# Patient Record
Sex: Female | Born: 1985
Health system: Southern US, Community
[De-identification: ages and names within clinical notes are randomized; demographics above are authoritative.]

## PROBLEM LIST (undated history)

## (undated) ENCOUNTER — Inpatient Hospital Stay: Admission: EM | Payer: Self-pay | Source: Home / Self Care

## (undated) DIAGNOSIS — F419 Anxiety disorder, unspecified: Secondary | ICD-10-CM

## (undated) DIAGNOSIS — I82409 Acute embolism and thrombosis of unspecified deep veins of unspecified lower extremity: Secondary | ICD-10-CM

## (undated) DIAGNOSIS — Z8759 Personal history of other complications of pregnancy, childbirth and the puerperium: Secondary | ICD-10-CM

## (undated) DIAGNOSIS — E282 Polycystic ovarian syndrome: Secondary | ICD-10-CM

## (undated) DIAGNOSIS — N915 Oligomenorrhea, unspecified: Secondary | ICD-10-CM

## (undated) DIAGNOSIS — E669 Obesity, unspecified: Secondary | ICD-10-CM

## (undated) DIAGNOSIS — I10 Essential (primary) hypertension: Secondary | ICD-10-CM

## (undated) DIAGNOSIS — C55 Malignant neoplasm of uterus, part unspecified: Secondary | ICD-10-CM

## (undated) HISTORY — DX: Malignant neoplasm of uterus, part unspecified: C55

## (undated) HISTORY — DX: Polycystic ovarian syndrome: E28.2

## (undated) HISTORY — DX: Oligomenorrhea, unspecified: N91.5

## (undated) HISTORY — DX: Essential (primary) hypertension: I10

## (undated) HISTORY — PX: OTHER SURGICAL HISTORY: SHX169

## (undated) HISTORY — DX: Obesity, unspecified: E66.9

---
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
## 2003-08-28 ENCOUNTER — Other Ambulatory Visit: Admission: RE | Admit: 2003-08-28 | Discharge: 2003-08-28 | Payer: Self-pay | Admitting: Gynecology

## 2004-11-14 ENCOUNTER — Other Ambulatory Visit: Admission: RE | Admit: 2004-11-14 | Discharge: 2004-11-14 | Payer: Self-pay | Admitting: Gynecology

## 2005-11-16 ENCOUNTER — Other Ambulatory Visit: Admission: RE | Admit: 2005-11-16 | Discharge: 2005-11-16 | Payer: Self-pay | Admitting: Gynecology

## 2006-11-23 ENCOUNTER — Other Ambulatory Visit: Admission: RE | Admit: 2006-11-23 | Discharge: 2006-11-23 | Payer: Self-pay | Admitting: Gynecology

## 2007-12-20 ENCOUNTER — Encounter: Payer: Self-pay | Admitting: Women's Health

## 2007-12-20 ENCOUNTER — Other Ambulatory Visit: Admission: RE | Admit: 2007-12-20 | Discharge: 2007-12-20 | Payer: Self-pay | Admitting: Gynecology

## 2007-12-20 ENCOUNTER — Ambulatory Visit: Payer: Self-pay | Admitting: Women's Health

## 2008-09-05 ENCOUNTER — Emergency Department (HOSPITAL_COMMUNITY): Admission: EM | Admit: 2008-09-05 | Discharge: 2008-09-05 | Payer: Self-pay | Admitting: Emergency Medicine

## 2010-09-05 ENCOUNTER — Encounter (INDEPENDENT_AMBULATORY_CARE_PROVIDER_SITE_OTHER): Payer: BC Managed Care – PPO | Admitting: Women's Health

## 2010-09-05 ENCOUNTER — Other Ambulatory Visit (HOSPITAL_COMMUNITY)
Admission: RE | Admit: 2010-09-05 | Discharge: 2010-09-05 | Disposition: A | Discharge status: Home / Self Care | Payer: BC Managed Care – PPO | Source: Ambulatory Visit | Attending: Obstetrics and Gynecology | Admitting: Obstetrics and Gynecology

## 2010-09-05 ENCOUNTER — Other Ambulatory Visit: Payer: Self-pay | Admitting: Women's Health

## 2010-09-05 DIAGNOSIS — N949 Unspecified condition associated with female genital organs and menstrual cycle: Secondary | ICD-10-CM

## 2010-09-05 DIAGNOSIS — N926 Irregular menstruation, unspecified: Secondary | ICD-10-CM

## 2010-09-05 DIAGNOSIS — Z124 Encounter for screening for malignant neoplasm of cervix: Secondary | ICD-10-CM | POA: Insufficient documentation

## 2010-09-05 DIAGNOSIS — Z833 Family history of diabetes mellitus: Secondary | ICD-10-CM

## 2010-09-05 DIAGNOSIS — R823 Hemoglobinuria: Secondary | ICD-10-CM

## 2010-09-05 DIAGNOSIS — Z01419 Encounter for gynecological examination (general) (routine) without abnormal findings: Secondary | ICD-10-CM

## 2010-09-14 ENCOUNTER — Ambulatory Visit: Payer: BC Managed Care – PPO | Admitting: Women's Health

## 2010-09-14 ENCOUNTER — Other Ambulatory Visit: Payer: BC Managed Care – PPO

## 2010-09-20 ENCOUNTER — Ambulatory Visit (INDEPENDENT_AMBULATORY_CARE_PROVIDER_SITE_OTHER): Payer: BC Managed Care – PPO | Admitting: Gynecology

## 2010-09-20 ENCOUNTER — Other Ambulatory Visit: Payer: BC Managed Care – PPO

## 2010-09-20 ENCOUNTER — Other Ambulatory Visit: Payer: Self-pay | Admitting: Gynecology

## 2010-09-20 DIAGNOSIS — N854 Malposition of uterus: Secondary | ICD-10-CM

## 2010-09-20 DIAGNOSIS — E282 Polycystic ovarian syndrome: Secondary | ICD-10-CM

## 2010-09-20 DIAGNOSIS — N949 Unspecified condition associated with female genital organs and menstrual cycle: Secondary | ICD-10-CM

## 2010-09-20 DIAGNOSIS — N926 Irregular menstruation, unspecified: Secondary | ICD-10-CM

## 2010-09-20 DIAGNOSIS — N938 Other specified abnormal uterine and vaginal bleeding: Secondary | ICD-10-CM

## 2012-11-30 LAB — OB RESULTS CONSOLE ABO/RH: RH Type: POSITIVE

## 2012-11-30 LAB — OB RESULTS CONSOLE HEPATITIS B SURFACE ANTIGEN: Hepatitis B Surface Ag: NEGATIVE

## 2012-11-30 LAB — OB RESULTS CONSOLE HIV ANTIBODY (ROUTINE TESTING): HIV: NONREACTIVE

## 2012-11-30 LAB — OB RESULTS CONSOLE GC/CHLAMYDIA
CHLAMYDIA, DNA PROBE: NEGATIVE
Gonorrhea: NEGATIVE

## 2012-11-30 LAB — OB RESULTS CONSOLE ANTIBODY SCREEN: Antibody Screen: NEGATIVE

## 2012-11-30 LAB — OB RESULTS CONSOLE RUBELLA ANTIBODY, IGM: RUBELLA: IMMUNE

## 2012-11-30 LAB — OB RESULTS CONSOLE RPR: RPR: NONREACTIVE

## 2013-03-22 ENCOUNTER — Inpatient Hospital Stay (HOSPITAL_COMMUNITY)
Admission: AD | Admit: 2013-03-22 | Discharge: 2013-03-22 | Disposition: A | Discharge status: Home / Self Care | Payer: 59 | Source: Ambulatory Visit | Attending: Obstetrics & Gynecology | Admitting: Obstetrics & Gynecology

## 2013-03-22 ENCOUNTER — Encounter (HOSPITAL_COMMUNITY): Payer: Self-pay | Admitting: Advanced Practice Midwife

## 2013-03-22 DIAGNOSIS — R42 Dizziness and giddiness: Secondary | ICD-10-CM | POA: Insufficient documentation

## 2013-03-22 DIAGNOSIS — O99891 Other specified diseases and conditions complicating pregnancy: Secondary | ICD-10-CM | POA: Insufficient documentation

## 2013-03-22 DIAGNOSIS — K5289 Other specified noninfective gastroenteritis and colitis: Secondary | ICD-10-CM | POA: Insufficient documentation

## 2013-03-22 DIAGNOSIS — K529 Noninfective gastroenteritis and colitis, unspecified: Secondary | ICD-10-CM

## 2013-03-22 DIAGNOSIS — R002 Palpitations: Secondary | ICD-10-CM | POA: Insufficient documentation

## 2013-03-22 DIAGNOSIS — D696 Thrombocytopenia, unspecified: Secondary | ICD-10-CM | POA: Insufficient documentation

## 2013-03-22 DIAGNOSIS — D689 Coagulation defect, unspecified: Secondary | ICD-10-CM | POA: Insufficient documentation

## 2013-03-22 DIAGNOSIS — R197 Diarrhea, unspecified: Secondary | ICD-10-CM | POA: Insufficient documentation

## 2013-03-22 HISTORY — DX: Anxiety disorder, unspecified: F41.9

## 2013-03-22 LAB — CBC
HCT: 39.3 % (ref 36.0–46.0)
MCH: 27.4 pg (ref 26.0–34.0)
MCHC: 34.1 g/dL (ref 30.0–36.0)
Platelets: 135 10*3/uL — ABNORMAL LOW (ref 150–400)
RBC: 4.89 MIL/uL (ref 3.87–5.11)
RDW: 14 % (ref 11.5–15.5)

## 2013-03-22 MED ORDER — PROMETHAZINE HCL 25 MG PO TABS
25.0000 mg | ORAL_TABLET | Freq: Once | ORAL | Status: AC
  Administered 2013-03-22: 25 mg via ORAL
  Filled 2013-03-22: qty 1

## 2013-03-22 MED ORDER — ONDANSETRON 8 MG PO TBDP: 8.0000 mg | ORAL_TABLET | Freq: Once | ORAL | Status: DC

## 2013-03-22 MED ORDER — PROMETHAZINE HCL 25 MG PO TABS: 25.0000 mg | ORAL_TABLET | Freq: Four times a day (QID) | ORAL | Status: DC | PRN

## 2013-03-22 NOTE — MAU Note (Addendum)
Nausea, vomiting, diarrhea, dizziness, get hot and cold. Symptoms going on for an hour. Pt states feels like heart is racing sometimes and gets dizziness and has chest pressure.

## 2013-03-22 NOTE — MAU Provider Note (Signed)
Chief Complaint:  Emesis During Pregnancy, Diarrhea and Dizziness   First Provider Initiated Contact with Patient 03/22/13 0515     HPI: Nancy Rivera is a 27 y.o. G1P0 at [redacted]w[redacted]d who presents to maternity admissions reporting an episode of feeling rapid heartbeat, difficulty catching her breath and feeling clammy immediately preceding having simultaneous diarrhea and vomiting 1 hour before coming to MAU. Sx resolved immediately afterward. Mild return of rapid heartbeat and clammy feeling in MAU. Resolved w/ relaxation techniques and reassurance.   Hx anxiety attacks w/ some similar Sx.   Past Medical History: Past Medical History  Diagnosis Date  . Obesity   . Oligomenorrhea   . Anxiety     Past obstetric history: OB History  Gravida Para Term Preterm AB SAB TAB Ectopic Multiple Living  1             # Outcome Date GA Lbr Len/2nd Weight Sex Delivery Anes PTL Lv  1 CUR               Past Surgical History: Past Surgical History  Procedure Laterality Date  . Removal of cyst  1985    FROM UNDER LEFT EYEBROW     Family History: Family History  Problem Relation Age of Onset  . Cancer Maternal Grandfather     LUNG  . Cancer Paternal Grandfather     LYMPHOMA    Social History: History  Substance Use Topics  . Smoking status: Former Smoker -- 0.50 packs/day  . Smokeless tobacco: Not on file  . Alcohol Use: No    Allergies: No Known Allergies  Meds:  Prescriptions prior to admission  Medication Sig Dispense Refill  . calcium carbonate (TUMS EX) 750 MG chewable tablet Chew 1 tablet by mouth daily.      . prenatal vitamin w/FE, FA (NATACHEW) 29-1 MG CHEW chewable tablet Chew 1 tablet by mouth daily at 12 noon.      . medroxyPROGESTERone (PROVERA) 10 MG tablet Take 10 mg by mouth daily. X 10 DAYS OF EVERY MOS.         ROS: Pertinent findings in history of present illness. Denies contractions, leakage of fluid or vaginal bleeding. Good fetal movement. Denies fever, chills,  chest pain, SOB, sick contacts, HA, vision changes, epigastric pain.  Physical Exam  Blood pressure 105/62, pulse 81, temperature 97.9 F (36.6 C), resp. rate 20, SpO2 100.00% on RA. Pulse 80's throughout visit.  GENERAL: Well-developed, well-nourished female in no acute distress except for mild anxiety.  HEENT: normocephalic HEART: RRR, no M/R/G  RESP: normal effort. CTAB. Chest NT.  EXTREMITIES: Nontender, no edema NEURO: alert and oriented SPECULUM EXAM: Deferred  FHT: 150 by doppler   Labs: Results for orders placed during the hospital encounter of 03/22/13 (from the past 24 hour(s))  CBC     Status: Abnormal   Collection Time    03/22/13  4:55 AM      Result Value Range   WBC 13.3 (*) 4.0 - 10.5 K/uL   RBC 4.89  3.87 - 5.11 MIL/uL   Hemoglobin 13.4  12.0 - 15.0 g/dL   HCT 16.1  09.6 - 04.5 %   MCV 80.4  78.0 - 100.0 fL   MCH 27.4  26.0 - 34.0 pg   MCHC 34.1  30.0 - 36.0 g/dL   RDW 40.9  81.1 - 91.4 %   Platelets 135 (*) 150 - 400 K/uL    Imaging:  No results found.  MAU Course: Phenergan given.  No further Sx. Tolerating POs well.   Assessment: 1. Gastroenteritis   2. Thrombocytopenia complicating pregnancy, second trimester   3. Heart palpitations    Plan: Discharge home in stable condition. Push fluids.  Relaxation techniques.     Follow-up Information   Follow up with [facility] (as scheduled or as needed if symptoms worsen)    Contact information:   [address] [place] [address] [phone]      Follow up with [facility]. (As needed if symptoms worsen)    Contact information:   [address] [id number] [place] [address] [phone]       Medication List    STOP taking these medications       medroxyPROGESTERone 10 MG tablet  Commonly known as:  PROVERA      TAKE these medications       calcium carbonate 750 MG chewable  tablet  Commonly known as:  TUMS EX  Chew 1 tablet by mouth daily.     prenatal vitamin w/FE, FA 29-1 MG Chew chewable tablet  Chew 1 tablet by mouth daily at 12 noon.     promethazine 25 MG tablet  Commonly known as:  PHENERGAN  Take 1 tablet (25 mg total) by mouth every 6 (six) hours as needed for nausea or vomiting.        [place], [place] 03/22/2013 6:25 AM

## 2013-03-22 NOTE — Progress Notes (Signed)
Ivonne Andrew CNM in earlier and discussed lab results and d/c plan with pt. Written and verbal d/c instructions given and understanding voiced.

## 2013-03-27 NOTE — L&D Delivery Note (Signed)
Delivery Note I was called for the delivery and arrived in 4 minutes to a precipitously delivery attended by faculty MD.  I proceeded with the cutting of the cord after SVD viable female Apgars 9,9 over intact perineum but small vaginal laceration.  Placenta delivered spontaneously intact with 3VC. Repair with 2-0 Chromic with good support and hemostasis noted and R/V exam confirms.  PH art was sent.  [place] cord blood was not done.  Mother and baby were doing well.  EBL 300cc  Louretta Shorten, MD

## 2013-06-26 LAB — OB RESULTS CONSOLE GBS: GBS: NEGATIVE

## 2013-07-11 ENCOUNTER — Inpatient Hospital Stay (HOSPITAL_COMMUNITY)
Admission: AD | Admit: 2013-07-11 | Discharge: 2013-07-14 | DRG: 774 | Disposition: A | Discharge status: Home / Self Care | Payer: 59 | Source: Ambulatory Visit | Attending: Obstetrics and Gynecology | Admitting: Obstetrics and Gynecology

## 2013-07-11 ENCOUNTER — Encounter (HOSPITAL_COMMUNITY): Payer: Self-pay | Admitting: *Deleted

## 2013-07-11 DIAGNOSIS — O9989 Other specified diseases and conditions complicating pregnancy, childbirth and the puerperium: Secondary | ICD-10-CM

## 2013-07-11 DIAGNOSIS — Z6841 Body Mass Index (BMI) 40.0 and over, adult: Secondary | ICD-10-CM

## 2013-07-11 DIAGNOSIS — O99892 Other specified diseases and conditions complicating childbirth: Secondary | ICD-10-CM | POA: Diagnosis present

## 2013-07-11 DIAGNOSIS — IMO0002 Reserved for concepts with insufficient information to code with codable children: Principal | ICD-10-CM | POA: Diagnosis present

## 2013-07-11 DIAGNOSIS — O99344 Other mental disorders complicating childbirth: Secondary | ICD-10-CM | POA: Diagnosis present

## 2013-07-11 DIAGNOSIS — O1490 Unspecified pre-eclampsia, unspecified trimester: Secondary | ICD-10-CM | POA: Diagnosis present

## 2013-07-11 DIAGNOSIS — F341 Dysthymic disorder: Secondary | ICD-10-CM | POA: Diagnosis present

## 2013-07-11 DIAGNOSIS — O99214 Obesity complicating childbirth: Secondary | ICD-10-CM

## 2013-07-11 DIAGNOSIS — Z87891 Personal history of nicotine dependence: Secondary | ICD-10-CM

## 2013-07-11 DIAGNOSIS — Z22330 Carrier of Group B streptococcus: Secondary | ICD-10-CM

## 2013-07-11 DIAGNOSIS — E669 Obesity, unspecified: Secondary | ICD-10-CM | POA: Diagnosis present

## 2013-07-11 HISTORY — DX: Unspecified pre-eclampsia, unspecified trimester: O14.90

## 2013-07-11 LAB — CBC
HEMATOCRIT: 38.8 % (ref 36.0–46.0)
Hemoglobin: 13 g/dL (ref 12.0–15.0)
MCH: 27.9 pg (ref 26.0–34.0)
MCHC: 33.5 g/dL (ref 30.0–36.0)
MCV: 83.3 fL (ref 78.0–100.0)
Platelets: 158 10*3/uL (ref 150–400)
RBC: 4.66 MIL/uL (ref 3.87–5.11)
RDW: 14.7 % (ref 11.5–15.5)
WBC: 12.5 10*3/uL — ABNORMAL HIGH (ref 4.0–10.5)

## 2013-07-11 LAB — COMPREHENSIVE METABOLIC PANEL
ALT: 10 U/L (ref 0–35)
AST: 13 U/L (ref 0–37)
Albumin: 2.4 g/dL — ABNORMAL LOW (ref 3.5–5.2)
Alkaline Phosphatase: 104 U/L (ref 39–117)
BUN: 11 mg/dL (ref 6–23)
CO2: 23 meq/L (ref 19–32)
Calcium: 8.9 mg/dL (ref 8.4–10.5)
Chloride: 103 mEq/L (ref 96–112)
Creatinine, Ser: 1.04 mg/dL (ref 0.50–1.10)
GFR calc non Af Amer: 71 mL/min — ABNORMAL LOW (ref >90)
GFR, EST AFRICAN AMERICAN: 83 mL/min — AB (ref >90)
GLUCOSE: 76 mg/dL (ref 70–99)
Potassium: 4.1 mEq/L (ref 3.7–5.3)
Sodium: 138 mEq/L (ref 137–147)
Total Bilirubin: <0.2 mg/dL — ABNORMAL LOW (ref 0.3–1.2)
Total Protein: 6 g/dL (ref 6.0–8.3)

## 2013-07-11 LAB — URINALYSIS, ROUTINE W REFLEX MICROSCOPIC
Bilirubin Urine: NEGATIVE
GLUCOSE, UA: NEGATIVE mg/dL
Hgb urine dipstick: NEGATIVE
Ketones, ur: NEGATIVE mg/dL
LEUKOCYTES UA: NEGATIVE
Nitrite: NEGATIVE
Specific Gravity, Urine: >1.03 — ABNORMAL HIGH (ref 1.005–1.030)
UROBILINOGEN UA: 0.2 mg/dL (ref 0.0–1.0)
pH: 5.5 (ref 5.0–8.0)

## 2013-07-11 LAB — URIC ACID: URIC ACID, SERUM: 8.3 mg/dL — AB (ref 2.4–7.0)

## 2013-07-11 LAB — SYPHILIS: RPR W/REFLEX TO RPR TITER AND TREPONEMAL ANTIBODIES, TRADITIONAL SCREENING AND DIAGNOSIS ALGORITHM

## 2013-07-11 LAB — TYPE AND SCREEN
ABO/RH(D): A POS
Antibody Screen: NEGATIVE

## 2013-07-11 LAB — URINE MICROSCOPIC-ADD ON

## 2013-07-11 LAB — LACTATE DEHYDROGENASE: LDH: 132 U/L (ref 94–250)

## 2013-07-11 MED ORDER — ACETAMINOPHEN 325 MG PO TABS
650.0000 mg | ORAL_TABLET | ORAL | Status: DC | PRN
  Administered 2013-07-11: 650 mg via ORAL
  Filled 2013-07-11: qty 2

## 2013-07-11 MED ORDER — ZOLPIDEM TARTRATE 5 MG PO TABS
5.0000 mg | ORAL_TABLET | Freq: Every evening | ORAL | Status: DC | PRN
  Administered 2013-07-11: 5 mg via ORAL
  Filled 2013-07-11: qty 1

## 2013-07-11 MED ORDER — OXYTOCIN 40 UNITS IN LACTATED RINGERS INFUSION - SIMPLE MED
62.5000 mL/h | INTRAVENOUS | Status: DC
  Administered 2013-07-12: 62.5 mL/h via INTRAVENOUS

## 2013-07-11 MED ORDER — ONDANSETRON HCL 4 MG/2ML IJ SOLN: 4.0000 mg | Freq: Four times a day (QID) | INTRAMUSCULAR | Status: DC | PRN

## 2013-07-11 MED ORDER — IBUPROFEN 600 MG PO TABS
600.0000 mg | ORAL_TABLET | Freq: Four times a day (QID) | ORAL | Status: DC | PRN
Start: 2013-07-11 — End: 2013-07-12

## 2013-07-11 MED ORDER — OXYCODONE-ACETAMINOPHEN 5-325 MG PO TABS: 1.0000 | ORAL_TABLET | ORAL | Status: DC | PRN

## 2013-07-11 MED ORDER — LACTATED RINGERS IV SOLN
INTRAVENOUS | Status: DC
  Administered 2013-07-11: 17:00:00 via INTRAVENOUS

## 2013-07-11 MED ORDER — LIDOCAINE HCL (PF) 1 % IJ SOLN
30.0000 mL | INTRAMUSCULAR | Status: DC | PRN
  Administered 2013-07-12: 30 mL via SUBCUTANEOUS
  Filled 2013-07-11: qty 30

## 2013-07-11 MED ORDER — OXYTOCIN BOLUS FROM INFUSION: 500.0000 mL | INTRAVENOUS | Status: DC

## 2013-07-11 MED ORDER — MISOPROSTOL 25 MCG QUARTER TABLET
25.0000 ug | ORAL_TABLET | ORAL | Status: DC
  Administered 2013-07-11 – 2013-07-12 (×3): 25 ug via VAGINAL
  Filled 2013-07-11: qty 1
  Filled 2013-07-11: qty 0.25
  Filled 2013-07-11 (×3): qty 1
  Filled 2013-07-11 (×2): qty 0.25
  Filled 2013-07-11: qty 1

## 2013-07-11 MED ORDER — SODIUM CHLORIDE 0.9 % IV SOLN
2.0000 g | Freq: Once | INTRAVENOUS | Status: AC
  Administered 2013-07-11: 2 g via INTRAVENOUS
  Filled 2013-07-11: qty 2000

## 2013-07-11 MED ORDER — CITRIC ACID-SODIUM CITRATE 334-500 MG/5ML PO SOLN: 30.0000 mL | ORAL | Status: DC | PRN

## 2013-07-11 MED ORDER — LACTATED RINGERS IV SOLN: 500.0000 mL | INTRAVENOUS | Status: DC | PRN

## 2013-07-11 NOTE — H&P (Signed)
Nancy Rivera is a 28 y.o. female presenting for IOL due to preeclampsia. Seen in office today and sent for elevated BPs and 4+ proteinuria.  presents to maternity admissions sent from the office with elevated BP and proteinuria. She reports a mild h/a this morning which resolved with Tylenol. She denies epigastric pain or visual disturbances. She reports elevated BP 2 weeks ago with proteinuria in the office that resolved until today. She reports good fetal movement, denies LOF, vaginal bleeding, vaginal itching/burning, urinary symptoms, h/a, dizziness, n/v, or fever/chills.  . History OB History   Grav Para Term Preterm Abortions TAB SAB Ect Mult Living   1              Past Medical History  Diagnosis Date  . Obesity   . Oligomenorrhea   . Anxiety    Past Surgical History  Procedure Laterality Date  . Removal of cyst  1985    FROM UNDER LEFT EYEBROW   Family History: family history includes Cancer in her maternal grandfather and paternal grandfather. Social History:  reports that she has quit smoking. She does not have any smokeless tobacco history on file. She reports that she does not drink alcohol or use illicit drugs.   Prenatal Transfer Tool  Maternal Diabetes: No Genetic Screening: Normal Maternal Ultrasounds/Referrals: Normal Fetal Ultrasounds or other Referrals:  None Maternal Substance Abuse:  No Significant Maternal Medications:  None Significant Maternal Lab Results:  None Other Comments:  None  ROS    Blood pressure 145/90, pulse 69, temperature 98.1 F (36.7 C), temperature source Oral, resp. rate 20, height 5\' 9"  (1.753 m), weight 136.986 kg (302 lb), SpO2 99.00%. Exam Physical Exam   1/50/high DTRs 1/4 no clonus Prenatal labs: ABO, Rh:   Antibody:   Rubella:   RPR:    HBsAg:    HIV:    GBS: Negative (04/02 0000)   Assessment/Plan: IUP at term with Mild preeclampsia.  Plan IOL No CNS sxs and normal labs.  Plan cytotec tonight and pitocin  tomorrow GBS +   Luz Lex 07/11/2013, 5:23 PM

## 2013-07-11 NOTE — MAU Provider Note (Signed)
Chief Complaint:  Hypertension and Proteinuria   First Provider Initiated Contact with Patient 07/11/13 1253      HPI: Nancy Rivera is a 28 y.o. G1P0 at [redacted]w[redacted]d who presents to maternity admissions sent from the office with elevated BP and proteinuria.  She reports a mild h/a this morning which resolved with Tylenol.  She denies epigastric pain or visual disturbances.  She reports elevated BP 2 weeks ago with proteinuria in the office that resolved until today.  She reports good fetal movement, denies LOF, vaginal bleeding, vaginal itching/burning, urinary symptoms, h/a, dizziness, n/v, or fever/chills.     Past Medical History: Past Medical History  Diagnosis Date  . Obesity   . Oligomenorrhea   . Anxiety     Past obstetric history: OB History  Gravida Para Term Preterm AB SAB TAB Ectopic Multiple Living  1             # Outcome Date GA Lbr Len/2nd Weight Sex Delivery Anes PTL Lv  1 CUR               Past Surgical History: Past Surgical History  Procedure Laterality Date  . Removal of cyst  1985    FROM UNDER LEFT EYEBROW    Family History: Family History  Problem Relation Age of Onset  . Cancer Maternal Grandfather     LUNG  . Cancer Paternal Grandfather     LYMPHOMA    Social History: History  Substance Use Topics  . Smoking status: Former Smoker -- 0.50 packs/day  . Smokeless tobacco: Not on file  . Alcohol Use: No    Allergies: No Known Allergies  Meds:  Prescriptions prior to admission  Medication Sig Dispense Refill  . acetaminophen (TYLENOL) 500 MG tablet Take 1,000 mg by mouth every 6 (six) hours as needed for mild pain or headache.      . calcium carbonate (TUMS EX) 750 MG chewable tablet Chew 2 tablets by mouth daily as needed for heartburn.       . Prenatal Vit-Fe Fumarate-FA (PRENATAL MULTIVITAMIN) TABS tablet Take 1 tablet by mouth daily at 12 noon.        ROS: Pertinent findings in history of present illness.  Physical Exam  Blood pressure  140/92, pulse 82, temperature 99.5 F (37.5 C), temperature source Oral, resp. rate 20, height 5\' 9"  (1.753 m), weight 302 lb 9.6 oz (137.258 kg), SpO2 99.00%. GENERAL: Well-developed, well-nourished female in no acute distress.  HEENT: normocephalic HEART: normal rate RESP: normal effort ABDOMEN: Soft, non-tender, gravid appropriate for gestational age EXTREMITIES: Nontender, no edema NEURO: alert and oriented    FHT:  Baseline 135, moderate variability, accelerations present, no decelerations Contractions: occasional   Labs: Results for orders placed during the hospital encounter of 07/11/13 (from the past 24 hour(s))  URINALYSIS, ROUTINE W REFLEX MICROSCOPIC     Status: Abnormal   Collection Time    07/11/13 11:50 AM      Result Value Ref Range   Color, Urine AMBER (*) YELLOW   APPearance CLEAR  CLEAR   Specific Gravity, Urine >1.030 (*) 1.005 - 1.030   pH 5.5  5.0 - 8.0   Glucose, UA NEGATIVE  NEGATIVE mg/dL   Hgb urine dipstick NEGATIVE  NEGATIVE   Bilirubin Urine NEGATIVE  NEGATIVE   Ketones, ur NEGATIVE  NEGATIVE mg/dL   Protein, ur >300 (*) NEGATIVE mg/dL   Urobilinogen, UA 0.2  0.0 - 1.0 mg/dL   Nitrite NEGATIVE  NEGATIVE  Leukocytes, UA NEGATIVE  NEGATIVE  URINE MICROSCOPIC-ADD ON     Status: None   Collection Time    07/11/13 11:50 AM      Result Value Ref Range   Squamous Epithelial / LPF RARE  RARE   WBC, UA 0-2  <3 WBC/hpf   Bacteria, UA RARE  RARE   Urine-Other MUCOUS PRESENT    CBC     Status: Abnormal   Collection Time    07/11/13 12:55 PM      Result Value Ref Range   WBC 12.5 (*) 4.0 - 10.5 K/uL   RBC 4.66  3.87 - 5.11 MIL/uL   Hemoglobin 13.0  12.0 - 15.0 g/dL   HCT 38.8  36.0 - 46.0 %   MCV 83.3  78.0 - 100.0 fL   MCH 27.9  26.0 - 34.0 pg   MCHC 33.5  30.0 - 36.0 g/dL   RDW 14.7  11.5 - 15.5 %   Platelets 158  150 - 400 K/uL  COMPREHENSIVE METABOLIC PANEL     Status: Abnormal   Collection Time    07/11/13 12:55 PM      Result Value Ref  Range   Sodium 138  137 - 147 mEq/L   Potassium 4.1  3.7 - 5.3 mEq/L   Chloride 103  96 - 112 mEq/L   CO2 23  19 - 32 mEq/L   Glucose, Bld 76  70 - 99 mg/dL   BUN 11  6 - 23 mg/dL   Creatinine, Ser 1.04  0.50 - 1.10 mg/dL   Calcium 8.9  8.4 - 10.5 mg/dL   Total Protein 6.0  6.0 - 8.3 g/dL   Albumin 2.4 (*) 3.5 - 5.2 g/dL   AST 13  0 - 37 U/L   ALT 10  0 - 35 U/L   Alkaline Phosphatase 104  39 - 117 U/L   Total Bilirubin <0.2 (*) 0.3 - 1.2 mg/dL   GFR calc non Af Amer 71 (*) >90 mL/min   GFR calc Af Amer 83 (*) >90 mL/min  URIC ACID     Status: Abnormal   Collection Time    07/11/13 12:55 PM      Result Value Ref Range   Uric Acid, Serum 8.3 (*) 2.4 - 7.0 mg/dL  LACTATE DEHYDROGENASE     Status: None   Collection Time    07/11/13 12:55 PM      Result Value Ref Range   LDH 132  94 - 250 U/L     Assessment: 1. Preeclampsia     Plan: Consult Dr Corinna Capra Admit for IOL  Discussed induction with pt, options, questions answered    Medication List    ASK your doctor about these medications       acetaminophen 500 MG tablet  Commonly known as:  TYLENOL  Take 1,000 mg by mouth every 6 (six) hours as needed for mild pain or headache.     calcium carbonate 750 MG chewable tablet  Commonly known as:  TUMS EX  Chew 2 tablets by mouth daily as needed for heartburn.     prenatal multivitamin Tabs tablet  Take 1 tablet by mouth daily at 12 noon.        Fatima Blank Certified Nurse-Midwife 07/11/2013 3:43 PM

## 2013-07-11 NOTE — MAU Note (Signed)
Patient was seen in the office today and had elevated blood pressure and protein in the urine. Has heartburn pain and headaches off and on. Nausea, no vomiting. Reports fetal movement. Denies contractions, bleeding or leaking.

## 2013-07-12 ENCOUNTER — Encounter (HOSPITAL_COMMUNITY): Payer: Self-pay | Admitting: *Deleted

## 2013-07-12 ENCOUNTER — Encounter (HOSPITAL_COMMUNITY): Payer: 59 | Admitting: Anesthesiology

## 2013-07-12 ENCOUNTER — Inpatient Hospital Stay (HOSPITAL_COMMUNITY): Payer: 59 | Admitting: Anesthesiology

## 2013-07-12 LAB — CBC
HCT: 38.4 % (ref 36.0–46.0)
HEMATOCRIT: 40 % (ref 36.0–46.0)
Hemoglobin: 13.9 g/dL (ref 12.0–15.0)
Hemoglobin: 13.3 g/dL (ref 12.0–15.0)
MCH: 28.7 pg (ref 26.0–34.0)
MCH: 28.7 pg (ref 26.0–34.0)
MCHC: 34.8 g/dL (ref 30.0–36.0)
MCHC: 34.6 g/dL (ref 30.0–36.0)
MCV: 82.6 fL (ref 78.0–100.0)
MCV: 82.9 fL (ref 78.0–100.0)
PLATELETS: 175 10*3/uL (ref 150–400)
Platelets: 173 10*3/uL (ref 150–400)
RBC: 4.84 MIL/uL (ref 3.87–5.11)
RBC: 4.63 MIL/uL (ref 3.87–5.11)
RDW: 14.5 % (ref 11.5–15.5)
RDW: 14.5 % (ref 11.5–15.5)
WBC: 14.2 10*3/uL — ABNORMAL HIGH (ref 4.0–10.5)
WBC: 16.9 10*3/uL — ABNORMAL HIGH (ref 4.0–10.5)

## 2013-07-12 LAB — COMPREHENSIVE METABOLIC PANEL
ALT: 12 U/L (ref 0–35)
AST: 14 U/L (ref 0–37)
Albumin: 2.4 g/dL — ABNORMAL LOW (ref 3.5–5.2)
Alkaline Phosphatase: 125 U/L — ABNORMAL HIGH (ref 39–117)
BUN: 10 mg/dL (ref 6–23)
CO2: 19 mEq/L (ref 19–32)
Calcium: 9 mg/dL (ref 8.4–10.5)
Chloride: 101 mEq/L (ref 96–112)
Creatinine, Ser: 0.95 mg/dL (ref 0.50–1.10)
GFR calc Af Amer: >90 mL/min (ref >90)
GFR calc non Af Amer: 80 mL/min — ABNORMAL LOW (ref >90)
GLUCOSE: 87 mg/dL (ref 70–99)
POTASSIUM: 4.3 meq/L (ref 3.7–5.3)
SODIUM: 134 meq/L — AB (ref 137–147)
Total Bilirubin: 0.4 mg/dL (ref 0.3–1.2)
Total Protein: 5.6 g/dL — ABNORMAL LOW (ref 6.0–8.3)

## 2013-07-12 LAB — URIC ACID: Uric Acid, Serum: 8.7 mg/dL — ABNORMAL HIGH (ref 2.4–7.0)

## 2013-07-12 LAB — ABO/RH: ABO/RH(D): A POS

## 2013-07-12 LAB — LACTATE DEHYDROGENASE: LDH: 162 U/L (ref 94–250)

## 2013-07-12 MED ORDER — FENTANYL 2.5 MCG/ML BUPIVACAINE 1/10 % EPIDURAL INFUSION (WH - ANES)
14.0000 mL/h | INTRAMUSCULAR | Status: DC | PRN
  Filled 2013-07-12: qty 125

## 2013-07-12 MED ORDER — OXYCODONE-ACETAMINOPHEN 5-325 MG PO TABS
1.0000 | ORAL_TABLET | ORAL | Status: DC | PRN
  Administered 2013-07-12 – 2013-07-14 (×5): 1 via ORAL
  Filled 2013-07-12 (×4): qty 1
  Filled 2013-07-12: qty 2

## 2013-07-12 MED ORDER — PROMETHAZINE HCL 25 MG/ML IJ SOLN
12.5000 mg | Freq: Four times a day (QID) | INTRAMUSCULAR | Status: DC | PRN
  Administered 2013-07-12: 12.5 mg via INTRAVENOUS
  Filled 2013-07-12: qty 1

## 2013-07-12 MED ORDER — OXYTOCIN 40 UNITS IN LACTATED RINGERS INFUSION - SIMPLE MED
1.0000 m[IU]/min | INTRAVENOUS | Status: DC
  Administered 2013-07-12: 2 m[IU]/min via INTRAVENOUS
  Filled 2013-07-12: qty 1000

## 2013-07-12 MED ORDER — ZOLPIDEM TARTRATE 5 MG PO TABS: 5.0000 mg | ORAL_TABLET | Freq: Every evening | ORAL | Status: DC | PRN

## 2013-07-12 MED ORDER — TETANUS-DIPHTH-ACELL PERTUSSIS 5-2.5-18.5 LF-MCG/0.5 IM SUSP: 0.5000 mL | Freq: Once | INTRAMUSCULAR | Status: DC

## 2013-07-12 MED ORDER — TERBUTALINE SULFATE 1 MG/ML IJ SOLN: 0.2500 mg | Freq: Once | INTRAMUSCULAR | Status: DC | PRN

## 2013-07-12 MED ORDER — BENZOCAINE-MENTHOL 20-0.5 % EX AERO
1.0000 "application " | INHALATION_SPRAY | CUTANEOUS | Status: DC | PRN
  Administered 2013-07-12: 1 via TOPICAL
  Filled 2013-07-12: qty 56

## 2013-07-12 MED ORDER — IBUPROFEN 600 MG PO TABS
600.0000 mg | ORAL_TABLET | Freq: Four times a day (QID) | ORAL | Status: DC
  Administered 2013-07-12 – 2013-07-14 (×6): 600 mg via ORAL
  Filled 2013-07-12 (×7): qty 1

## 2013-07-12 MED ORDER — ONDANSETRON HCL 4 MG PO TABS: 4.0000 mg | ORAL_TABLET | ORAL | Status: DC | PRN

## 2013-07-12 MED ORDER — PHENYLEPHRINE 40 MCG/ML (10ML) SYRINGE FOR IV PUSH (FOR BLOOD PRESSURE SUPPORT)
80.0000 ug | PREFILLED_SYRINGE | INTRAVENOUS | Status: DC | PRN
  Filled 2013-07-12: qty 2

## 2013-07-12 MED ORDER — LIDOCAINE HCL (PF) 1 % IJ SOLN
INTRAMUSCULAR | Status: DC | PRN
  Administered 2013-07-12 (×2): 4 mL

## 2013-07-12 MED ORDER — PRENATAL MULTIVITAMIN CH
1.0000 | ORAL_TABLET | Freq: Every day | ORAL | Status: DC
  Administered 2013-07-13: 1 via ORAL
  Filled 2013-07-12: qty 1

## 2013-07-12 MED ORDER — SENNOSIDES-DOCUSATE SODIUM 8.6-50 MG PO TABS
2.0000 | ORAL_TABLET | ORAL | Status: DC
  Administered 2013-07-13 – 2013-07-14 (×2): 2 via ORAL
  Filled 2013-07-12 (×3): qty 2

## 2013-07-12 MED ORDER — DIPHENHYDRAMINE HCL 50 MG/ML IJ SOLN: 12.5000 mg | INTRAMUSCULAR | Status: DC | PRN

## 2013-07-12 MED ORDER — EPHEDRINE 5 MG/ML INJ
10.0000 mg | INTRAVENOUS | Status: DC | PRN
  Filled 2013-07-12: qty 2
  Filled 2013-07-12: qty 4

## 2013-07-12 MED ORDER — LACTATED RINGERS IV SOLN
500.0000 mL | Freq: Once | INTRAVENOUS | Status: AC
  Administered 2013-07-12: 500 mL via INTRAVENOUS

## 2013-07-12 MED ORDER — LANOLIN HYDROUS EX OINT: TOPICAL_OINTMENT | CUTANEOUS | Status: DC | PRN

## 2013-07-12 MED ORDER — PHENYLEPHRINE 40 MCG/ML (10ML) SYRINGE FOR IV PUSH (FOR BLOOD PRESSURE SUPPORT)
80.0000 ug | PREFILLED_SYRINGE | INTRAVENOUS | Status: DC | PRN
  Filled 2013-07-12: qty 2
  Filled 2013-07-12: qty 10

## 2013-07-12 MED ORDER — ONDANSETRON HCL 4 MG/2ML IJ SOLN: 4.0000 mg | INTRAMUSCULAR | Status: DC | PRN

## 2013-07-12 MED ORDER — MEASLES, MUMPS & RUBELLA VAC ~~LOC~~ INJ: 0.5000 mL | INJECTION | Freq: Once | SUBCUTANEOUS | Status: DC

## 2013-07-12 MED ORDER — DIPHENHYDRAMINE HCL 25 MG PO CAPS: 25.0000 mg | ORAL_CAPSULE | Freq: Four times a day (QID) | ORAL | Status: DC | PRN

## 2013-07-12 MED ORDER — BUTORPHANOL TARTRATE 1 MG/ML IJ SOLN
1.0000 mg | Freq: Once | INTRAMUSCULAR | Status: AC
  Administered 2013-07-12: 1 mg via INTRAVENOUS
  Filled 2013-07-12: qty 1

## 2013-07-12 MED ORDER — WITCH HAZEL-GLYCERIN EX PADS: 1.0000 "application " | MEDICATED_PAD | CUTANEOUS | Status: DC | PRN

## 2013-07-12 MED ORDER — MEDROXYPROGESTERONE ACETATE 150 MG/ML IM SUSP: 150.0000 mg | INTRAMUSCULAR | Status: DC | PRN

## 2013-07-12 MED ORDER — SODIUM CHLORIDE 0.9 % IV SOLN
2.0000 g | Freq: Four times a day (QID) | INTRAVENOUS | Status: DC
  Administered 2013-07-12 (×2): 2 g via INTRAVENOUS
  Filled 2013-07-12 (×4): qty 2000

## 2013-07-12 MED ORDER — EPHEDRINE 5 MG/ML INJ
10.0000 mg | INTRAVENOUS | Status: DC | PRN
  Filled 2013-07-12: qty 2

## 2013-07-12 MED ORDER — FENTANYL 2.5 MCG/ML BUPIVACAINE 1/10 % EPIDURAL INFUSION (WH - ANES)
INTRAMUSCULAR | Status: DC | PRN
  Administered 2013-07-12: 14 mL/h via EPIDURAL

## 2013-07-12 MED ORDER — FENTANYL 2.5 MCG/ML BUPIVACAINE 1/10 % EPIDURAL INFUSION (WH - ANES): 14.0000 mL/h | INTRAMUSCULAR | Status: DC | PRN

## 2013-07-12 MED ORDER — SIMETHICONE 80 MG PO CHEW: 80.0000 mg | CHEWABLE_TABLET | ORAL | Status: DC | PRN

## 2013-07-12 MED ORDER — DIBUCAINE 1 % RE OINT: 1.0000 "application " | TOPICAL_OINTMENT | RECTAL | Status: DC | PRN

## 2013-07-12 NOTE — Anesthesia Preprocedure Evaluation (Signed)
Anesthesia Evaluation  Patient identified by MRN, date of birth, ID band Patient awake    Reviewed: Allergy & Precautions, H&P , NPO status , Patient's Chart, lab work & pertinent test results  Airway Mallampati: III TM Distance: >3 FB Neck ROM: Full    Dental  (+) Dental Advisory Given   Pulmonary former smoker,          Cardiovascular hypertension, Rhythm:Regular     Neuro/Psych Anxiety negative neurological ROS  negative psych ROS   GI/Hepatic negative GI ROS, Neg liver ROS,   Endo/Other  Morbid obesity  Renal/GU negative Renal ROS     Musculoskeletal negative musculoskeletal ROS (+)   Abdominal   Peds  Hematology negative hematology ROS (+)   Anesthesia Other Findings   Reproductive/Obstetrics (+) Pregnancy                           Anesthesia Physical Anesthesia Plan  ASA: III  Anesthesia Plan: Epidural   Post-op Pain Management:    Induction:   Airway Management Planned:   Additional Equipment:   Intra-op Plan:   Post-operative Plan:   Informed Consent: I have reviewed the patients History and Physical, chart, labs and discussed the procedure including the risks, benefits and alternatives for the proposed anesthesia with the patient or authorized representative who has indicated his/her understanding and acceptance.     Plan Discussed with:   Anesthesia Plan Comments:         Anesthesia Quick Evaluation

## 2013-07-12 NOTE — Anesthesia Procedure Notes (Signed)
Epidural Patient location during procedure: OB Start time: 07/12/2013 12:36 PM End time: 07/12/2013 12:51 PM  Staffing Anesthesiologist: Nolon Nations R Performed by: anesthesiologist   Preanesthetic Checklist Completed: patient identified, pre-op evaluation, timeout performed, IV checked, risks and benefits discussed and monitors and equipment checked  Epidural Patient position: sitting Prep: site prepped and draped and DuraPrep Patient monitoring: heart rate Approach: midline Location: L2-L3 Injection technique: LOR air and LOR saline  Needle:  Needle type: Tuohy  Needle gauge: 17 G Needle length: 9 cm Needle insertion depth: 7 cm Catheter type: closed end flexible Catheter size: 19 Gauge Catheter at skin depth: 13 cm Test dose: negative  Assessment Sensory level: T8 Events: blood not aspirated, injection not painful, no injection resistance, negative IV test and no paresthesia  Additional Notes Reason for block:procedure for pain

## 2013-07-12 NOTE — Progress Notes (Signed)
Patient ID: Nancy Rivera, female   DOB: 1985-11-17, 28 y.o.   MRN: [id number] Pt without PIH sxs VSSAF  BPs 140s /90s FHR 140s Cat 1 Ctxs irregular mild  Cx 2/50/-2  AROM clear  DTRS 2/4 no clonuc  PIH - Stable Continue IOL.  Recheck labs Anticpate SVD

## 2013-07-12 NOTE — Lactation Note (Signed)
This note was copied from the chart of Girl Corey Caulfield. Lactation Consultation Note Initial visit at 4 hours of age.  Baby is in moms arms attempting latch.  Place baby STS in cross cradle, baby very fussy.  Wet diaper changed and moved to football hold.  Baby latches after a few attempts and has several strong suckling bursts and then needs stimulation to keep suckling.  Baby fussy and comes off and lays on moms breast asleep.  Baby awakens after a few minutes and re-latches without assistance.  Encouraged mom to hold breast compressed for feeding.  Baby continues to suck for a few minutes.  [place] LC resources given and discussed.  Mom is receptive to education and will call for assist as needed.      Patient Name: Girl Boyd Litaker QQPYP'P Date: 07/12/2013 Reason for consult: Initial assessment   Maternal Data Has patient been taught Hand Expression?: Yes Does the patient have breastfeeding experience prior to this delivery?: No  Feeding Feeding Type: Breast Fed Length of feed: 10 min  LATCH Score/Interventions Latch: Repeated attempts needed to sustain latch, nipple held in mouth throughout feeding, stimulation needed to elicit sucking reflex. Intervention(s): Breast compression;Assist with latch;Adjust position;Breast massage  Audible Swallowing: A few with stimulation Intervention(s): Hand expression;Skin to skin;Alternate breast massage  Type of Nipple: Everted at rest and after stimulation  Comfort (Breast/Nipple): Soft / non-tender     Hold (Positioning): Assistance needed to correctly position infant at breast and maintain latch. Intervention(s): Position options;Skin to skin;Support Pillows;Breastfeeding basics reviewed  LATCH Score: 7  Lactation Tools Discussed/Used     Consult Status Consult Status: Follow-up Date: 07/13/13 Follow-up type: In-patient    Justine Null Hagop Mccollam 07/12/2013, 10:26 PM

## 2013-07-13 LAB — CBC
HCT: 36.7 % (ref 36.0–46.0)
HEMOGLOBIN: 12.4 g/dL (ref 12.0–15.0)
MCH: 28.1 pg (ref 26.0–34.0)
MCHC: 33.8 g/dL (ref 30.0–36.0)
MCV: 83 fL (ref 78.0–100.0)
PLATELETS: 168 10*3/uL (ref 150–400)
RBC: 4.42 MIL/uL (ref 3.87–5.11)
RDW: 14.7 % (ref 11.5–15.5)
WBC: 17.1 10*3/uL — ABNORMAL HIGH (ref 4.0–10.5)

## 2013-07-13 NOTE — Anesthesia Postprocedure Evaluation (Signed)
Anesthesia Post Note  Patient: Nancy Rivera  Procedure(s) Performed: * No procedures listed *  Anesthesia type: Epidural  Patient location: Mother/Baby  Post pain: Pain level controlled  Post assessment: Post-op Vital signs reviewed  Last Vitals:  Filed Vitals:   07/13/13 0556  BP: 121/82  Pulse: 76  Temp: 36.6 C  Resp: 18    Post vital signs: Reviewed  Level of consciousness:alert  Complications: No apparent anesthesia complications

## 2013-07-13 NOTE — Progress Notes (Signed)
Clinical Social Work Department PSYCHOSOCIAL ASSESSMENT - MATERNAL/CHILD 07/13/2013  Patient:  Nancy Rivera, Nancy Rivera  Account Number:  [id number]  Admit Date:  07/11/2013  Ardine Eng Name:   Soyla Murphy    Clinical Social Worker:  Chantille Navarrete, LCSW   Date/Time:  07/13/2013 02:00 PM  Date Referred:  07/13/2013      Referred reason  Depression/Anxiety   Other referral source:    I:  FAMILY / [place] legal guardian:  PARENT  Guardian - Name Guardian - Age Guardian - Address  [place],Angelamarie [address], [place] [address]  [name]  same as above   Other household support members/support persons Other support:    II  PSYCHOSOCIAL DATA Information Source:    Occupational hygienist Employment:   Museum/gallery curator resources:  Multimedia programmer If [place]:    School / Grade:   Maternity Care Coordinator / Child Services Coordination / Early Interventions:  Cultural issues impacting care:    III  STRENGTHS Strengths  Supportive family/friends  Home prepared for Child (including basic supplies)  Adequate Resources   Strength comment:    IV  RISK FACTORS AND CURRENT PROBLEMS Current Problem:       V  SOCIAL WORK ASSESSMENT Acknowledged order for Social Work consult to assess mother's history of anxiety.  Parents are married and have no other dependents.  Parents are employed.   Mother reports extensive family support.  She reports hx of anxiety and states that she is able to manage it without medication.   Discussed  PP Depression and provided her with information on where to get help if needed.  Mother reports no current symptoms of depression or anxiety.  She also denies any hx of substance abuse.  No acute social concerns noted at this time.   Informed parents of CSW availability.      VI SOCIAL WORK PLAN Social Work Plan  No Further Intervention Required / No Barriers to Discharge

## 2013-07-13 NOTE — Progress Notes (Signed)
Post Partum Day 1 Subjective: no complaints, up ad lib, voiding and tolerating PO  Objective: Blood pressure 135/87, pulse 73, temperature 98.4 F (36.9 C), temperature source Axillary, resp. rate 18, height 5\' 9"  (1.753 m), weight 136.986 kg (302 lb), SpO2 97.00%, unknown if currently breastfeeding.  Physical Exam:  General: alert, cooperative, appears stated age and no distress Lochia: appropriate Uterine Fundus: firm Incision: healing well DVT Evaluation: No evidence of DVT seen on physical exam.   Recent Labs  07/12/13 1830 07/13/13 0540  HGB 13.3 12.4  HCT 38.4 36.7    Assessment/Plan: Plan for discharge tomorrow and Breastfeeding BPs stable.  Labs wnl   LOS: 2 days   Luz Lex 07/13/2013, 10:11 AM

## 2013-07-13 NOTE — Lactation Note (Signed)
This note was copied from the chart of Nancy Kimbra Marcelino. Lactation Consultation Note Follow up visit at 24 hours of age.  Mom reports good feedings and denies problems or concerns.  9 feedings, 4 voids, 5 stools in the past 24 hours, with latch scores of 8-9.  Encouraged mom to call for assist as needed.    Patient Name: Nancy Rivera EHUDJ'S Date: 07/13/2013 Reason for consult: Follow-up assessment   Maternal Data    Feeding Feeding Type: Breast Fed Length of feed: 60 min  LATCH Score/Interventions Latch: Grasps breast easily, tongue down, lips flanged, rhythmical sucking.  Audible Swallowing: A few with stimulation  Type of Nipple: Everted at rest and after stimulation  Comfort (Breast/Nipple): Soft / non-tender     Hold (Positioning): No assistance needed to correctly position infant at breast. (Mom says this feeding is the best latch so far.) Intervention(s): Breastfeeding basics reviewed  LATCH Score: 9  Lactation Tools Discussed/Used     Consult Status Consult Status: Follow-up Date: 07/14/13 Follow-up type: In-patient    Nancy Rivera 07/13/2013, 5:57 PM

## 2013-07-14 MED ORDER — OXYCODONE-ACETAMINOPHEN 5-325 MG PO TABS: 1.0000 | ORAL_TABLET | ORAL | Status: DC | PRN

## 2013-07-14 MED ORDER — IBUPROFEN 600 MG PO TABS: 600.0000 mg | ORAL_TABLET | Freq: Four times a day (QID) | ORAL | Status: DC

## 2013-07-14 NOTE — Lactation Note (Signed)
This note was copied from the chart of Nancy Rivera. Lactation Consultation Note  Patient Name: Nancy Rivera CWCBJ'S Date: 07/14/2013 Reason for consult: Follow-up assessment Mom reports baby is nursing well, denies concerns. Engorgement care reviewed if needed. Advised of OP services and support group.   Maternal Data    Feeding Feeding Type: Breast Fed Length of feed: 20 min  LATCH Score/Interventions                      Lactation Tools Discussed/Used     Consult Status Consult Status: Complete Date: 07/14/13 Follow-up type: In-patient    Georga Kaufmann Kerline Trahan 07/14/2013, 10:22 AM

## 2013-07-14 NOTE — Discharge Summary (Signed)
Obstetric Discharge Summary Reason for Admission: induction of labor Prenatal Procedures: ultrasound Intrapartum Procedures: spontaneous vaginal delivery Postpartum Procedures: none Complications-Operative and Postpartum: vaginal laceration Hemoglobin  Date Value Ref Range Status  07/13/2013 12.4  12.0 - 15.0 g/dL Final     HCT  Date Value Ref Range Status  07/13/2013 36.7  36.0 - 46.0 % Final    Physical Exam:  General: alert and cooperative Lochia: appropriate Uterine Fundus: firm Incision: healing well DVT Evaluation: No evidence of DVT seen on physical exam. Negative Homan's sign. No cords or calf tenderness. Calf/Ankle edema is present.  Discharge Diagnoses: Term Pregnancy-delivered  Discharge Information: Date: 07/14/2013 Activity: pelvic rest Diet: routine Medications: PNV, Ibuprofen and Percocet Condition: stable Instructions: refer to practice specific booklet Discharge to: home   Newborn Data: Live born female  Birth Weight: 6 lb 0.3 oz (2730 g) APGAR: 9, 9  Home with mother.  Nancy Rivera 07/14/2013, 8:08 AM

## 2013-07-14 NOTE — Progress Notes (Signed)
Ur chart review completed post discharge.  

## 2013-07-24 ENCOUNTER — Encounter (HOSPITAL_COMMUNITY): Payer: Self-pay | Admitting: *Deleted

## 2013-07-24 ENCOUNTER — Inpatient Hospital Stay (HOSPITAL_COMMUNITY)
Admission: AD | Admit: 2013-07-24 | Discharge: 2013-07-24 | Disposition: A | Discharge status: Home / Self Care | Payer: 59 | Source: Ambulatory Visit | Attending: Obstetrics and Gynecology | Admitting: Obstetrics and Gynecology

## 2013-07-24 DIAGNOSIS — O9089 Other complications of the puerperium, not elsewhere classified: Secondary | ICD-10-CM

## 2013-07-24 DIAGNOSIS — O99893 Other specified diseases and conditions complicating puerperium: Secondary | ICD-10-CM | POA: Insufficient documentation

## 2013-07-24 DIAGNOSIS — R109 Unspecified abdominal pain: Secondary | ICD-10-CM | POA: Insufficient documentation

## 2013-07-24 DIAGNOSIS — R519 Headache, unspecified: Secondary | ICD-10-CM

## 2013-07-24 DIAGNOSIS — O9989 Other specified diseases and conditions complicating pregnancy, childbirth and the puerperium: Principal | ICD-10-CM

## 2013-07-24 DIAGNOSIS — Z87891 Personal history of nicotine dependence: Secondary | ICD-10-CM | POA: Insufficient documentation

## 2013-07-24 DIAGNOSIS — O26899 Other specified pregnancy related conditions, unspecified trimester: Secondary | ICD-10-CM

## 2013-07-24 DIAGNOSIS — O909 Complication of the puerperium, unspecified: Secondary | ICD-10-CM

## 2013-07-24 DIAGNOSIS — R51 Headache: Secondary | ICD-10-CM | POA: Insufficient documentation

## 2013-07-24 DIAGNOSIS — R030 Elevated blood-pressure reading, without diagnosis of hypertension: Secondary | ICD-10-CM | POA: Insufficient documentation

## 2013-07-24 LAB — URINE MICROSCOPIC-ADD ON

## 2013-07-24 LAB — URINALYSIS, ROUTINE W REFLEX MICROSCOPIC
BILIRUBIN URINE: NEGATIVE
Glucose, UA: NEGATIVE mg/dL
Ketones, ur: NEGATIVE mg/dL
Nitrite: NEGATIVE
PH: 5.5 (ref 5.0–8.0)
Protein, ur: NEGATIVE mg/dL
Specific Gravity, Urine: 1.015 (ref 1.005–1.030)
Urobilinogen, UA: 0.2 mg/dL (ref 0.0–1.0)

## 2013-07-24 MED ORDER — ACETAMINOPHEN 500 MG PO TABS: 1000.0000 mg | ORAL_TABLET | Freq: Once | ORAL | Status: DC

## 2013-07-24 NOTE — Discharge Instructions (Signed)

## 2013-07-24 NOTE — MAU Provider Note (Signed)
History     CSN: [id number]  Arrival date and time: 07/24/13 1911   First Provider Initiated Contact with Patient 07/24/13 2012      No chief complaint on file.  HPI  Ms. Nancy Rivera is a 28 y.o. female [id number] who presents with concerns of high blood pressure and abdominal cramping April 18th; vaginal delivery; induced at 38 weeks for preeclampsia. Dr. Corinna Capra delivered her baby without any complications.  She was in the office on Tuesday and her pressure was elevated; since she has been home this week her BP's have been low. She complains of HA everyday since Tuesday. She has not taken anything for HA today; prior to today she had been taking ibuprofen without relief of her HA; ibuprofen has helped her abdominal cramping.  She currently rates her HA pain 5/10 Her bleeding is minimal; the color alternates from brown to red.   OB History   Grav Para Term Preterm Abortions TAB SAB Ect Mult Living   1 1 1       1       Past Medical History  Diagnosis Date  . Obesity   . Oligomenorrhea   . Anxiety     Past Surgical History  Procedure Laterality Date  . Removal of cyst  1985    FROM UNDER LEFT EYEBROW    Family History  Problem Relation Age of Onset  . Cancer Maternal Grandfather     LUNG  . Cancer Paternal Grandfather     LYMPHOMA    History  Substance Use Topics  . Smoking status: Former Smoker -- 0.50 packs/day  . Smokeless tobacco: Not on file  . Alcohol Use: No    Allergies: No Known Allergies  Prescriptions prior to admission  Medication Sig Dispense Refill  . acetaminophen (TYLENOL) 500 MG tablet Take 1,000 mg by mouth every 6 (six) hours as needed for mild pain or headache.      . docusate sodium (COLACE) 100 MG capsule Take 100 mg by mouth at bedtime.       Marland Kitchen FENUGREEK PO Take 1 capsule by mouth 3 (three) times daily.      Marland Kitchen ibuprofen (ADVIL,MOTRIN) 600 MG tablet Take 1 tablet (600 mg total) by mouth every 6 (six) hours.  30 tablet  1  .  oxyCODONE-acetaminophen (PERCOCET/ROXICET) 5-325 MG per tablet Take 1-2 tablets by mouth every 4 (four) hours as needed for severe pain (moderate - severe pain).  30 tablet  0  . Prenatal Vit-Fe Fumarate-FA (PRENATAL MULTIVITAMIN) TABS tablet Take 1 tablet by mouth at bedtime.       . ranitidine (ZANTAC) 150 MG tablet Take 150 mg by mouth daily as needed for heartburn.       Results for orders placed during the hospital encounter of 07/24/13 (from the past 48 hour(s))  URINALYSIS, ROUTINE W REFLEX MICROSCOPIC     Status: Abnormal   Collection Time    07/24/13  7:40 PM      Result Value Ref Range   Color, Urine YELLOW  YELLOW   APPearance CLEAR  CLEAR   Specific Gravity, Urine 1.015  1.005 - 1.030   pH 5.5  5.0 - 8.0   Glucose, UA NEGATIVE  NEGATIVE mg/dL   Hgb urine dipstick LARGE (*) NEGATIVE   Bilirubin Urine NEGATIVE  NEGATIVE   Ketones, ur NEGATIVE  NEGATIVE mg/dL   Protein, ur NEGATIVE  NEGATIVE mg/dL   Urobilinogen, UA 0.2  0.0 - 1.0 mg/dL   Nitrite NEGATIVE  NEGATIVE   Leukocytes, UA SMALL (*) NEGATIVE  URINE MICROSCOPIC-ADD ON     Status: Abnormal   Collection Time    07/24/13  7:40 PM      Result Value Ref Range   Squamous Epithelial / LPF RARE  RARE   WBC, UA 3-6  <3 WBC/hpf   RBC / HPF 21-50  <3 RBC/hpf   Bacteria, UA FEW (*) RARE     Review of Systems  Constitutional: Positive for chills. Negative for fever.  Eyes: Negative for blurred vision.  Respiratory: Negative for shortness of breath.   Cardiovascular: Negative for chest pain.  Gastrointestinal: Positive for abdominal pain (Bilateral lower abdominal cramping ).  Neurological: Positive for headaches. Negative for dizziness.   Physical Exam   Blood pressure 110/79, pulse 94, temperature 98.5 F (36.9 C), temperature source Oral, resp. rate 20, height 6\' 8"  (2.032 m), weight 124.002 kg (273 lb 6 oz), unknown if currently breastfeeding.  Physical Exam  Constitutional: She is oriented to person, place, and  time. She appears well-developed and well-nourished. No distress.  HENT:  Head: Normocephalic.  Eyes: Pupils are equal, round, and reactive to light.  Neck: Neck supple.  Respiratory: Effort normal.  Musculoskeletal: Normal range of motion.       Right ankle: She exhibits swelling.       Left ankle: She exhibits swelling.  Swelling in bilateral lower extremities; non pitting.   Neurological: She is alert and oriented to person, place, and time. She has normal reflexes. GCS eye subscore is 4. GCS verbal subscore is 5. GCS motor subscore is 6.  Skin: Skin is warm. She is not diaphoretic.  Psychiatric: Her behavior is normal.    MAU Course  Procedures None  MDM Negative fever  UA Spoke with Dr. Radene Knee at 2040; BP readings discussed.   Assessment and Plan   A:  HA; postpartum   P:  Discharge home in stable condition Return to MAU if symptoms worsen Continue ibuprofen and alternate with percocet Call the office tomorrow if symptoms have not improved  Darrelyn Hillock Rasch, NP  07/24/2013, 8:12 PM

## 2013-07-24 NOTE — MAU Note (Addendum)
PT SAYS SHE DEL VAG ON 4-18-  VAG-  DR LOWE-   BREASTFEEDING.     SAYS SHE HAS BAD CRAMPS IN AFTERNOON- UNRELATED TO BREASTFEEDING.  YESTERDAY SHE SAT IN SITZ BATH - HELPED - BUT TODAY IT DID NOT.    NO MED FOR PAIN.    STARTED HAVING H/A ON Tuesday-    DENIES BLURRED VISION / CHEST PAIN.   HX OF MIGRAINES- LAST TIME WAS 3-4 MTHS AGO .   WAS INDUCED FOR HYPERTENSION-   148/98.-  NO H/A WHILE IN LABOR.     CALLED OFFICE TOLD TO COME HERE.  BP AT HOME YESTERDAY- 132/82.  ON Tuesday BP AT OFFICE -  132/90.     WHILE IN LABOR-  SAID LABS WERE SOME HIGH-    SENT HOME-  NO MEDS.

## 2014-01-09 ENCOUNTER — Other Ambulatory Visit: Payer: Self-pay

## 2014-01-26 ENCOUNTER — Encounter (HOSPITAL_COMMUNITY): Payer: Self-pay | Admitting: *Deleted

## 2014-06-24 ENCOUNTER — Ambulatory Visit
Admission: RE | Admit: 2014-06-24 | Discharge: 2014-06-24 | Disposition: A | Discharge status: Home / Self Care | Payer: 59 | Source: Ambulatory Visit | Attending: Family Medicine | Admitting: Family Medicine

## 2014-06-24 ENCOUNTER — Other Ambulatory Visit: Payer: Self-pay | Admitting: Family Medicine

## 2014-06-24 DIAGNOSIS — M549 Dorsalgia, unspecified: Secondary | ICD-10-CM

## 2014-07-02 ENCOUNTER — Ambulatory Visit: Payer: 59 | Attending: Family Medicine

## 2014-07-02 DIAGNOSIS — M545 Low back pain, unspecified: Secondary | ICD-10-CM

## 2014-07-02 DIAGNOSIS — R6889 Other general symptoms and signs: Secondary | ICD-10-CM | POA: Diagnosis not present
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
## 2014-07-02 NOTE — Patient Instructions (Signed)
Perform all exercises below:  Hold _20___ seconds. Repeat _3___ times.  Do __3__ sessions per day. CAUTION: Movement should be gentle, steady and slow.  Knee to Chest  Lying supine, bend involved knee to chest. Perform with each leg.  Lumbar Rotation: Caudal - Bilateral (Supine)  Feet and knees together, arms outstretched, rotate knees left, turning head in opposite direction, until stretch is felt.    Marland KitchenHIP: Hamstrings - Short Sitting   Rest leg on raised surface. Keep knee straight. Lift chest. Hold _20__ seconds. _3__ reps per set, __2-3_ sets per day, ___ days per week  Copyright  VHI. All rights reserved.  Cervico-Thoracic: Extension / Rotation (Sitting)   Reach across body with left arm and grasp back of chair. Gently look over right side shoulder. Hold 20____ seconds. Relax. Repeat _3___ times per set. Do __1__ sets per session. Do _2-3___ sessions per day.  http://orth.exer.us/981   Copyright  VHI. All rights reserved.

## 2014-07-09 ENCOUNTER — Encounter: Payer: Self-pay | Admitting: Physical Therapy

## 2014-07-09 ENCOUNTER — Ambulatory Visit: Payer: 59 | Admitting: Physical Therapy

## 2014-07-09 ENCOUNTER — Ambulatory Visit: Payer: 59

## 2014-07-09 DIAGNOSIS — M545 Low back pain, unspecified: Secondary | ICD-10-CM

## 2014-07-09 DIAGNOSIS — R6889 Other general symptoms and signs: Secondary | ICD-10-CM

## 2014-07-09 NOTE — Therapy (Signed)
[facility] [address] [place], [place], [address] Phone: [phone]   Fax:  [phone]  Physical Therapy Treatment  Patient Details  Name: Nancy Rivera MRN: [id number] Date of Birth: [birth year] Referring Provider:  London Pepper, MD  Encounter Date: 07/09/2014      PT End of Session - 07/09/14 1246    Visit Number 2   Date for PT Re-Evaluation 08/27/14   PT Start Time 1229   PT Stop Time 1315   PT Time Calculation (min) 46 min   Activity Tolerance Patient tolerated treatment well   Behavior During Therapy [facility] for tasks assessed/performed      Past Medical History  Diagnosis Date  . Obesity   . Oligomenorrhea   . Anxiety     Past Surgical History  Procedure Laterality Date  . Removal of cyst  1985    FROM UNDER LEFT EYEBROW    There were no vitals filed for this visit.  Visit Diagnosis:  Bilateral low back pain without sciatica  Activity intolerance      Subjective Assessment - 07/09/14 1240    How long can you stand comfortably? 30-40 minutes- need to sit                       [facility] Adult PT Treatment/Exercise - 07/09/14 0001    Lumbar Exercises: Stretches   Active Hamstring Stretch 3 reps;20 seconds   Single Knee to Chest Stretch 3 reps;20 seconds   Lower Trunk Rotation 3 reps;20 seconds   Lumbar Exercises: Aerobic   Stationary Bike L1 x 6 min   Lumbar Exercises: Seated   Other Seated Lumbar Exercises thoracolumbar rotation 3x20 seconds   Lumbar Exercises: Supine   Other Supine Lumbar Exercises TA activation finding pelvic floor x 10 with 5 sec hold, ER unilateral, marching, single leg slide each x 5                PT Education - 07/09/14 1245    Education provided Yes   Education Details lifting principles and TA activation   Methods Explanation;Handout;Demonstration   Comprehension Verbalized understanding;Returned demonstration          PT Short  Term Goals - 07/02/14 1248    PT SHORT TERM GOAL #1   Title be independent with initial HEP   Time 4   Period Weeks   Status New   PT SHORT TERM GOAL #2   Title report < or = to 4/10 LBP after walking for exercise   Time 4   Period Weeks   Status New   PT SHORT TERM GOAL #3   Title report a 30% reduction in LBP with standing to cook a meal   Time 4   Period Weeks   Status New           PT Long Term Goals - 07/02/14 1212    PT LONG TERM GOAL #1   Title be independent in advanced HEP   Time 8   Period Weeks   Status New   PT LONG TERM GOAL #2   Title reduce FOTO to < or = to 18% limitation   Time 8   Period Weeks   Status New   PT LONG TERM GOAL #3   Title report < or = to 2/10 LBP after walking for exercise   Time 8   Period Weeks   Status New   PT LONG TERM GOAL #4  Title report a 75% reduction in LBP with standing to cook a meal   Time 8   Period Weeks   Status New   PT LONG TERM GOAL #5   Title verbalize and demonstrate correct body mechanics for care of her child and household tasks to reduce lumbar strain   Time 8   Period Weeks   Status New               Plan - 07/09/14 1246    PT Next Visit Plan Deep abdominal strength   PT Home Exercise Plan review bodymechanics   Consulted and Agree with Plan of Care Patient        Problem List Patient Active Problem List   Diagnosis Date Noted  . Preeclampsia 07/11/2013    NAUMANN-HOUEGNIFIO,Danyel Tobey PTA 07/09/2014, 1:08 PM  [facility] [address], [place], [place], [address] Phone: [phone]   Fax:  [phone]

## 2014-07-09 NOTE — Patient Instructions (Addendum)
 Lifting Principles  .Maintain proper posture and head alignment. .Slide object as close as possible before lifting. .Move obstacles out of the way. .Test before lifting; ask for help if too heavy. .Tighten stomach muscles without holding breath. .Use smooth movements; do not jerk. .Use legs to do the work, and pivot with feet. .Distribute the work load symmetrically and close to the center of trunk. .Push instead of pull whenever possible.   Squat down and hold basket close to stand. Use leg muscles to do the work.    Avoid twisting or bending back. Pivot around using foot movements, and bend at knees if needed when reaching for articles.        Getting Into / Out of Bed   Lower self to lie down on one side by raising legs and lowering head at the same time. Use arms to assist moving without twisting. Bend both knees to roll onto back if desired. To sit up, start from lying on side, and use same move-ments in reverse. Keep trunk aligned with legs.    Shift weight from front foot to back foot as item is lifted off shelf.    When leaning forward to pick object up from floor, extend one leg out behind. Keep back straight. Hold onto a sturdy support with other hand.      Sit upright, head facing forward. Try using a roll to support lower back. Keep shoulders relaxed, and avoid rounded back. Keep hips level with knees. Avoid crossing legs for long periods.    Lower abdominal/core stability exercises  1. Practice your breathing technique: Inhale through your nose expanding your belly and rib cage. Try not to breathe into your chest. Exhale slowly and gradually out your mouth feeling a sense of softness to your body. Practice multiple times. This can be performed unlimited.  2. Finding the lower abdominals. Laying on your back with the knees bent, place your fingers just below your belly button. Using your breathing technique from above, on your exhale gently pull the  belly button away from your fingertips without tensing any other muscles. Practice this 5x. Next, as you exhale, draw belly button inwards and hold onto it...then feel as if you are pulling that muscle across your pelvis like you are tightening a belt. This can be hard to do at first so be patient and practice. Do 5-10 reps 1-3 x day. Always recognize quality over quantity; if your abdominal muscles become tired you will notice you may tighten/contract other muscles. This is the time to take a break.   Practice this first laying on your back, then in sitting, progressing to standing and finally adding it to all your daily movements.   3. Finding your pelvic floor. Using the breathing technique above, when your exhale, this time draw your pelvic floor muscles up as if you were attempting to stop the flow of urination. Be careful NOT to tense any other muscles. This can be hard, BE PATIENT. Try to hold up to 10 seconds repeating 10x. Try 2x a day. Once you feel you are doing this well, add this contraction to exercise #2. First contracting your pelvic floor followed by lower abdominals.  4. Adding leg movements. Add the following leg movements to challenge your ability to keep your core stable:  1. Single leg drop outs: Laying on your back with knees bent feet flat. Inhale,  dropping one knee outward KEEPING YOUR PELVIS STILL. Exhale as you bring the leg back, simultaneously performing   your lower abdominal contraction. Do 5-10 on each leg.  2. Marching: While keeping your pelvis still, lift the right foot a few inches, put it down then lift left foot. This will mimic a march. Start slow to establish control. Once you have control you may speed it up. Do 10-20x. You MUST keep your lower abdominlas contracted while you march. Breathe naturally   3. Single leg slides: Inhale while you slowly slide one leg out keeping your pelvis still. Only slide your leg as far as you can keep your pelvis still. Exhale as you  bring the leg back to the start, contracting the lower abdominals as you do that. Keep your upper body relaxed. Do 5-10 on each side. 

## 2014-07-13 ENCOUNTER — Encounter: Payer: Self-pay | Admitting: Physical Therapy

## 2014-07-13 ENCOUNTER — Ambulatory Visit: Payer: 59 | Admitting: Physical Therapy

## 2014-07-13 DIAGNOSIS — M545 Low back pain, unspecified: Secondary | ICD-10-CM

## 2014-07-13 DIAGNOSIS — R6889 Other general symptoms and signs: Secondary | ICD-10-CM

## 2014-07-13 NOTE — Therapy (Signed)
[facility] [address], [place], [place], [address] Phone: [phone]   Fax:  [phone]  Physical Therapy Treatment  Patient Details  Name: Nancy Rivera MRN: [id number] Date of Birth: 01/25/1986 Referring Provider:  London Pepper, MD  Encounter Date: 07/13/2014      PT End of Session - 07/13/14 1607    Visit Number 3   Date for PT Re-Evaluation 08/27/14   PT Start Time 3329   PT Stop Time 5188   PT Time Calculation (min) 45 min   Activity Tolerance Patient tolerated treatment well   Behavior During Therapy [facility] for tasks assessed/performed      Past Medical History  Diagnosis Date  . Obesity   . Oligomenorrhea   . Anxiety     Past Surgical History  Procedure Laterality Date  . Removal of cyst  1985    FROM UNDER LEFT EYEBROW    There were no vitals filed for this visit.  Visit Diagnosis:  Bilateral low back pain without sciatica  Activity intolerance      Subjective Assessment - 07/13/14 1535    Limitations Walking;Standing  pt was able to walk for an hour pushing the stroller, but  needed advil after to help with the pain   How long can you stand comfortably? standing tolerance improved to 45 min    How long can you walk comfortably? 40-45 minutes for exercise.  Pain after this activity   Currently in Pain? No/denies   Multiple Pain Sites No                         [facility] Adult PT Treatment/Exercise - 07/13/14 0001    Lumbar Exercises: Stretches   Active Hamstring Stretch 3 reps;20 seconds  in supine with 20 sec hold   Single Knee to Chest Stretch 3 reps;20 seconds   Double Knee to Chest Stretch 3 reps;20 seconds   Lower Trunk Rotation 3 reps;20 seconds   Lumbar Exercises: Aerobic   Stationary Bike L1 x 6 min   UBE (Upper Arm Bike) L3 x 34min   Lumbar Exercises: Seated   Other Seated Lumbar Exercises thoracolumbar rotation 3x20 seconds   Lumbar Exercises: Supine   Bridge 20 reps;5 seconds   Other Supine Lumbar Exercises TA activation finding pelvic floor x 10 with 5 sec hold, ER unilateral, marching, single leg slide each x 5                  PT Short Term Goals - 07/13/14 1553    PT SHORT TERM GOAL #1   Title be independent with initial HEP   Time 4   Period Weeks   Status On-going   PT SHORT TERM GOAL #2   Title report < or = to 4/10 LBP after walking for exercise   Time 4   Period Weeks   Status On-going   PT SHORT TERM GOAL #3   Title report a 30% reduction in LBP with standing to cook a meal   Time 4   Period Weeks   Status Achieved           PT Long Term Goals - 07/13/14 1554    PT LONG TERM GOAL #1   Title be independent in advanced HEP   Time 8   Period Weeks   Status On-going   PT LONG TERM GOAL #2   Title reduce FOTO to < or = to 18% limitation  Time 8   Period Weeks   Status On-going   PT LONG TERM GOAL #3   Title report < or = to 2/10 LBP after walking for exercise   Time 8   Period Weeks   Status On-going   PT LONG TERM GOAL #4   Title report a 75% reduction in LBP with standing to cook a meal   Time 8   Period Weeks   Status Achieved   PT LONG TERM GOAL #5   Title verbalize and demonstrate correct body mechanics for care of her child and household tasks to reduce lumbar strain   Time 8   Period Weeks   Status Achieved               Plan - 07/13/14 1607    Clinical Impression Statement Pt reports she was able to walk with the stroller for an hour, but needed Advil after to help with the discomfort. Now able to cook a meal without pain   Pt will benefit from skilled therapeutic intervention in order to improve on the following deficits Decreased activity tolerance   Rehab Potential Good   PT Frequency 2x / week   PT Duration 8 weeks   PT Treatment/Interventions Moist Heat;ADLs/Self Care Home Management;Therapeutic activities;Patient/family education;Therapeutic  exercise;Ultrasound;Manual techniques;Cryotherapy;Electrical Stimulation   PT Next Visit Plan may add prayer stretch, review abdominal strength   Consulted and Agree with Plan of Care Patient        Problem List Patient Active Problem List   Diagnosis Date Noted  . Preeclampsia 07/11/2013    NAUMANN-HOUEGNIFIO,Keigan Girten PTA 07/13/2014, 4:18 PM  [facility] [address] [place], [place], [address] Phone: [phone]   Fax:  [phone]

## 2014-07-16 ENCOUNTER — Encounter: Payer: Self-pay | Admitting: Physical Therapy

## 2014-07-16 ENCOUNTER — Ambulatory Visit: Payer: 59 | Admitting: Physical Therapy

## 2014-07-16 DIAGNOSIS — R6889 Other general symptoms and signs: Secondary | ICD-10-CM

## 2014-07-16 DIAGNOSIS — M545 Low back pain, unspecified: Secondary | ICD-10-CM
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
## 2014-07-16 NOTE — Patient Instructions (Signed)
Baby Bridges   Exhaling, lift hips. Hold for _1__ breaths. Exhaling, relax hips. Exhaling, lift hips and waist. Hold for _1__ breaths. Exhaling, relax waist and hips. Exhaling, lift hips, waist, and rib cage. Hold for ___ breaths. Exhaling, relax rib cage, waist, and hips. Repeat sequence _20__ times. Do _1__ times per day. Child can roll ball under hips Copyright  VHI. All rights reserved.  Upper Body Extension (All-Fours)   Raise right arm in front. Do not arch neck. Be sure to keep back flat. Repeat _10___ times per set. Do 1____ sets per session. Do __1__ sessions per day.  http://orth.exer.us/108   Copyright  VHI. All rights reserved.  Hip Extension (All-Fours)  Slide heel back.  Do not lift foot Lift right leg back with knee slightly flexed. Do not arch neck or back. Repeat _10___ times per set. Do __1__ sets per session. Do __1__ sessions per day.  http://orth.exer.us/106   Copyright  VHI. All rights reserved.  Patient able to return demonstration correctly with above exercises.

## 2014-07-21 ENCOUNTER — Ambulatory Visit: Payer: 59 | Admitting: Physical Therapy

## 2014-07-21 ENCOUNTER — Encounter: Payer: Self-pay | Admitting: Physical Therapy

## 2014-07-21 DIAGNOSIS — M545 Low back pain, unspecified: Secondary | ICD-10-CM

## 2014-07-21 DIAGNOSIS — R6889 Other general symptoms and signs: Secondary | ICD-10-CM

## 2014-07-21 NOTE — Therapy (Signed)
[facility] [address], [place], [place], [address] Phone: [phone]   Fax:  [phone]  Physical Therapy Treatment  Patient Details  Name: Nancy Rivera MRN: [id number] Date of Birth: 1985-11-17 Referring Provider:  London Pepper, MD  Encounter Date: 07/21/2014      PT End of Session - 07/21/14 1447    Visit Number 5   Date for PT Re-Evaluation 08/27/14   PT Start Time 1400   PT Stop Time 1440   PT Time Calculation (min) 40 min   Activity Tolerance Patient tolerated treatment well   Behavior During Therapy [facility] for tasks assessed/performed      Past Medical History  Diagnosis Date  . Obesity   . Oligomenorrhea   . Anxiety     Past Surgical History  Procedure Laterality Date  . Removal of cyst  1985    FROM UNDER LEFT EYEBROW    There were no vitals filed for this visit.  Visit Diagnosis:  Bilateral low back pain without sciatica  Activity intolerance      Subjective Assessment - 07/21/14 1408    Subjective Felt good after last visit. Still hard to lift extremity in quadruped.    How long can you stand comfortably? standing tolerance improved to 60 min    How long can you walk comfortably? 1 hour   Diagnostic tests x-ray: normal   Patient Stated Goals reduce back pain with standing and walking long periods   Currently in Pain? No/denies                         [facility] Adult PT Treatment/Exercise - 07/21/14 0001    Posture/Postural Control   Posture Comments Instruction on correct posture on the plane, sit with lumbar roll, sit on aisle seat to get up during the flight   Lumbar Exercises: Aerobic   UBE (Upper Arm Bike) L4x4 min  sit on physioball   Lumbar Exercises: Standing   Functional Squats Limitations 10x with core contraction   Shoulder Extension Strengthening;Both;20 reps;Theraband   Theraband Level (Shoulder Extension) Level 3 (Green)   Other Standing Lumbar  Exercises go up and down steps forward and laterally 5 times with verbal cues on keeping pelvic leveled   Other Standing Lumbar Exercises pushup on counter 10x with abdominal bracing   Lumbar Exercises: Seated   Other Seated Lumbar Exercises horizontal abd with red band 20 x , diagonals green band 20x    Lumbar Exercises: Prone   Other Prone Lumbar Exercises modified PLANK hold 10 sed 1x                PT Education - 07/21/14 1430    Education provided Yes   Education Details back strengthening with green theraband and squats, modified PLANK, Stairs with keeping pelvic leveld, countertop push-ups, correct sitting posture on plane   Person(s) Educated Patient   Methods Explanation;Demonstration;Tactile cues;Verbal cues;Handout   Comprehension Returned demonstration;Verbalized understanding          PT Short Term Goals - 07/21/14 1409    PT SHORT TERM GOAL #1   Title be independent with initial HEP   Time 4   Period Weeks   Status Achieved   PT SHORT TERM GOAL #2   Title report < or = to 4/10 LBP after walking for exercise   Time 4   Period Weeks   Status Achieved  1/10 pain level   PT SHORT TERM GOAL #  3   Title report a 30% reduction in LBP with standing to cook a meal   Period Weeks   Status Achieved  80%           PT Long Term Goals - 07/21/14 1411    PT LONG TERM GOAL #1   Title be independent in advanced HEP   Time 8   Period Weeks   Status On-going   PT LONG TERM GOAL #3   Title report < or = to 2/10 LBP after walking for exercise   Time 8   Period Weeks   Status Achieved  1/10   PT LONG TERM GOAL #4   Title report a 75% reduction in LBP with standing to cook a meal   Time 8   Period Weeks   Status Achieved  80% better   PT LONG TERM GOAL #5   Title verbalize and demonstrate correct body mechanics for care of her child and household tasks to reduce lumbar strain   Time 8   Period Weeks   Status Achieved               Plan -  07/21/14 1447    Pt will benefit from skilled therapeutic intervention in order to improve on the following deficits Decreased activity tolerance   Rehab Potential Good   PT Frequency 2x / week   PT Duration 8 weeks   PT Treatment/Interventions Moist Heat;ADLs/Self Care Home Management;Therapeutic activities;Patient/family education;Therapeutic exercise;Ultrasound;Manual techniques;Cryotherapy;Electrical Stimulation   PT Home Exercise Plan current HEP   Recommended Other Services None   Consulted and Agree with Plan of Care Patient        Problem List Patient Active Problem List   Diagnosis Date Noted  . Preeclampsia 07/11/2013    Christen Bedoya,PT 07/21/2014, 2:49 PM  [facility] [address] [place], [place], [address] Phone: [phone]   Fax:  [phone]

## 2014-07-21 NOTE — Patient Instructions (Signed)
  PNF Strengthening: Resisted   Standing with resistive band around each hand, bring right arm up and away, thumb back. Repeat _10___ times per set. Do _2___ sets per session. Do _1-2___ sessions per day.      Resisted Horizontal Abduction: Bilateral   Sit or stand, tubing in both hands, arms out in front. Keeping arms straight, pinch shoulder blades together and stretch arms out. Repeat _10___ times per set. Do 2____ sets per session. Do _1-2___ sessions per day.                  Scapular Retraction: Elbow Flexion (Standing)   With elbows bent to 90, pinch shoulder blades together and rotate arms out, keeping elbows bent.Hold 5 sec.  Repeat _10___ times per set. Do _1___ sets per session. Do many____ sessions per day.    Strengthening: Resisted Extension   Hold tubing in right hand, arm forward. Pull arm back, elbow straight. Repeat _10___ times per set. Do _2___ sets per session. Do _1-2___ sessions per day.  Can place band around the front of your body and perform with both arms at the same time.    Squat: Pelvic and Feet Neutral   Stand with feet shoulder width apart. Squat, bending knees and hips. Keep pelvis neutral. Do _10__ times, _1__ times per day.  http://ss.exer.us/138   Copyright  VHI. All rights reserved.  Patient able to return demonstration with above exercises.

## 2014-07-24 ENCOUNTER — Encounter: Payer: 59 | Admitting: Physical Therapy

## 2014-08-03 ENCOUNTER — Encounter: Payer: Self-pay | Admitting: Physical Therapy

## 2014-08-03 ENCOUNTER — Ambulatory Visit: Payer: 59 | Attending: Family Medicine | Admitting: Physical Therapy

## 2014-08-03 DIAGNOSIS — M545 Low back pain, unspecified: Secondary | ICD-10-CM

## 2014-08-03 DIAGNOSIS — R6889 Other general symptoms and signs: Secondary | ICD-10-CM | POA: Insufficient documentation

## 2014-08-03 NOTE — Therapy (Signed)
[facility] [address], [place], [place], [address] Phone: [phone]   Fax:  [phone]  Physical Therapy Treatment  Patient Details  Name: Nancy Rivera MRN: [id number] Date of Birth: [birth year] Referring Provider:  London Pepper, MD  Encounter Date: 08/03/2014      PT End of Session - 08/03/14 1557    Visit Number 6   Date for PT Re-Evaluation 08/27/14   PT Start Time 6168   PT Stop Time 1600   PT Time Calculation (min) 29 min   Activity Tolerance Patient tolerated treatment well   Behavior During Therapy [facility] for tasks assessed/performed      Past Medical History  Diagnosis Date  . Obesity   . Oligomenorrhea   . Anxiety     Past Surgical History  Procedure Laterality Date  . Removal of cyst  1985    FROM UNDER LEFT EYEBROW    There were no vitals filed for this visit.  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   Currently in Pain? No/denies            [facility] PT Assessment - 08/03/14 0001    Observation/Other Assessments   Focus on Therapeutic Outcomes (FOTO)  2% limited                     [facility] Adult PT Treatment/Exercise - 08/03/14 0001    Lumbar Exercises: Aerobic   UBE (Upper Arm Bike) Sitting on green ball L2 3x3    Lumbar Exercises: Standing   Other Standing Lumbar Exercises  Green band scapular unattached  ex 10x ea   Other Standing Lumbar Exercises pushup on counter 10x with abdominal bracing   Lumbar Exercises: Sidelying   Other Sidelying Lumbar Exercises modified plank Bil  x10 sec   Lumbar Exercises: Quadruped   Plank Modified 2 x15 sec                   PT Short Term Goals - 07/21/14 1409    PT SHORT TERM GOAL #1   Title be independent with initial HEP   Time 4   Period Weeks   Status  Achieved   PT SHORT TERM GOAL #2   Title report < or = to 4/10 LBP after walking for exercise   Time 4   Period Weeks   Status Achieved  1/10 pain level   PT SHORT TERM GOAL #3   Title report a 30% reduction in LBP with standing to cook a meal   Period Weeks   Status Achieved  80%           PT Long Term Goals - 08/03/14 1541    PT LONG TERM GOAL #1   Title be independent in advanced HEP   Time 8   Period Weeks   Status Achieved   PT LONG TERM GOAL #2   Title reduce FOTO to < or = to 18% limitation   Time 8   Period Weeks   Status Achieved  2%   PT LONG TERM GOAL #3   Title report < or = to 2/10 LBP after walking for exercise   Period Weeks   Status Achieved   PT LONG TERM GOAL #4   Title report a 75% reduction in LBP  with standing to cook a meal   Time 8   Period Weeks   Status Achieved               Plan - 08/03/14 1557    Clinical Impression Statement Patient satisfied with her current level of function. Pt reports no pain with anything. All goals met.   Pt will benefit from skilled therapeutic intervention in order to improve on the following deficits Decreased activity tolerance   Rehab Potential Good   PT Frequency 2x / week   PT Duration 8 weeks   PT Treatment/Interventions Moist Heat;ADLs/Self Care Home Management;Therapeutic activities;Patient/family education;Therapeutic exercise;Ultrasound;Manual techniques;Cryotherapy;Electrical Stimulation   PT Next Visit Plan DC patient   Consulted and Agree with Plan of Care Patient        Problem List Patient Active Problem List   Diagnosis Date Noted  . Preeclampsia 07/11/2013    Hayleigh Bawa, PT 08/03/2014, 5:17 PM  [facility] [address], [place], [place], [address] Phone: [phone]   Fax:  [phone]  PHYSICAL THERAPY DISCHARGE SUMMARY  Visits from Start of Care: 6 Current functional level related to goals / functional  outcomes: See above note for goal update. Patient has met all of her goals.    Remaining deficits: None   Education / Equipment: HEP Plan: Patient agrees to discharge.  Patient goals were met. Patient is being discharged due to meeting the stated rehab goals.  Thank you for the referral. Earlie Counts, PT 08/03/2014 5:17 PM ?????

## 2014-08-03 NOTE — Therapy (Signed)
[facility] [address] [place], [place], [address] Phone: [phone]   Fax:  [phone]  Physical Therapy Treatment  Patient Details  Name: Nancy Rivera MRN: [id number] Date of Birth: [birth year] Referring Provider:  London Pepper, MD  Encounter Date: 08/03/2014      PT End of Session - 08/03/14 1557    Visit Number 6   Date for PT Re-Evaluation 08/27/14   PT Start Time 3536   PT Stop Time 1600   PT Time Calculation (min) 29 min   Activity Tolerance Patient tolerated treatment well   Behavior During Therapy [facility] for tasks assessed/performed      Past Medical History  Diagnosis Date  . Obesity   . Oligomenorrhea   . Anxiety     Past Surgical History  Procedure Laterality Date  . Removal of cyst  1985    FROM UNDER LEFT EYEBROW    There were no vitals filed for this visit.  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   Currently in Pain? No/denies            [facility] PT Assessment - 08/03/14 0001    Observation/Other Assessments   Focus on Therapeutic Outcomes (FOTO)  2% limited                     [facility] Adult PT Treatment/Exercise - 08/03/14 0001    Lumbar Exercises: Aerobic   UBE (Upper Arm Bike) Sitting on green ball L2 3x3    Lumbar Exercises: Standing   Other Standing Lumbar Exercises  Green band scapular unattached  ex 10x ea   Other Standing Lumbar Exercises pushup on counter 10x with abdominal bracing   Lumbar Exercises: Sidelying   Other Sidelying Lumbar Exercises modified plank Bil  x10 sec   Lumbar Exercises: Quadruped   Plank Modified 2 x15 sec                   PT Short Term Goals - 07/21/14 1409    PT SHORT TERM GOAL #1   Title be independent with initial HEP   Time 4   Period Weeks   Status  Achieved   PT SHORT TERM GOAL #2   Title report < or = to 4/10 LBP after walking for exercise   Time 4   Period Weeks   Status Achieved  1/10 pain level   PT SHORT TERM GOAL #3   Title report a 30% reduction in LBP with standing to cook a meal   Period Weeks   Status Achieved  80%           PT Long Term Goals - 08/03/14 1541    PT LONG TERM GOAL #1   Title be independent in advanced HEP   Time 8   Period Weeks   Status Achieved   PT LONG TERM GOAL #2   Title reduce FOTO to < or = to 18% limitation   Time 8   Period Weeks   Status Achieved  2%   PT LONG TERM GOAL #3   Title report < or = to 2/10 LBP after walking for exercise   Period Weeks   Status Achieved   PT LONG TERM GOAL #4   Title report a 75% reduction in LBP  with standing to cook a meal   Time 8   Period Weeks   Status Achieved               Plan - 08/03/14 1557    Clinical Impression Statement Patient satisfied with her current level of function. Pt reports no pain with anything. All goals met.   Pt will benefit from skilled therapeutic intervention in order to improve on the following deficits Decreased activity tolerance   Rehab Potential Good   PT Frequency 2x / week   PT Duration 8 weeks   PT Treatment/Interventions Moist Heat;ADLs/Self Care Home Management;Therapeutic activities;Patient/family education;Therapeutic exercise;Ultrasound;Manual techniques;Cryotherapy;Electrical Stimulation   PT Next Visit Plan DC patient   Consulted and Agree with Plan of Care Patient        Problem List Patient Active Problem List   Diagnosis Date Noted  . Preeclampsia 07/11/2013    Camille Thau, PTA 08/03/2014, 4:00 PM  [facility] [address] [place], [place], [address] Phone: [phone]   Fax:  [phone]

## 2014-08-05 ENCOUNTER — Encounter: Payer: 59 | Admitting: Physical Therapy

## 2014-09-01 ENCOUNTER — Other Ambulatory Visit: Payer: Self-pay | Admitting: Obstetrics & Gynecology

## 2014-09-02 LAB — CYTOLOGY - PAP

## 2016-06-01 DIAGNOSIS — G478 Other sleep disorders: Secondary | ICD-10-CM | POA: Diagnosis not present

## 2016-06-01 DIAGNOSIS — R0681 Apnea, not elsewhere classified: Secondary | ICD-10-CM | POA: Diagnosis not present

## 2016-06-01 DIAGNOSIS — G4719 Other hypersomnia: Secondary | ICD-10-CM | POA: Diagnosis not present

## 2016-06-01 DIAGNOSIS — N915 Oligomenorrhea, unspecified: Secondary | ICD-10-CM | POA: Diagnosis not present

## 2016-06-01 DIAGNOSIS — I10 Essential (primary) hypertension: Secondary | ICD-10-CM | POA: Diagnosis not present

## 2016-06-01 DIAGNOSIS — R0683 Snoring: Secondary | ICD-10-CM | POA: Diagnosis not present

## 2016-06-23 DIAGNOSIS — G4733 Obstructive sleep apnea (adult) (pediatric): Secondary | ICD-10-CM | POA: Diagnosis not present

## 2016-06-26 DIAGNOSIS — G4733 Obstructive sleep apnea (adult) (pediatric): Secondary | ICD-10-CM | POA: Diagnosis not present

## 2016-07-05 DIAGNOSIS — G4733 Obstructive sleep apnea (adult) (pediatric): Secondary | ICD-10-CM | POA: Diagnosis not present

## 2016-08-04 DIAGNOSIS — G4733 Obstructive sleep apnea (adult) (pediatric): Secondary | ICD-10-CM | POA: Diagnosis not present

## 2016-09-04 DIAGNOSIS — G4733 Obstructive sleep apnea (adult) (pediatric): Secondary | ICD-10-CM | POA: Diagnosis not present

## 2016-09-19 ENCOUNTER — Ambulatory Visit
Admission: RE | Admit: 2016-09-19 | Discharge: 2016-09-19 | Disposition: A | Discharge status: Home / Self Care | Payer: BLUE CROSS/BLUE SHIELD | Source: Ambulatory Visit | Attending: Family Medicine | Admitting: Family Medicine

## 2016-09-19 ENCOUNTER — Other Ambulatory Visit: Payer: Self-pay | Admitting: Family Medicine

## 2016-09-19 DIAGNOSIS — R200 Anesthesia of skin: Secondary | ICD-10-CM | POA: Diagnosis not present

## 2016-09-19 DIAGNOSIS — M25572 Pain in left ankle and joints of left foot: Principal | ICD-10-CM

## 2016-09-19 DIAGNOSIS — G8929 Other chronic pain: Secondary | ICD-10-CM

## 2016-09-19 DIAGNOSIS — S99912A Unspecified injury of left ankle, initial encounter: Secondary | ICD-10-CM | POA: Diagnosis not present

## 2016-09-20 ENCOUNTER — Ambulatory Visit
Admission: RE | Admit: 2016-09-20 | Discharge: 2016-09-20 | Disposition: A | Discharge status: Home / Self Care | Payer: BLUE CROSS/BLUE SHIELD | Source: Ambulatory Visit | Attending: Family Medicine | Admitting: Family Medicine

## 2016-09-20 ENCOUNTER — Other Ambulatory Visit: Payer: Self-pay | Admitting: Family Medicine

## 2016-09-20 ENCOUNTER — Other Ambulatory Visit: Payer: BLUE CROSS/BLUE SHIELD

## 2016-09-20 DIAGNOSIS — R52 Pain, unspecified: Secondary | ICD-10-CM

## 2016-09-20 DIAGNOSIS — S99922A Unspecified injury of left foot, initial encounter: Secondary | ICD-10-CM | POA: Diagnosis not present

## 2016-09-20 DIAGNOSIS — N912 Amenorrhea, unspecified: Secondary | ICD-10-CM | POA: Diagnosis not present

## 2016-09-20 DIAGNOSIS — M25572 Pain in left ankle and joints of left foot: Secondary | ICD-10-CM | POA: Diagnosis not present

## 2016-09-25 DIAGNOSIS — G4733 Obstructive sleep apnea (adult) (pediatric): Secondary | ICD-10-CM | POA: Diagnosis not present

## 2016-10-04 DIAGNOSIS — G4733 Obstructive sleep apnea (adult) (pediatric): Secondary | ICD-10-CM | POA: Diagnosis not present

## 2016-10-05 DIAGNOSIS — H00025 Hordeolum internum left lower eyelid: Secondary | ICD-10-CM | POA: Diagnosis not present

## 2016-10-09 DIAGNOSIS — Z6841 Body Mass Index (BMI) 40.0 and over, adult: Secondary | ICD-10-CM | POA: Diagnosis not present

## 2016-10-09 DIAGNOSIS — Z01419 Encounter for gynecological examination (general) (routine) without abnormal findings: Secondary | ICD-10-CM | POA: Diagnosis not present

## 2016-11-01 DIAGNOSIS — I10 Essential (primary) hypertension: Secondary | ICD-10-CM | POA: Diagnosis not present

## 2016-11-01 DIAGNOSIS — Z0000 Encounter for general adult medical examination without abnormal findings: Secondary | ICD-10-CM | POA: Diagnosis not present

## 2016-11-04 DIAGNOSIS — G4733 Obstructive sleep apnea (adult) (pediatric): Secondary | ICD-10-CM | POA: Diagnosis not present

## 2016-11-13 DIAGNOSIS — R197 Diarrhea, unspecified: Secondary | ICD-10-CM | POA: Diagnosis not present

## 2016-11-14 DIAGNOSIS — R197 Diarrhea, unspecified: Secondary | ICD-10-CM | POA: Diagnosis not present

## 2016-11-16 DIAGNOSIS — K529 Noninfective gastroenteritis and colitis, unspecified: Secondary | ICD-10-CM | POA: Diagnosis not present

## 2016-11-17 ENCOUNTER — Other Ambulatory Visit: Payer: Self-pay | Admitting: Family Medicine

## 2016-11-17 DIAGNOSIS — N183 Chronic kidney disease, stage 3 unspecified: Secondary | ICD-10-CM

## 2016-11-21 ENCOUNTER — Ambulatory Visit
Admission: RE | Admit: 2016-11-21 | Discharge: 2016-11-21 | Disposition: A | Discharge status: Home / Self Care | Payer: BLUE CROSS/BLUE SHIELD | Source: Ambulatory Visit | Attending: Family Medicine | Admitting: Family Medicine

## 2016-11-21 DIAGNOSIS — N183 Chronic kidney disease, stage 3 unspecified: Secondary | ICD-10-CM

## 2016-11-24 DIAGNOSIS — N979 Female infertility, unspecified: Secondary | ICD-10-CM | POA: Diagnosis not present

## 2016-12-05 DIAGNOSIS — G4733 Obstructive sleep apnea (adult) (pediatric): Secondary | ICD-10-CM | POA: Diagnosis not present

## 2017-01-04 DIAGNOSIS — G4733 Obstructive sleep apnea (adult) (pediatric): Secondary | ICD-10-CM | POA: Diagnosis not present

## 2017-02-19 DIAGNOSIS — I10 Essential (primary) hypertension: Secondary | ICD-10-CM | POA: Diagnosis not present

## 2017-02-28 DIAGNOSIS — N979 Female infertility, unspecified: Secondary | ICD-10-CM | POA: Diagnosis not present

## 2017-03-09 DIAGNOSIS — N979 Female infertility, unspecified: Secondary | ICD-10-CM | POA: Diagnosis not present

## 2017-03-16 DIAGNOSIS — N979 Female infertility, unspecified: Secondary | ICD-10-CM | POA: Diagnosis not present

## 2017-04-13 DIAGNOSIS — N979 Female infertility, unspecified: Secondary | ICD-10-CM | POA: Diagnosis not present

## 2017-06-08 DIAGNOSIS — N979 Female infertility, unspecified: Secondary | ICD-10-CM | POA: Diagnosis not present

## 2017-06-12 DIAGNOSIS — Z3149 Encounter for other procreative investigation and testing: Secondary | ICD-10-CM | POA: Diagnosis not present

## 2017-06-12 DIAGNOSIS — Z6841 Body Mass Index (BMI) 40.0 and over, adult: Secondary | ICD-10-CM | POA: Diagnosis not present

## 2017-06-15 DIAGNOSIS — N289 Disorder of kidney and ureter, unspecified: Secondary | ICD-10-CM | POA: Diagnosis not present

## 2017-06-15 DIAGNOSIS — I10 Essential (primary) hypertension: Secondary | ICD-10-CM | POA: Diagnosis not present

## 2017-06-15 DIAGNOSIS — Z720 Tobacco use: Secondary | ICD-10-CM | POA: Diagnosis not present

## 2017-06-17 DIAGNOSIS — M25519 Pain in unspecified shoulder: Secondary | ICD-10-CM | POA: Diagnosis not present

## 2017-06-26 DIAGNOSIS — N979 Female infertility, unspecified: Secondary | ICD-10-CM | POA: Diagnosis not present

## 2017-06-29 DIAGNOSIS — N289 Disorder of kidney and ureter, unspecified: Secondary | ICD-10-CM | POA: Diagnosis not present

## 2017-07-12 DIAGNOSIS — N979 Female infertility, unspecified: Secondary | ICD-10-CM | POA: Diagnosis not present

## 2017-07-19 DIAGNOSIS — N979 Female infertility, unspecified: Secondary | ICD-10-CM | POA: Diagnosis not present

## 2017-09-19 DIAGNOSIS — H66003 Acute suppurative otitis media without spontaneous rupture of ear drum, bilateral: Secondary | ICD-10-CM | POA: Diagnosis not present

## 2017-09-19 DIAGNOSIS — J019 Acute sinusitis, unspecified: Secondary | ICD-10-CM | POA: Diagnosis not present

## 2017-10-15 DIAGNOSIS — Z01419 Encounter for gynecological examination (general) (routine) without abnormal findings: Secondary | ICD-10-CM | POA: Diagnosis not present

## 2017-10-15 DIAGNOSIS — Z3200 Encounter for pregnancy test, result unknown: Secondary | ICD-10-CM | POA: Diagnosis not present

## 2017-10-15 DIAGNOSIS — Z6841 Body Mass Index (BMI) 40.0 and over, adult: Secondary | ICD-10-CM | POA: Diagnosis not present

## 2017-11-01 DIAGNOSIS — E669 Obesity, unspecified: Secondary | ICD-10-CM | POA: Diagnosis not present

## 2017-11-01 DIAGNOSIS — Z720 Tobacco use: Secondary | ICD-10-CM | POA: Diagnosis not present

## 2017-11-01 DIAGNOSIS — R7989 Other specified abnormal findings of blood chemistry: Secondary | ICD-10-CM | POA: Diagnosis not present

## 2017-11-01 DIAGNOSIS — I129 Hypertensive chronic kidney disease with stage 1 through stage 4 chronic kidney disease, or unspecified chronic kidney disease: Secondary | ICD-10-CM | POA: Diagnosis not present

## 2017-11-05 DIAGNOSIS — R7989 Other specified abnormal findings of blood chemistry: Secondary | ICD-10-CM | POA: Diagnosis not present

## 2017-11-15 DIAGNOSIS — I10 Essential (primary) hypertension: Secondary | ICD-10-CM | POA: Diagnosis not present

## 2017-11-15 DIAGNOSIS — Z0000 Encounter for general adult medical examination without abnormal findings: Secondary | ICD-10-CM | POA: Diagnosis not present

## 2018-03-14 DIAGNOSIS — J069 Acute upper respiratory infection, unspecified: Secondary | ICD-10-CM | POA: Diagnosis not present

## 2018-03-14 DIAGNOSIS — R51 Headache: Secondary | ICD-10-CM | POA: Diagnosis not present

## 2018-05-01 DIAGNOSIS — G4733 Obstructive sleep apnea (adult) (pediatric): Secondary | ICD-10-CM | POA: Diagnosis not present

## 2018-06-04 DIAGNOSIS — J0100 Acute maxillary sinusitis, unspecified: Secondary | ICD-10-CM | POA: Diagnosis not present

## 2018-10-16 DIAGNOSIS — J988 Other specified respiratory disorders: Secondary | ICD-10-CM | POA: Diagnosis not present

## 2018-10-16 DIAGNOSIS — R6883 Chills (without fever): Secondary | ICD-10-CM | POA: Diagnosis not present

## 2018-10-16 DIAGNOSIS — J019 Acute sinusitis, unspecified: Secondary | ICD-10-CM | POA: Diagnosis not present

## 2018-11-29 DIAGNOSIS — Z0000 Encounter for general adult medical examination without abnormal findings: Secondary | ICD-10-CM | POA: Diagnosis not present

## 2018-12-11 DIAGNOSIS — E669 Obesity, unspecified: Secondary | ICD-10-CM | POA: Diagnosis not present

## 2018-12-11 DIAGNOSIS — I10 Essential (primary) hypertension: Secondary | ICD-10-CM | POA: Diagnosis not present

## 2019-02-05 DIAGNOSIS — H9201 Otalgia, right ear: Secondary | ICD-10-CM | POA: Diagnosis not present

## 2019-03-04 DIAGNOSIS — Z87891 Personal history of nicotine dependence: Secondary | ICD-10-CM | POA: Diagnosis not present

## 2019-03-04 DIAGNOSIS — F411 Generalized anxiety disorder: Secondary | ICD-10-CM | POA: Diagnosis not present

## 2019-03-14 DIAGNOSIS — Z01419 Encounter for gynecological examination (general) (routine) without abnormal findings: Secondary | ICD-10-CM | POA: Diagnosis not present

## 2019-03-14 DIAGNOSIS — I10 Essential (primary) hypertension: Secondary | ICD-10-CM | POA: Diagnosis present

## 2019-03-14 DIAGNOSIS — Z6841 Body Mass Index (BMI) 40.0 and over, adult: Secondary | ICD-10-CM | POA: Diagnosis not present

## 2019-03-28 IMAGING — US US RENAL
1 series · 14 of 25 positions shown · non-contrast
Comparison: None.

CLINICAL DATA: Chronic kidney disease, stage III

EXAM:
RENAL / URINARY TRACT ULTRASOUND COMPLETE

[Series 1: us renal · 0.25mm/px · 14 of 35 slices shown]
[im 1/35]
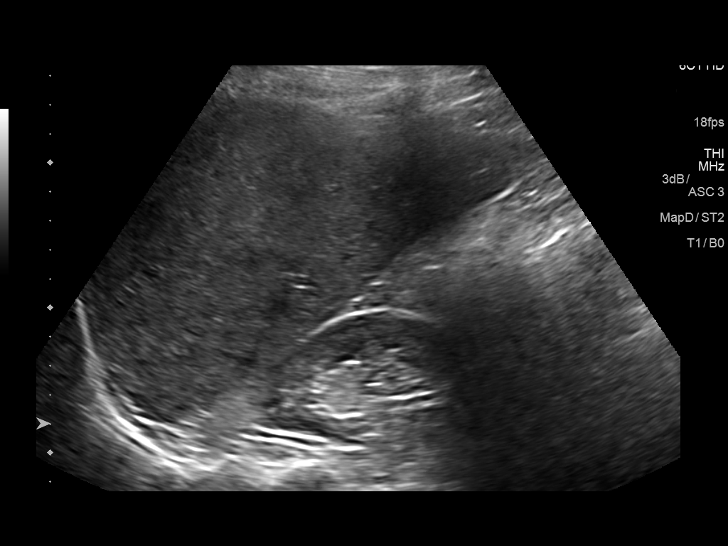
[im 3/35]
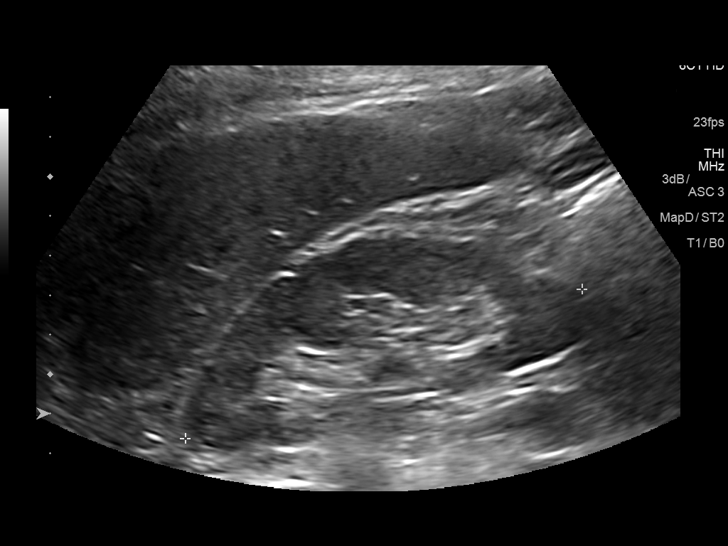
[im 6/35]
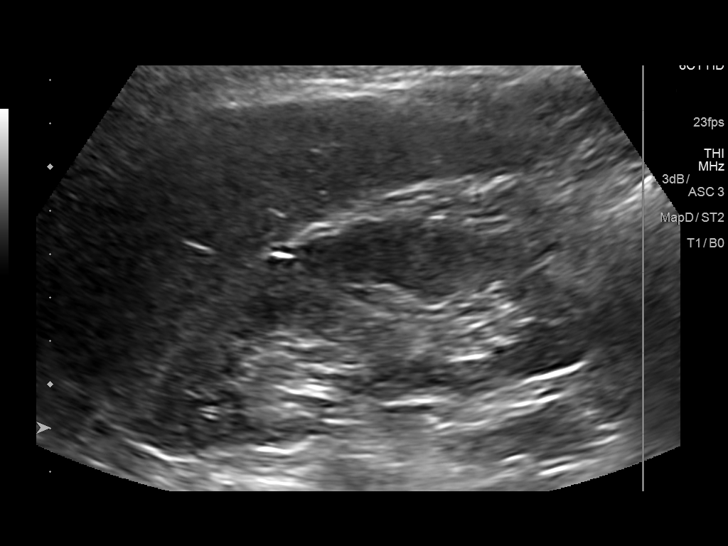
[im 9/35]
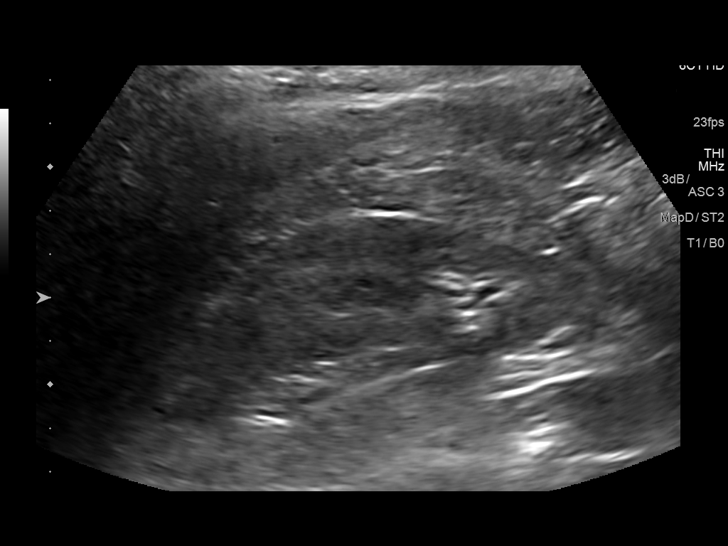
[im 12/35]
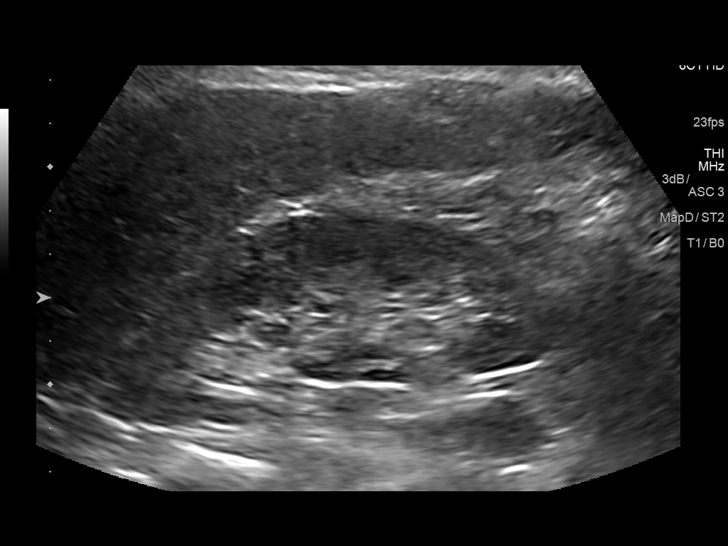
[im 13/35]
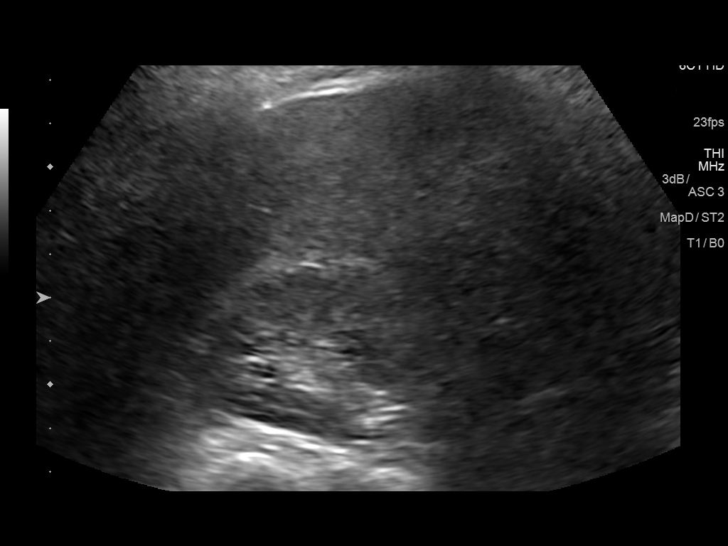
[im 16/35]
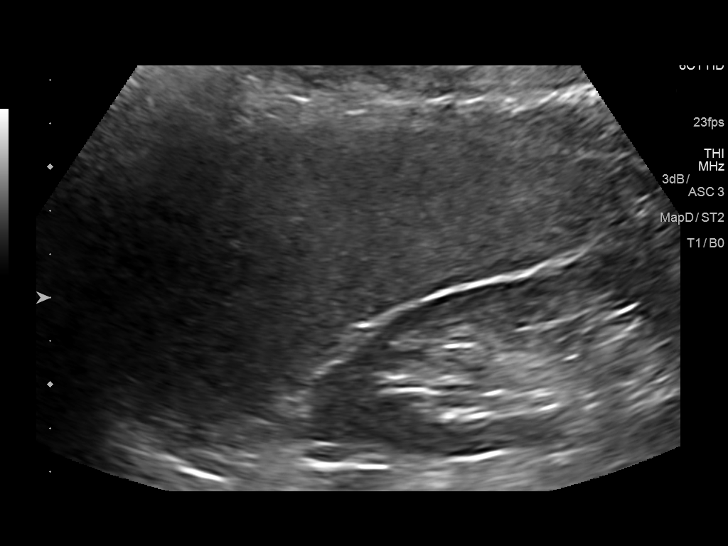
[im 19/35]
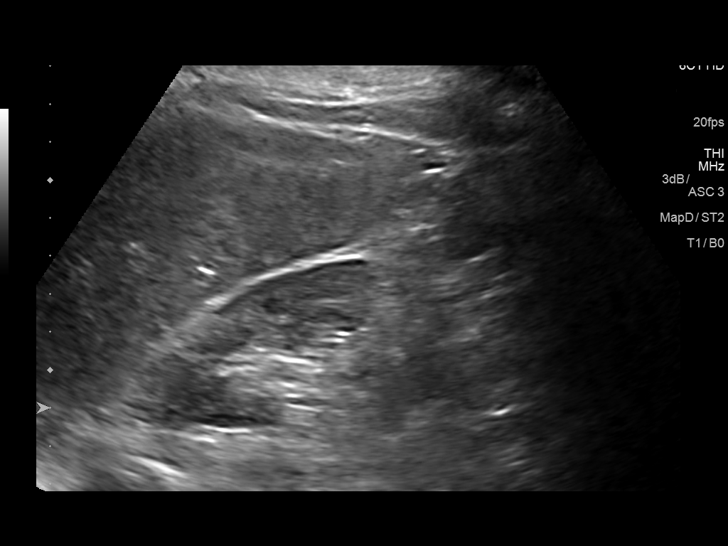
[im 22/35]
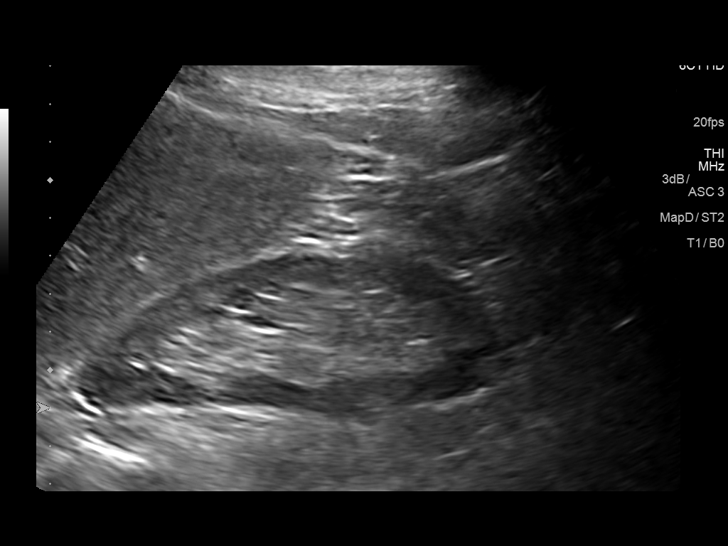
[im 23/35]
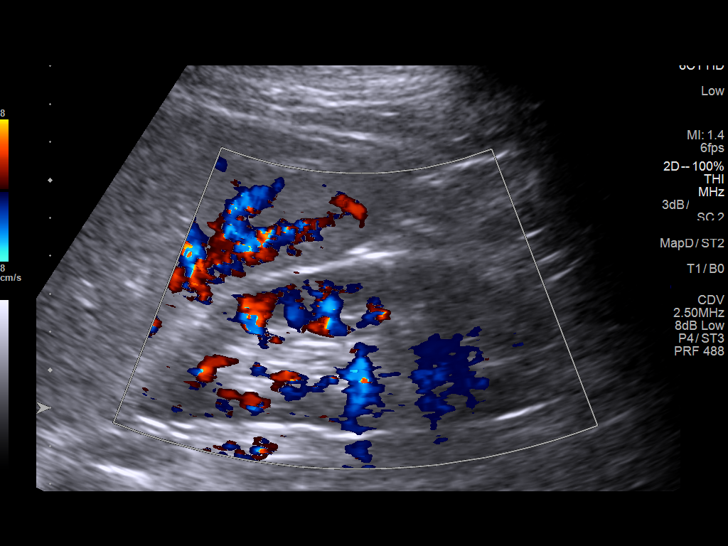
[im 26/35]
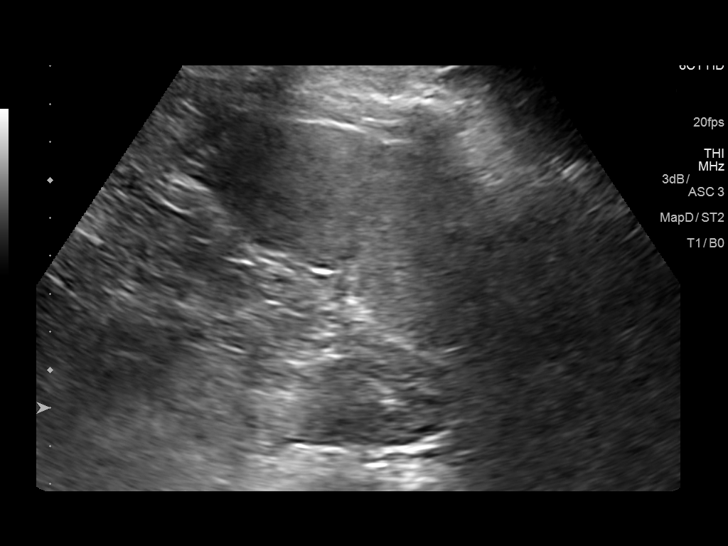
[im 29/35]
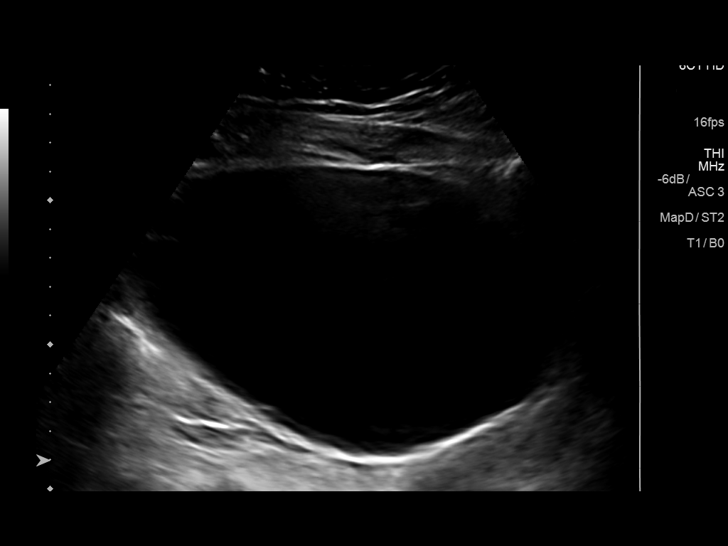
[im 32/35]
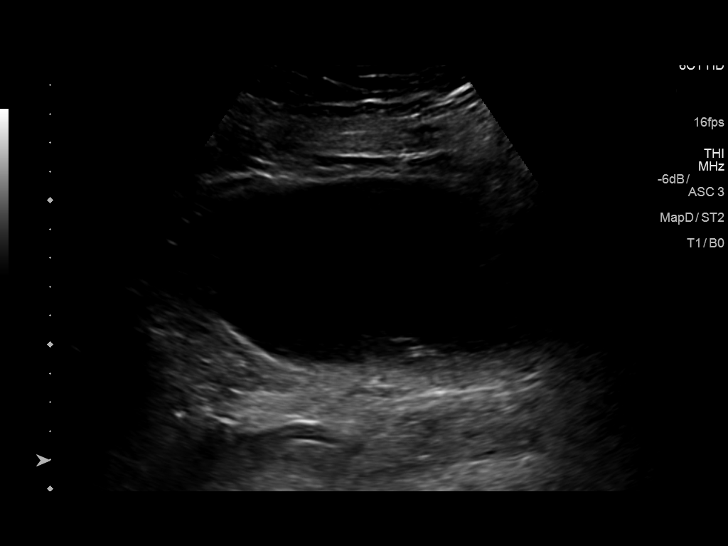
[im 35/35]
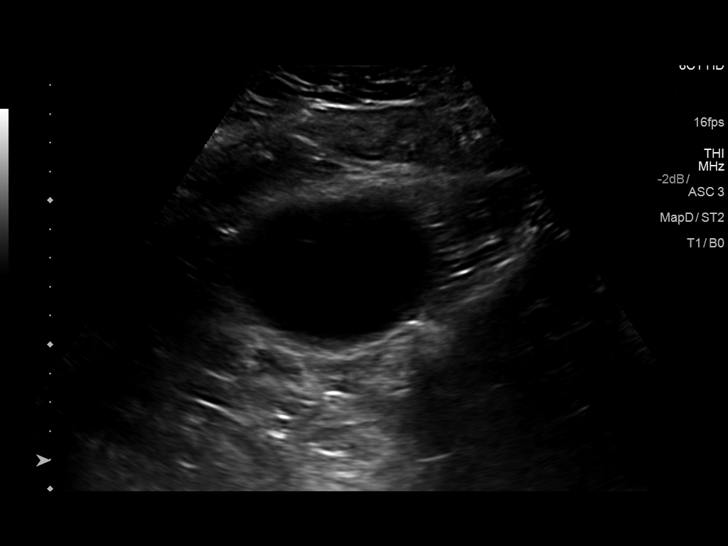

[14 of 25 positions shown; findings below may reference images not displayed]

FINDINGS: Right Kidney:

Length: 10.7 cm.. No hydronephrosis is seen. The echogenicity of the
renal parenchyma appears normal.

Left Kidney:

Length: 11.8 cm.. No hydronephrosis is noted. The left renal
parenchymal echogenicity also is normal.

Bladder:

The urinary bladder is moderately distended with no abnormality
noted.
IMPRESSION: No hydronephrosis. The echogenicity of the renal parenchyma appears
normal bilaterally.

## 2019-05-12 DIAGNOSIS — Z20828 Contact with and (suspected) exposure to other viral communicable diseases: Secondary | ICD-10-CM | POA: Diagnosis not present

## 2019-05-12 DIAGNOSIS — Z03818 Encounter for observation for suspected exposure to other biological agents ruled out: Secondary | ICD-10-CM | POA: Diagnosis not present

## 2019-05-15 DIAGNOSIS — Z20828 Contact with and (suspected) exposure to other viral communicable diseases: Secondary | ICD-10-CM | POA: Diagnosis not present

## 2019-05-15 DIAGNOSIS — Z03818 Encounter for observation for suspected exposure to other biological agents ruled out: Secondary | ICD-10-CM | POA: Diagnosis not present

## 2019-05-28 DIAGNOSIS — Z20828 Contact with and (suspected) exposure to other viral communicable diseases: Secondary | ICD-10-CM | POA: Diagnosis not present

## 2019-05-28 DIAGNOSIS — Z03818 Encounter for observation for suspected exposure to other biological agents ruled out: Secondary | ICD-10-CM | POA: Diagnosis not present

## 2019-06-23 DIAGNOSIS — Z20828 Contact with and (suspected) exposure to other viral communicable diseases: Secondary | ICD-10-CM | POA: Diagnosis not present

## 2019-06-23 DIAGNOSIS — Z03818 Encounter for observation for suspected exposure to other biological agents ruled out: Secondary | ICD-10-CM | POA: Diagnosis not present

## 2019-09-02 DIAGNOSIS — N942 Vaginismus: Secondary | ICD-10-CM | POA: Diagnosis not present

## 2019-09-17 DIAGNOSIS — E288 Other ovarian dysfunction: Secondary | ICD-10-CM | POA: Diagnosis not present

## 2019-09-17 DIAGNOSIS — Z319 Encounter for procreative management, unspecified: Secondary | ICD-10-CM | POA: Diagnosis not present

## 2019-10-09 DIAGNOSIS — Z319 Encounter for procreative management, unspecified: Secondary | ICD-10-CM | POA: Diagnosis not present

## 2019-10-09 DIAGNOSIS — E288 Other ovarian dysfunction: Secondary | ICD-10-CM | POA: Diagnosis not present

## 2019-10-13 DIAGNOSIS — Z319 Encounter for procreative management, unspecified: Secondary | ICD-10-CM | POA: Diagnosis not present

## 2019-10-13 DIAGNOSIS — E288 Other ovarian dysfunction: Secondary | ICD-10-CM | POA: Diagnosis not present

## 2019-11-12 DIAGNOSIS — Z319 Encounter for procreative management, unspecified: Secondary | ICD-10-CM | POA: Diagnosis not present

## 2019-11-12 DIAGNOSIS — E288 Other ovarian dysfunction: Secondary | ICD-10-CM | POA: Diagnosis not present

## 2019-12-03 DIAGNOSIS — Z3201 Encounter for pregnancy test, result positive: Secondary | ICD-10-CM | POA: Diagnosis not present

## 2019-12-03 DIAGNOSIS — Z3200 Encounter for pregnancy test, result unknown: Secondary | ICD-10-CM | POA: Diagnosis not present

## 2019-12-12 DIAGNOSIS — Z319 Encounter for procreative management, unspecified: Secondary | ICD-10-CM | POA: Diagnosis not present

## 2019-12-12 DIAGNOSIS — Z3200 Encounter for pregnancy test, result unknown: Secondary | ICD-10-CM | POA: Diagnosis not present

## 2019-12-29 DIAGNOSIS — O0900 Supervision of pregnancy with history of infertility, unspecified trimester: Secondary | ICD-10-CM | POA: Diagnosis not present

## 2020-01-13 DIAGNOSIS — Z3481 Encounter for supervision of other normal pregnancy, first trimester: Secondary | ICD-10-CM | POA: Diagnosis not present

## 2020-01-13 DIAGNOSIS — Z3480 Encounter for supervision of other normal pregnancy, unspecified trimester: Secondary | ICD-10-CM | POA: Diagnosis not present

## 2020-01-13 DIAGNOSIS — Z3685 Encounter for antenatal screening for Streptococcus B: Secondary | ICD-10-CM | POA: Diagnosis not present

## 2020-01-26 DIAGNOSIS — Z6830 Body mass index (BMI) 30.0-30.9, adult: Secondary | ICD-10-CM | POA: Diagnosis not present

## 2020-01-26 DIAGNOSIS — Z3A12 12 weeks gestation of pregnancy: Secondary | ICD-10-CM | POA: Diagnosis not present

## 2020-01-26 DIAGNOSIS — Z3682 Encounter for antenatal screening for nuchal translucency: Secondary | ICD-10-CM | POA: Diagnosis not present

## 2020-02-03 DIAGNOSIS — M109 Gout, unspecified: Secondary | ICD-10-CM | POA: Diagnosis not present

## 2020-02-03 DIAGNOSIS — Z23 Encounter for immunization: Secondary | ICD-10-CM | POA: Diagnosis not present

## 2020-02-03 DIAGNOSIS — M79675 Pain in left toe(s): Secondary | ICD-10-CM | POA: Diagnosis not present

## 2020-02-04 DIAGNOSIS — Z3400 Encounter for supervision of normal first pregnancy, unspecified trimester: Secondary | ICD-10-CM | POA: Diagnosis not present

## 2020-02-04 DIAGNOSIS — Z01419 Encounter for gynecological examination (general) (routine) without abnormal findings: Secondary | ICD-10-CM | POA: Diagnosis not present

## 2020-02-04 DIAGNOSIS — Z113 Encounter for screening for infections with a predominantly sexual mode of transmission: Secondary | ICD-10-CM | POA: Diagnosis not present

## 2020-03-09 DIAGNOSIS — Z23 Encounter for immunization: Secondary | ICD-10-CM | POA: Diagnosis not present

## 2020-03-11 DIAGNOSIS — O10012 Pre-existing essential hypertension complicating pregnancy, second trimester: Secondary | ICD-10-CM | POA: Diagnosis not present

## 2020-03-11 DIAGNOSIS — Z363 Encounter for antenatal screening for malformations: Secondary | ICD-10-CM | POA: Diagnosis not present

## 2020-03-11 DIAGNOSIS — O99212 Obesity complicating pregnancy, second trimester: Secondary | ICD-10-CM | POA: Diagnosis not present

## 2020-03-11 DIAGNOSIS — Z361 Encounter for antenatal screening for raised alphafetoprotein level: Secondary | ICD-10-CM | POA: Diagnosis not present

## 2020-03-11 DIAGNOSIS — Z3A18 18 weeks gestation of pregnancy: Secondary | ICD-10-CM | POA: Diagnosis not present

## 2020-04-04 DIAGNOSIS — R059 Cough, unspecified: Secondary | ICD-10-CM | POA: Diagnosis not present

## 2020-04-04 DIAGNOSIS — Z3A22 22 weeks gestation of pregnancy: Secondary | ICD-10-CM | POA: Diagnosis not present

## 2020-04-04 DIAGNOSIS — J029 Acute pharyngitis, unspecified: Secondary | ICD-10-CM | POA: Diagnosis not present

## 2020-04-14 DIAGNOSIS — R059 Cough, unspecified: Secondary | ICD-10-CM | POA: Diagnosis not present

## 2020-04-19 DIAGNOSIS — U071 COVID-19: Secondary | ICD-10-CM | POA: Diagnosis not present

## 2020-04-21 DIAGNOSIS — L2089 Other atopic dermatitis: Secondary | ICD-10-CM | POA: Diagnosis not present

## 2020-04-23 DIAGNOSIS — Z3A24 24 weeks gestation of pregnancy: Secondary | ICD-10-CM | POA: Diagnosis not present

## 2020-04-23 DIAGNOSIS — Z362 Encounter for other antenatal screening follow-up: Secondary | ICD-10-CM | POA: Diagnosis not present

## 2020-05-14 DIAGNOSIS — Z3480 Encounter for supervision of other normal pregnancy, unspecified trimester: Secondary | ICD-10-CM | POA: Diagnosis not present

## 2020-05-14 DIAGNOSIS — Z23 Encounter for immunization: Secondary | ICD-10-CM | POA: Diagnosis not present

## 2020-05-19 ENCOUNTER — Other Ambulatory Visit: Payer: Self-pay | Admitting: Obstetrics and Gynecology

## 2020-05-19 DIAGNOSIS — L2089 Other atopic dermatitis: Secondary | ICD-10-CM | POA: Diagnosis not present

## 2020-05-19 DIAGNOSIS — Z363 Encounter for antenatal screening for malformations: Secondary | ICD-10-CM

## 2020-05-19 DIAGNOSIS — Z3A30 30 weeks gestation of pregnancy: Secondary | ICD-10-CM

## 2020-05-25 ENCOUNTER — Encounter: Payer: Self-pay | Admitting: *Deleted

## 2020-06-01 ENCOUNTER — Ambulatory Visit: Payer: BC Managed Care – PPO | Admitting: *Deleted

## 2020-06-01 ENCOUNTER — Other Ambulatory Visit: Payer: Self-pay

## 2020-06-01 ENCOUNTER — Encounter: Payer: Self-pay | Admitting: *Deleted

## 2020-06-01 ENCOUNTER — Ambulatory Visit: Payer: BC Managed Care – PPO | Attending: Obstetrics and Gynecology

## 2020-06-01 VITALS — BP 122/66 | HR 80

## 2020-06-01 DIAGNOSIS — Z3A30 30 weeks gestation of pregnancy: Secondary | ICD-10-CM | POA: Insufficient documentation

## 2020-06-01 DIAGNOSIS — I10 Essential (primary) hypertension: Secondary | ICD-10-CM | POA: Diagnosis not present

## 2020-06-01 DIAGNOSIS — Z363 Encounter for antenatal screening for malformations: Secondary | ICD-10-CM | POA: Diagnosis not present

## 2020-06-14 DIAGNOSIS — Z3A32 32 weeks gestation of pregnancy: Secondary | ICD-10-CM | POA: Diagnosis not present

## 2020-06-14 DIAGNOSIS — O10013 Pre-existing essential hypertension complicating pregnancy, third trimester: Secondary | ICD-10-CM | POA: Diagnosis not present

## 2020-06-22 DIAGNOSIS — Z3A33 33 weeks gestation of pregnancy: Secondary | ICD-10-CM | POA: Diagnosis not present

## 2020-06-22 DIAGNOSIS — O10013 Pre-existing essential hypertension complicating pregnancy, third trimester: Secondary | ICD-10-CM | POA: Diagnosis not present

## 2020-06-30 DIAGNOSIS — Z3A34 34 weeks gestation of pregnancy: Secondary | ICD-10-CM | POA: Diagnosis not present

## 2020-06-30 DIAGNOSIS — O99213 Obesity complicating pregnancy, third trimester: Secondary | ICD-10-CM | POA: Diagnosis not present

## 2020-06-30 DIAGNOSIS — R030 Elevated blood-pressure reading, without diagnosis of hypertension: Secondary | ICD-10-CM | POA: Diagnosis not present

## 2020-06-30 DIAGNOSIS — O10013 Pre-existing essential hypertension complicating pregnancy, third trimester: Secondary | ICD-10-CM | POA: Diagnosis not present

## 2020-06-30 DIAGNOSIS — F411 Generalized anxiety disorder: Secondary | ICD-10-CM | POA: Diagnosis not present

## 2020-06-30 DIAGNOSIS — O09523 Supervision of elderly multigravida, third trimester: Secondary | ICD-10-CM | POA: Diagnosis not present

## 2020-07-04 ENCOUNTER — Other Ambulatory Visit: Payer: Self-pay

## 2020-07-04 ENCOUNTER — Inpatient Hospital Stay (HOSPITAL_COMMUNITY)
Admission: AD | Admit: 2020-07-04 | Discharge: 2020-07-08 | DRG: 788 | Disposition: A | Discharge status: Home / Self Care | Payer: BC Managed Care – PPO | Attending: Obstetrics and Gynecology | Admitting: Obstetrics and Gynecology

## 2020-07-04 ENCOUNTER — Encounter (HOSPITAL_COMMUNITY): Payer: Self-pay | Admitting: Obstetrics and Gynecology

## 2020-07-04 DIAGNOSIS — Z3A35 35 weeks gestation of pregnancy: Secondary | ICD-10-CM | POA: Diagnosis not present

## 2020-07-04 DIAGNOSIS — O10913 Unspecified pre-existing hypertension complicating pregnancy, third trimester: Secondary | ICD-10-CM

## 2020-07-04 DIAGNOSIS — O26893 Other specified pregnancy related conditions, third trimester: Secondary | ICD-10-CM | POA: Diagnosis not present

## 2020-07-04 DIAGNOSIS — O09523 Supervision of elderly multigravida, third trimester: Secondary | ICD-10-CM | POA: Diagnosis not present

## 2020-07-04 DIAGNOSIS — R519 Headache, unspecified: Secondary | ICD-10-CM | POA: Diagnosis not present

## 2020-07-04 DIAGNOSIS — O10919 Unspecified pre-existing hypertension complicating pregnancy, unspecified trimester: Secondary | ICD-10-CM | POA: Diagnosis present

## 2020-07-04 DIAGNOSIS — Z87891 Personal history of nicotine dependence: Secondary | ICD-10-CM | POA: Diagnosis not present

## 2020-07-04 DIAGNOSIS — O10013 Pre-existing essential hypertension complicating pregnancy, third trimester: Secondary | ICD-10-CM | POA: Diagnosis not present

## 2020-07-04 DIAGNOSIS — Z20822 Contact with and (suspected) exposure to covid-19: Secondary | ICD-10-CM | POA: Diagnosis present

## 2020-07-04 DIAGNOSIS — O99344 Other mental disorders complicating childbirth: Secondary | ICD-10-CM | POA: Diagnosis not present

## 2020-07-04 DIAGNOSIS — O99213 Obesity complicating pregnancy, third trimester: Secondary | ICD-10-CM | POA: Diagnosis not present

## 2020-07-04 DIAGNOSIS — F419 Anxiety disorder, unspecified: Secondary | ICD-10-CM | POA: Diagnosis present

## 2020-07-04 DIAGNOSIS — E669 Obesity, unspecified: Secondary | ICD-10-CM | POA: Diagnosis not present

## 2020-07-04 DIAGNOSIS — O1002 Pre-existing essential hypertension complicating childbirth: Secondary | ICD-10-CM | POA: Diagnosis not present

## 2020-07-04 LAB — TYPE AND SCREEN
ABO/RH(D): A POS
Antibody Screen: NEGATIVE

## 2020-07-04 LAB — COMPREHENSIVE METABOLIC PANEL
ALT: 19 U/L (ref 0–44)
AST: 17 U/L (ref 15–41)
Albumin: 2.9 g/dL — ABNORMAL LOW (ref 3.5–5.0)
Alkaline Phosphatase: 77 U/L (ref 38–126)
Anion gap: 9 (ref 5–15)
BUN: 11 mg/dL (ref 6–20)
CO2: 23 mmol/L (ref 22–32)
Calcium: 9.1 mg/dL (ref 8.9–10.3)
Chloride: 104 mmol/L (ref 98–111)
Creatinine, Ser: 1.22 mg/dL — ABNORMAL HIGH (ref 0.44–1.00)
GFR, Estimated: 59 mL/min — ABNORMAL LOW (ref >60)
Glucose, Bld: 91 mg/dL (ref 70–99)
Potassium: 4.2 mmol/L (ref 3.5–5.1)
Sodium: 136 mmol/L (ref 135–145)
Total Bilirubin: 0.5 mg/dL (ref 0.3–1.2)
Total Protein: 6.1 g/dL — ABNORMAL LOW (ref 6.5–8.1)

## 2020-07-04 LAB — CBC
HCT: 38 % (ref 36.0–46.0)
Hemoglobin: 12.7 g/dL (ref 12.0–15.0)
MCH: 28 pg (ref 26.0–34.0)
MCHC: 33.4 g/dL (ref 30.0–36.0)
MCV: 83.7 fL (ref 80.0–100.0)
Platelets: 176 10*3/uL (ref 150–400)
RBC: 4.54 MIL/uL (ref 3.87–5.11)
RDW: 13.7 % (ref 11.5–15.5)
WBC: 11.9 10*3/uL — ABNORMAL HIGH (ref 4.0–10.5)
nRBC: 0 % (ref 0.0–0.2)

## 2020-07-04 LAB — URINALYSIS, ROUTINE W REFLEX MICROSCOPIC
Bilirubin Urine: NEGATIVE
Glucose, UA: NEGATIVE mg/dL
Hgb urine dipstick: NEGATIVE
Ketones, ur: NEGATIVE mg/dL
Leukocytes,Ua: NEGATIVE
Nitrite: NEGATIVE
Protein, ur: NEGATIVE mg/dL
Specific Gravity, Urine: 1.006 (ref 1.005–1.030)
pH: 6 (ref 5.0–8.0)

## 2020-07-04 LAB — PROTEIN / CREATININE RATIO, URINE
Creatinine, Urine: 43.79 mg/dL
Total Protein, Urine: <6 mg/dL

## 2020-07-04 LAB — RESP PANEL BY RT-PCR (FLU A&B, COVID) ARPGX2
Influenza A by PCR: NEGATIVE
Influenza B by PCR: NEGATIVE
SARS Coronavirus 2 by RT PCR: NEGATIVE

## 2020-07-04 MED ORDER — CALCIUM CARBONATE ANTACID 500 MG PO CHEW
2.0000 | CHEWABLE_TABLET | ORAL | Status: DC | PRN
  Administered 2020-07-05 (×2): 400 mg via ORAL
  Filled 2020-07-04 (×2): qty 2

## 2020-07-04 MED ORDER — BETAMETHASONE SOD PHOS & ACET 6 (3-3) MG/ML IJ SUSP
12.0000 mg | INTRAMUSCULAR | Status: DC
  Administered 2020-07-04: 12 mg via INTRAMUSCULAR
  Filled 2020-07-04: qty 5
  Filled 2020-07-04: qty 2

## 2020-07-04 MED ORDER — SODIUM CHLORIDE 0.9 % IV SOLN: 250.0000 mL | INTRAVENOUS | Status: DC | PRN

## 2020-07-04 MED ORDER — LABETALOL HCL 100 MG PO TABS
200.0000 mg | ORAL_TABLET | Freq: Once | ORAL | Status: AC
  Administered 2020-07-04: 200 mg via ORAL
  Filled 2020-07-04: qty 2

## 2020-07-04 MED ORDER — ACETAMINOPHEN 325 MG PO TABS: 650.0000 mg | ORAL_TABLET | ORAL | Status: DC | PRN

## 2020-07-04 MED ORDER — SODIUM CHLORIDE 0.9% FLUSH: 3.0000 mL | INTRAVENOUS | Status: DC | PRN

## 2020-07-04 MED ORDER — ZOLPIDEM TARTRATE 5 MG PO TABS: 5.0000 mg | ORAL_TABLET | Freq: Every evening | ORAL | Status: DC | PRN

## 2020-07-04 MED ORDER — BUTALBITAL-APAP-CAFFEINE 50-325-40 MG PO TABS
2.0000 | ORAL_TABLET | Freq: Once | ORAL | Status: AC
  Administered 2020-07-04: 2 via ORAL
  Filled 2020-07-04: qty 2

## 2020-07-04 MED ORDER — PRENATAL MULTIVITAMIN CH: 1.0000 | ORAL_TABLET | Freq: Every day | ORAL | Status: DC

## 2020-07-04 MED ORDER — DOCUSATE SODIUM 100 MG PO CAPS
100.0000 mg | ORAL_CAPSULE | Freq: Every day | ORAL | Status: DC
  Administered 2020-07-04: 100 mg via ORAL
  Filled 2020-07-04 (×2): qty 1

## 2020-07-04 NOTE — MAU Note (Addendum)
Pt stated she has had a dull headache off and on all day took tylenol twice and it dulled it but never has completely gone away. Called on call nurse and told to check her b/p. It was elevated 176/117. but pt said she had been up and moving around a lot today. Took it again a few minutes later and it was  115/76 Told to come in. Pt on labetalol

## 2020-07-04 NOTE — H&P (Addendum)
Nancy Rivera is a f20 year old G 2 P 1 at 21 w 1 day presents for admission overnight  She has known chronic hypertension - on labetalol 0 - today developed a headache - BPs at home very high - came to [facility]au and BPs not in severe range  However - in workup labs showed serum cr of 1.22  Admitted for serial bps overnight and repeat labs. OB History    Gravida  2   Para  1   Term  1   Preterm      AB      Living  1     SAB      IAB      Ectopic      Multiple      Live Births  1          Past Medical History:  Diagnosis Date  . Anxiety   . Hypertension   . Obesity   . Oligomenorrhea   . PCOS (polycystic ovarian syndrome)    Past Surgical History:  Procedure Laterality Date  . Benign cyst removed from eyebrow as an infant    . REMOVAL OF CYST  1985   FROM UNDER LEFT EYEBROW   Family History: family history includes Cancer in her maternal grandfather and paternal grandfather. Social History:  reports that she has quit smoking. She smoked 0.50 packs per day. She has never used smokeless tobacco. She reports that she does not drink alcohol and does not use drugs.     Maternal Diabetes: No Genetic Screening: Normal Maternal Ultrasounds/Referrals: Normal Fetal Ultrasounds or other Referrals:  None Maternal Substance Abuse:  No Significant Maternal Medications:  Meds include: Other:  Significant Maternal Lab Results:  None Other Comments:  None  Review of Systems  All other systems reviewed and are negative.  History   Blood pressure 135/78, pulse 77, temperature 98.4 F (36.9 C), resp. rate 18, height 5\' 9"  (1.753 m), weight (!) 148 kg, last menstrual period 11/01/2019, unknown if currently breastfeeding. Maternal Exam:  Uterine Assessment: Contraction frequency is regular.      Fetal Exam Fetal State Assessment: Category I - tracings are normal.     Physical Exam  Prenatal labs: ABO, Rh:   Antibody:   Rubella:   RPR:    HBsAg:    HIV:     GBS:      Results for orders placed or performed during the hospital encounter of 07/04/20 (from the past 24 hour(s))  Urinalysis, Routine w reflex microscopic Urine, Clean Catch     Status: None   Collection Time: 07/04/20  7:50 PM  Result Value Ref Range   Color, Urine YELLOW YELLOW   APPearance CLEAR CLEAR   Specific Gravity, Urine 1.006 1.005 - 1.030   pH 6.0 5.0 - 8.0   Glucose, UA NEGATIVE NEGATIVE mg/dL   Hgb urine dipstick NEGATIVE NEGATIVE   Bilirubin Urine NEGATIVE NEGATIVE   Ketones, ur NEGATIVE NEGATIVE mg/dL   Protein, ur NEGATIVE NEGATIVE mg/dL   Nitrite NEGATIVE NEGATIVE   Leukocytes,Ua NEGATIVE NEGATIVE  Protein / creatinine ratio, urine     Status: None   Collection Time: 07/04/20  7:50 PM  Result Value Ref Range   Creatinine, Urine 43.79 mg/dL   Total Protein, Urine <6 mg/dL   Protein Creatinine Ratio        0.00 - 0.15 mg/mg[Cre]  CBC     Status: Abnormal   Collection Time: 07/04/20  8:07 PM  Result  Value Ref Range   WBC 11.9 (H) 4.0 - 10.5 K/uL   RBC 4.54 3.87 - 5.11 MIL/uL   Hemoglobin 12.7 12.0 - 15.0 g/dL   HCT 38.0 36.0 - 46.0 %   MCV 83.7 80.0 - 100.0 fL   MCH 28.0 26.0 - 34.0 pg   MCHC 33.4 30.0 - 36.0 g/dL   RDW 13.7 11.5 - 15.5 %   Platelets 176 150 - 400 K/uL   nRBC 0.0 0.0 - 0.2 %  Comprehensive metabolic panel     Status: Abnormal   Collection Time: 07/04/20  8:07 PM  Result Value Ref Range   Sodium 136 135 - 145 mmol/L   Potassium 4.2 3.5 - 5.1 mmol/L   Chloride 104 98 - 111 mmol/L   CO2 23 22 - 32 mmol/L   Glucose, Bld 91 70 - 99 mg/dL   BUN 11 6 - 20 mg/dL   Creatinine, Ser 1.22 (H) 0.44 - 1.00 mg/dL   Calcium 9.1 8.9 - 10.3 mg/dL   Total Protein 6.1 (L) 6.5 - 8.1 g/dL   Albumin 2.9 (L) 3.5 - 5.0 g/dL   AST 17 15 - 41 U/L   ALT 19 0 - 44 U/L   Alkaline Phosphatase 77 38 - 126 U/L   Total Bilirubin 0.5 0.3 - 1.2 mg/dL   GFR, Estimated 59 (L) >60 mL/min   Anion gap 9 5 - 15  General alert and oriented Lung CTAB Car  RRR Abdomen is soft and non tender Pelvic is deferred  S = D   Assessment/Plan: IUP at 35 w 1 day  Chronic hypertension Elevated serum creatinine - unsure if baseline or new finding  Admit overnight Steroids Serial bps Continue Labetalol Ultrasound for growth tomorrow Recheck cmet and cbc tomorrow morning   Cyril Mourning 07/04/2020, 9:17 PM

## 2020-07-04 NOTE — MAU Provider Note (Signed)
History     CSN: [id number]  Arrival date and time: 07/04/20 1849   Event Date/Time   First Provider Initiated Contact with Patient 07/04/20 2011      Chief Complaint  Patient presents with  . Headache  . Hypertension   Nancy Rivera is a 35 y.o. G2P1001 at [redacted]w[redacted]d who receives care at [facility].  She presents today for Headache and Hypertension.  Patient states she woke up around 0630 with a headache and took tylenol "a couple of times and it takes the edge off, but still lingering."  She reports she took 1000mg  at 0700 and 1300.  She states at the time she rated the headache about 5/10.  She states it went down to about a 3/10 and reports that this is current.  She states she took her blood pressure around 1800 and it was 176/116.  She states the headache at the time was "still dull." She states she also thinks she put the cuff on wrong.  She endorses taking labetalol 200mg  twice daily.  She states she has not taken her 2nd dose and usually takes it around 2000.  She endorses fetal movement and reports occasional BH contractions.  She denies vaginal bleeding, leaking, or discharge that is of concern.    OB History    Gravida  2   Para  1   Term  1   Preterm      AB      Living  1     SAB      IAB      Ectopic      Multiple      Live Births  1           Past Medical History:  Diagnosis Date  . Anxiety   . Hypertension   . Obesity   . Oligomenorrhea   . PCOS (polycystic ovarian syndrome)     Past Surgical History:  Procedure Laterality Date  . Benign cyst removed from eyebrow as an infant    . REMOVAL OF CYST  1985   FROM UNDER LEFT EYEBROW    Family History  Problem Relation Age of Onset  . Cancer Maternal Grandfather        LUNG  . Cancer Paternal Grandfather        LYMPHOMA    Social History   Tobacco Use  . Smoking status: Former Smoker    Packs/day: 0.50  . Smokeless tobacco: Never Used  Vaping Use  . Vaping Use: Never  used  Substance Use Topics  . Alcohol use: No  . Drug use: No    Allergies: No Known Allergies  Medications Prior to Admission  Medication Sig Dispense Refill Last Dose  . acetaminophen (TYLENOL) 500 MG tablet Take 1,000 mg by mouth every 6 (six) hours as needed for mild pain or headache.   07/04/2020 at Unknown time  . aspirin EC 81 MG tablet Take 81 mg by mouth daily. Swallow whole.   07/04/2020 at Unknown time  . busPIRone (BUSPAR) 7.5 MG tablet Take 7.5 mg by mouth 2 (two) times daily.   07/04/2020 at Unknown time  . docusate sodium (COLACE) 100 MG capsule Take 100 mg by mouth at bedtime.    07/03/2020 at Unknown time  . LABETALOL HCL PO Take by mouth.   07/04/2020 at Unknown time  . Prenatal Vit-Fe Fumarate-FA (PRENATAL MULTIVITAMIN) TABS tablet Take 1 tablet by mouth at bedtime.    07/04/2020 at Unknown time  .  ranitidine (ZANTAC) 150 MG tablet Take 150 mg by mouth daily as needed for heartburn.   Past Week at Unknown time    Review of Systems  Constitutional: Negative for chills and fever.  Eyes: Negative for visual disturbance.  Respiratory: Negative for cough and shortness of breath.   Gastrointestinal: Negative for abdominal pain, nausea and vomiting.  Genitourinary: Negative for difficulty urinating, dysuria, vaginal bleeding and vaginal discharge.  Neurological: Positive for headaches. Negative for dizziness and light-headedness.   Physical Exam   Blood pressure 140/87, pulse 73, temperature 98.4 F (36.9 C), resp. rate 18, height 5\' 9"  (1.753 m), weight (!) 148 kg, last menstrual period 11/01/2019, unknown if currently breastfeeding.  Physical Exam Constitutional:      Appearance: Normal appearance. She is well-developed.  HENT:     Head: Normocephalic and atraumatic.  Eyes:     Conjunctiva/sclera: Conjunctivae normal.  Cardiovascular:     Rate and Rhythm: Normal rate and regular rhythm.     Heart sounds: Normal heart sounds.  Pulmonary:     Effort: Pulmonary effort  is normal. No respiratory distress.     Breath sounds: Normal breath sounds.  Abdominal:     Palpations: Abdomen is soft.     Comments: Gravid  Musculoskeletal:     Cervical back: Normal range of motion.     Right lower leg: Edema (+2) present.     Left lower leg: Edema (+2) present.  Skin:    General: Skin is warm and dry.  Neurological:     Mental Status: She is alert and oriented to person, place, and time.  Psychiatric:        Mood and Affect: Mood normal.        Behavior: Behavior normal.     Fetal Assessment 135 bpm, Mod Var, -Decels, +15x15 Accels Toco: No ctx graphed  MAU Course   Results for orders placed or performed during the hospital encounter of 07/04/20 (from the past 24 hour(s))  Urinalysis, Routine w reflex microscopic Urine, Clean Catch     Status: None   Collection Time: 07/04/20  7:50 PM  Result Value Ref Range   Color, Urine YELLOW YELLOW   APPearance CLEAR CLEAR   Specific Gravity, Urine 1.006 1.005 - 1.030   pH 6.0 5.0 - 8.0   Glucose, UA NEGATIVE NEGATIVE mg/dL   Hgb urine dipstick NEGATIVE NEGATIVE   Bilirubin Urine NEGATIVE NEGATIVE   Ketones, ur NEGATIVE NEGATIVE mg/dL   Protein, ur NEGATIVE NEGATIVE mg/dL   Nitrite NEGATIVE NEGATIVE   Leukocytes,Ua NEGATIVE NEGATIVE  Protein / creatinine ratio, urine     Status: None   Collection Time: 07/04/20  7:50 PM  Result Value Ref Range   Creatinine, Urine 43.79 mg/dL   Total Protein, Urine <6 mg/dL   Protein Creatinine Ratio        0.00 - 0.15 mg/mg[Cre]  CBC     Status: Abnormal   Collection Time: 07/04/20  8:07 PM  Result Value Ref Range   WBC 11.9 (H) 4.0 - 10.5 K/uL   RBC 4.54 3.87 - 5.11 MIL/uL   Hemoglobin 12.7 12.0 - 15.0 g/dL   HCT 38.0 36.0 - 46.0 %   MCV 83.7 80.0 - 100.0 fL   MCH 28.0 26.0 - 34.0 pg   MCHC 33.4 30.0 - 36.0 g/dL   RDW 13.7 11.5 - 15.5 %   Platelets 176 150 - 400 K/uL   nRBC 0.0 0.0 - 0.2 %  Comprehensive metabolic panel  Status: Abnormal   Collection Time:  07/04/20  8:07 PM  Result Value Ref Range   Sodium 136 135 - 145 mmol/L   Potassium 4.2 3.5 - 5.1 mmol/L   Chloride 104 98 - 111 mmol/L   CO2 23 22 - 32 mmol/L   Glucose, Bld 91 70 - 99 mg/dL   BUN 11 6 - 20 mg/dL   Creatinine, Ser 1.22 (H) 0.44 - 1.00 mg/dL   Calcium 9.1 8.9 - 10.3 mg/dL   Total Protein 6.1 (L) 6.5 - 8.1 g/dL   Albumin 2.9 (L) 3.5 - 5.0 g/dL   AST 17 15 - 41 U/L   ALT 19 0 - 44 U/L   Alkaline Phosphatase 77 38 - 126 U/L   Total Bilirubin 0.5 0.3 - 1.2 mg/dL   GFR, Estimated 59 (L) >60 mL/min   Anion gap 9 5 - 15   No results found.  MDM Physical Exam Labs: CBC, CMP, PC Ratio Measure BPQ15 min Pain Management EFM  Assessment and Plan  36 year old G2P1001  SIUP at 35.1weeks Cat I FT CHTN Headache  -Labs ordered and collected. -POC Reviewed. -Patient offered and accepts pain medication. -Will give fioricet and reassess. -Will also order scheduled dose of labetalol. -Exam performed. -NST Reactive -Will await results.   Maryann Conners MSN, CNM 07/04/2020, 8:11 PM   Reassessment (9:13 PM)  -Results return as above.  -Creatinine elevated.  Dr. Toma Deiters consulted and informed of patient status, evaluation, and results.  Advised: *Call primary ob and recommend admit for 24 hour observation with BMZ dosing, 24 hour urine, and serial labs.  -Dr. Lawson Fiscal called and informed of patient presentation, results, and interventions as well as recommendation. Agrees with recommendation and will place admission orders.  -Patient informed of POC and questions/concerns addressed. -Nurse informed of POC. -Care relinquished to [facility].  Maryann Conners MSN, CNM Advanced Practice Provider, [facility]

## 2020-07-05 ENCOUNTER — Observation Stay (HOSPITAL_BASED_OUTPATIENT_CLINIC_OR_DEPARTMENT_OTHER): Payer: BC Managed Care – PPO

## 2020-07-05 ENCOUNTER — Inpatient Hospital Stay (HOSPITAL_COMMUNITY): Payer: BC Managed Care – PPO | Admitting: Anesthesiology

## 2020-07-05 ENCOUNTER — Encounter (HOSPITAL_COMMUNITY)
Admission: AD | Disposition: A | Discharge status: Home / Self Care | Payer: Self-pay | Source: Home / Self Care | Attending: Obstetrics and Gynecology

## 2020-07-05 ENCOUNTER — Encounter (HOSPITAL_COMMUNITY): Payer: Self-pay | Admitting: Obstetrics and Gynecology

## 2020-07-05 DIAGNOSIS — Z3A35 35 weeks gestation of pregnancy: Secondary | ICD-10-CM | POA: Diagnosis not present

## 2020-07-05 DIAGNOSIS — O99213 Obesity complicating pregnancy, third trimester: Secondary | ICD-10-CM

## 2020-07-05 DIAGNOSIS — O99344 Other mental disorders complicating childbirth: Secondary | ICD-10-CM | POA: Diagnosis present

## 2020-07-05 DIAGNOSIS — Z20822 Contact with and (suspected) exposure to covid-19: Secondary | ICD-10-CM | POA: Diagnosis present

## 2020-07-05 DIAGNOSIS — Z87891 Personal history of nicotine dependence: Secondary | ICD-10-CM | POA: Diagnosis not present

## 2020-07-05 DIAGNOSIS — E669 Obesity, unspecified: Secondary | ICD-10-CM

## 2020-07-05 DIAGNOSIS — O09523 Supervision of elderly multigravida, third trimester: Secondary | ICD-10-CM | POA: Diagnosis not present

## 2020-07-05 DIAGNOSIS — O1002 Pre-existing essential hypertension complicating childbirth: Secondary | ICD-10-CM | POA: Diagnosis present

## 2020-07-05 DIAGNOSIS — F419 Anxiety disorder, unspecified: Secondary | ICD-10-CM | POA: Diagnosis present

## 2020-07-05 DIAGNOSIS — O3663X0 Maternal care for excessive fetal growth, third trimester, not applicable or unspecified: Secondary | ICD-10-CM

## 2020-07-05 DIAGNOSIS — O10013 Pre-existing essential hypertension complicating pregnancy, third trimester: Secondary | ICD-10-CM

## 2020-07-05 LAB — COMPREHENSIVE METABOLIC PANEL
ALT: 19 U/L (ref 0–44)
AST: 19 U/L (ref 15–41)
Albumin: 2.8 g/dL — ABNORMAL LOW (ref 3.5–5.0)
Alkaline Phosphatase: 74 U/L (ref 38–126)
Anion gap: 9 (ref 5–15)
BUN: 10 mg/dL (ref 6–20)
CO2: 23 mmol/L (ref 22–32)
Calcium: 9.3 mg/dL (ref 8.9–10.3)
Chloride: 104 mmol/L (ref 98–111)
Creatinine, Ser: 1.17 mg/dL — ABNORMAL HIGH (ref 0.44–1.00)
GFR, Estimated: >60 mL/min (ref >60)
Glucose, Bld: 137 mg/dL — ABNORMAL HIGH (ref 70–99)
Potassium: 4.2 mmol/L (ref 3.5–5.1)
Sodium: 136 mmol/L (ref 135–145)
Total Bilirubin: 0.6 mg/dL (ref 0.3–1.2)
Total Protein: 6 g/dL — ABNORMAL LOW (ref 6.5–8.1)

## 2020-07-05 LAB — CBC WITH DIFFERENTIAL/PLATELET
Abs Immature Granulocytes: 0.08 10*3/uL — ABNORMAL HIGH (ref 0.00–0.07)
Basophils Absolute: 0 10*3/uL (ref 0.0–0.1)
Basophils Relative: 0 %
Eosinophils Absolute: 0 10*3/uL (ref 0.0–0.5)
Eosinophils Relative: 0 %
HCT: 37.8 % (ref 36.0–46.0)
Hemoglobin: 12.3 g/dL (ref 12.0–15.0)
Immature Granulocytes: 1 %
Lymphocytes Relative: 12 %
Lymphs Abs: 1.2 10*3/uL (ref 0.7–4.0)
MCH: 27.7 pg (ref 26.0–34.0)
MCHC: 32.5 g/dL (ref 30.0–36.0)
MCV: 85.1 fL (ref 80.0–100.0)
Monocytes Absolute: 0.2 10*3/uL (ref 0.1–1.0)
Monocytes Relative: 2 %
Neutro Abs: 8.7 10*3/uL — ABNORMAL HIGH (ref 1.7–7.7)
Neutrophils Relative %: 85 %
Platelets: 159 10*3/uL (ref 150–400)
RBC: 4.44 MIL/uL (ref 3.87–5.11)
RDW: 13.6 % (ref 11.5–15.5)
WBC: 10.1 10*3/uL (ref 4.0–10.5)
nRBC: 0 % (ref 0.0–0.2)

## 2020-07-05 LAB — CBC
HCT: 37.9 % (ref 36.0–46.0)
Hemoglobin: 12.6 g/dL (ref 12.0–15.0)
MCH: 27.8 pg (ref 26.0–34.0)
MCHC: 33.2 g/dL (ref 30.0–36.0)
MCV: 83.5 fL (ref 80.0–100.0)
Platelets: 164 10*3/uL (ref 150–400)
RBC: 4.54 MIL/uL (ref 3.87–5.11)
RDW: 13.6 % (ref 11.5–15.5)
WBC: 11.2 10*3/uL — ABNORMAL HIGH (ref 4.0–10.5)
nRBC: 0 % (ref 0.0–0.2)

## 2020-07-05 SURGERY — Surgical Case
Anesthesia: General

## 2020-07-05 MED ORDER — HYDROMORPHONE HCL 1 MG/ML IJ SOLN
INTRAMUSCULAR | Status: AC
  Filled 2020-07-05: qty 0.5

## 2020-07-05 MED ORDER — PRENATAL MULTIVITAMIN CH
1.0000 | ORAL_TABLET | Freq: Every day | ORAL | Status: DC
  Administered 2020-07-06 – 2020-07-07 (×2): 1 via ORAL
  Filled 2020-07-05 (×2): qty 1

## 2020-07-05 MED ORDER — LACTATED RINGERS IV SOLN: INTRAVENOUS | Status: DC | PRN

## 2020-07-05 MED ORDER — SCOPOLAMINE 1 MG/3DAYS TD PT72
MEDICATED_PATCH | TRANSDERMAL | Status: AC
  Filled 2020-07-05: qty 1

## 2020-07-05 MED ORDER — ONDANSETRON HCL 4 MG/2ML IJ SOLN
INTRAMUSCULAR | Status: DC | PRN
  Administered 2020-07-05: 4 mg via INTRAVENOUS

## 2020-07-05 MED ORDER — MIDAZOLAM HCL 2 MG/2ML IJ SOLN
INTRAMUSCULAR | Status: AC
  Filled 2020-07-05: qty 2

## 2020-07-05 MED ORDER — LABETALOL HCL 200 MG PO TABS
200.0000 mg | ORAL_TABLET | Freq: Two times a day (BID) | ORAL | Status: DC
  Administered 2020-07-05 – 2020-07-06 (×3): 200 mg via ORAL
  Filled 2020-07-05 (×4): qty 1

## 2020-07-05 MED ORDER — ONDANSETRON HCL 4 MG/2ML IJ SOLN: 4.0000 mg | Freq: Once | INTRAMUSCULAR | Status: DC | PRN

## 2020-07-05 MED ORDER — ROCURONIUM BROMIDE 100 MG/10ML IV SOLN
INTRAVENOUS | Status: DC | PRN
  Administered 2020-07-05: 30 mg via INTRAVENOUS

## 2020-07-05 MED ORDER — IBUPROFEN 600 MG PO TABS
600.0000 mg | ORAL_TABLET | Freq: Four times a day (QID) | ORAL | Status: DC | PRN
  Administered 2020-07-05 – 2020-07-08 (×7): 600 mg via ORAL
  Filled 2020-07-05 (×7): qty 1

## 2020-07-05 MED ORDER — HYDROMORPHONE HCL 1 MG/ML IJ SOLN: 0.2000 mg | INTRAMUSCULAR | Status: DC | PRN

## 2020-07-05 MED ORDER — ZOLPIDEM TARTRATE 5 MG PO TABS: 5.0000 mg | ORAL_TABLET | Freq: Every evening | ORAL | Status: DC | PRN

## 2020-07-05 MED ORDER — SIMETHICONE 80 MG PO CHEW
80.0000 mg | CHEWABLE_TABLET | ORAL | Status: DC | PRN
Start: 2020-07-05 — End: 2020-07-08
  Administered 2020-07-05 – 2020-07-06 (×2): 80 mg via ORAL
  Filled 2020-07-05 (×2): qty 1

## 2020-07-05 MED ORDER — SODIUM CHLORIDE 0.9% FLUSH
3.0000 mL | Freq: Two times a day (BID) | INTRAVENOUS | Status: DC
  Administered 2020-07-05 (×2): 3 mL via INTRAVENOUS

## 2020-07-05 MED ORDER — COCONUT OIL OIL: 1.0000 "application " | TOPICAL_OIL | Status: DC | PRN

## 2020-07-05 MED ORDER — TETANUS-DIPHTH-ACELL PERTUSSIS 5-2.5-18.5 LF-MCG/0.5 IM SUSY: 0.5000 mL | PREFILLED_SYRINGE | Freq: Once | INTRAMUSCULAR | Status: DC

## 2020-07-05 MED ORDER — KETOROLAC TROMETHAMINE 30 MG/ML IJ SOLN
INTRAMUSCULAR | Status: AC
  Filled 2020-07-05: qty 1

## 2020-07-05 MED ORDER — CALCIUM CARBONATE ANTACID 500 MG PO CHEW
2.0000 | CHEWABLE_TABLET | ORAL | Status: DC | PRN
  Administered 2020-07-06 – 2020-07-07 (×2): 400 mg via ORAL
  Filled 2020-07-05 (×3): qty 2

## 2020-07-05 MED ORDER — OXYCODONE HCL 5 MG/5ML PO SOLN: 5.0000 mg | Freq: Once | ORAL | Status: DC | PRN

## 2020-07-05 MED ORDER — BUSPIRONE HCL 5 MG PO TABS
7.5000 mg | ORAL_TABLET | Freq: Two times a day (BID) | ORAL | Status: DC
  Administered 2020-07-05 (×2): 7.5 mg via ORAL
  Filled 2020-07-05 (×2): qty 1

## 2020-07-05 MED ORDER — FENTANYL CITRATE (PF) 100 MCG/2ML IJ SOLN
INTRAMUSCULAR | Status: AC
  Filled 2020-07-05: qty 2

## 2020-07-05 MED ORDER — DEXAMETHASONE SODIUM PHOSPHATE 10 MG/ML IJ SOLN
INTRAMUSCULAR | Status: DC | PRN
  Administered 2020-07-05: 10 mg via INTRAVENOUS

## 2020-07-05 MED ORDER — MIDAZOLAM HCL 2 MG/2ML IJ SOLN
INTRAMUSCULAR | Status: DC | PRN
  Administered 2020-07-05: 2 mg via INTRAVENOUS

## 2020-07-05 MED ORDER — LABETALOL HCL 200 MG PO TABS
200.0000 mg | ORAL_TABLET | Freq: Two times a day (BID) | ORAL | Status: DC
  Administered 2020-07-05: 200 mg via ORAL
  Filled 2020-07-05: qty 1

## 2020-07-05 MED ORDER — DIBUCAINE (PERIANAL) 1 % EX OINT: 1.0000 "application " | TOPICAL_OINTMENT | CUTANEOUS | Status: DC | PRN

## 2020-07-05 MED ORDER — MENTHOL 3 MG MT LOZG
1.0000 | LOZENGE | OROMUCOSAL | Status: DC | PRN
  Administered 2020-07-06: 3 mg via ORAL
  Filled 2020-07-05: qty 9

## 2020-07-05 MED ORDER — SUGAMMADEX SODIUM 200 MG/2ML IV SOLN
INTRAVENOUS | Status: DC | PRN
  Administered 2020-07-05: 300 mg via INTRAVENOUS

## 2020-07-05 MED ORDER — KETOROLAC TROMETHAMINE 30 MG/ML IJ SOLN
INTRAMUSCULAR | Status: DC | PRN
  Administered 2020-07-05: 30 mg via INTRAVENOUS

## 2020-07-05 MED ORDER — DIPHENHYDRAMINE HCL 25 MG PO CAPS: 25.0000 mg | ORAL_CAPSULE | Freq: Four times a day (QID) | ORAL | Status: DC | PRN

## 2020-07-05 MED ORDER — ACETAMINOPHEN 500 MG PO TABS
1000.0000 mg | ORAL_TABLET | Freq: Four times a day (QID) | ORAL | Status: DC
  Administered 2020-07-05 – 2020-07-08 (×11): 1000 mg via ORAL
  Filled 2020-07-05 (×11): qty 2

## 2020-07-05 MED ORDER — HYDROMORPHONE HCL 1 MG/ML IJ SOLN
0.2500 mg | INTRAMUSCULAR | Status: DC | PRN
  Administered 2020-07-05 (×2): 0.5 mg via INTRAVENOUS

## 2020-07-05 MED ORDER — DEXTROSE 5 % IV SOLN
INTRAVENOUS | Status: DC | PRN
  Administered 2020-07-05: 3 g via INTRAVENOUS

## 2020-07-05 MED ORDER — WITCH HAZEL-GLYCERIN EX PADS: 1.0000 "application " | MEDICATED_PAD | CUTANEOUS | Status: DC | PRN

## 2020-07-05 MED ORDER — PROPOFOL 10 MG/ML IV BOLUS
INTRAVENOUS | Status: DC | PRN
  Administered 2020-07-05: 200 mg via INTRAVENOUS

## 2020-07-05 MED ORDER — OXYTOCIN-SODIUM CHLORIDE 30-0.9 UT/500ML-% IV SOLN: 2.5000 [IU]/h | INTRAVENOUS | Status: AC

## 2020-07-05 MED ORDER — SENNOSIDES-DOCUSATE SODIUM 8.6-50 MG PO TABS
2.0000 | ORAL_TABLET | Freq: Every day | ORAL | Status: DC
  Administered 2020-07-06 – 2020-07-08 (×3): 2 via ORAL
  Filled 2020-07-05 (×3): qty 2

## 2020-07-05 MED ORDER — LACTATED RINGERS IV SOLN: INTRAVENOUS | Status: DC

## 2020-07-05 MED ORDER — FENTANYL CITRATE (PF) 100 MCG/2ML IJ SOLN
INTRAMUSCULAR | Status: DC | PRN
  Administered 2020-07-05: 150 ug via INTRAVENOUS
  Administered 2020-07-05: 100 ug via INTRAVENOUS
  Administered 2020-07-05 (×3): 50 ug via INTRAVENOUS

## 2020-07-05 MED ORDER — ONDANSETRON HCL 4 MG/2ML IJ SOLN
INTRAMUSCULAR | Status: AC
  Filled 2020-07-05: qty 2

## 2020-07-05 MED ORDER — OXYCODONE HCL 5 MG PO TABS: 5.0000 mg | ORAL_TABLET | Freq: Once | ORAL | Status: DC | PRN

## 2020-07-05 MED ORDER — BUSPIRONE HCL 5 MG PO TABS
7.5000 mg | ORAL_TABLET | Freq: Two times a day (BID) | ORAL | Status: DC | PRN
  Administered 2020-07-05: 7.5 mg via ORAL
  Filled 2020-07-05: qty 2

## 2020-07-05 MED ORDER — DEXAMETHASONE SODIUM PHOSPHATE 10 MG/ML IJ SOLN
INTRAMUSCULAR | Status: AC
  Filled 2020-07-05: qty 1

## 2020-07-05 MED ORDER — FENTANYL CITRATE (PF) 250 MCG/5ML IJ SOLN
INTRAMUSCULAR | Status: AC
  Filled 2020-07-05: qty 5

## 2020-07-05 MED ORDER — LABETALOL HCL 100 MG PO TABS
100.0000 mg | ORAL_TABLET | Freq: Two times a day (BID) | ORAL | Status: DC
  Administered 2020-07-05: 100 mg via ORAL
  Filled 2020-07-05: qty 1

## 2020-07-05 MED ORDER — KETOROLAC TROMETHAMINE 30 MG/ML IJ SOLN: 30.0000 mg | Freq: Once | INTRAMUSCULAR | Status: DC | PRN

## 2020-07-05 MED ORDER — OXYTOCIN-SODIUM CHLORIDE 30-0.9 UT/500ML-% IV SOLN
INTRAVENOUS | Status: DC | PRN
  Administered 2020-07-05: 30 [IU] via INTRAVENOUS

## 2020-07-05 MED ORDER — OXYCODONE HCL 5 MG PO TABS: 5.0000 mg | ORAL_TABLET | ORAL | Status: DC | PRN

## 2020-07-05 MED ORDER — ACETAMINOPHEN 10 MG/ML IV SOLN
INTRAVENOUS | Status: DC | PRN
  Administered 2020-07-05: 1000 mg via INTRAVENOUS

## 2020-07-05 MED ORDER — SODIUM CHLORIDE 0.9 % IV SOLN: INTRAVENOUS | Status: DC | PRN

## 2020-07-05 MED ORDER — LACTATED RINGERS IV BOLUS
500.0000 mL | Freq: Once | INTRAVENOUS | Status: AC
  Administered 2020-07-05: 500 mL via INTRAVENOUS

## 2020-07-05 MED ORDER — SUCCINYLCHOLINE CHLORIDE 20 MG/ML IJ SOLN
INTRAMUSCULAR | Status: DC | PRN
  Administered 2020-07-05: 140 mg via INTRAVENOUS

## 2020-07-05 MED ORDER — SIMETHICONE 80 MG PO CHEW
80.0000 mg | CHEWABLE_TABLET | Freq: Three times a day (TID) | ORAL | Status: DC
  Administered 2020-07-05 – 2020-07-08 (×7): 80 mg via ORAL
  Filled 2020-07-05 (×7): qty 1

## 2020-07-05 MED ORDER — DEXTROSE 5 % IV SOLN
INTRAVENOUS | Status: AC
  Filled 2020-07-05: qty 3000

## 2020-07-05 MED ORDER — SOD CITRATE-CITRIC ACID 500-334 MG/5ML PO SOLN
ORAL | Status: AC
  Filled 2020-07-05: qty 15

## 2020-07-05 SURGICAL SUPPLY — 38 items
ADH SKN CLS APL DERMABOND .7 (GAUZE/BANDAGES/DRESSINGS)
APL SKNCLS STERI-STRIP NONHPOA (GAUZE/BANDAGES/DRESSINGS) ×1
BENZOIN TINCTURE PRP APPL 2/3 (GAUZE/BANDAGES/DRESSINGS) ×1 IMPLANT
CHLORAPREP W/TINT 26ML (MISCELLANEOUS) ×2 IMPLANT
CLAMP CORD UMBIL (MISCELLANEOUS) IMPLANT
CLOSURE STERI STRIP 1/2 X4 (GAUZE/BANDAGES/DRESSINGS) ×1 IMPLANT
CLOTH BEACON ORANGE TIMEOUT ST (SAFETY) ×2 IMPLANT
DERMABOND ADVANCED (GAUZE/BANDAGES/DRESSINGS)
DERMABOND ADVANCED .7 DNX12 (GAUZE/BANDAGES/DRESSINGS) IMPLANT
DRSG OPSITE POSTOP 4X10 (GAUZE/BANDAGES/DRESSINGS) ×2 IMPLANT
ELECT REM PT RETURN 9FT ADLT (ELECTROSURGICAL) ×2
ELECTRODE REM PT RTRN 9FT ADLT (ELECTROSURGICAL) ×1 IMPLANT
EXTRACTOR VACUUM M CUP 4 TUBE (SUCTIONS) IMPLANT
GLOVE BIO SURGEON STRL SZ7.5 (GLOVE) ×2 IMPLANT
GLOVE BIOGEL PI IND STRL 7.0 (GLOVE) ×1 IMPLANT
GLOVE BIOGEL PI INDICATOR 7.0 (GLOVE) ×1
GOWN STRL REUS W/TWL LRG LVL3 (GOWN DISPOSABLE) ×4 IMPLANT
KIT ABG SYR 3ML LUER SLIP (SYRINGE) ×2 IMPLANT
NDL HYPO 25X5/8 SAFETYGLIDE (NEEDLE) ×1 IMPLANT
NEEDLE HYPO 25X5/8 SAFETYGLIDE (NEEDLE) ×2 IMPLANT
NS IRRIG 1000ML POUR BTL (IV SOLUTION) ×2 IMPLANT
PACK C SECTION WH (CUSTOM PROCEDURE TRAY) ×2 IMPLANT
PAD ABD DERMACEA PRESS 5X9 (GAUZE/BANDAGES/DRESSINGS) ×1 IMPLANT
PAD OB MATERNITY 4.3X12.25 (PERSONAL CARE ITEMS) ×2 IMPLANT
PENCIL SMOKE EVAC W/HOLSTER (ELECTROSURGICAL) ×2 IMPLANT
RETAINER VISCERAL (MISCELLANEOUS) ×1 IMPLANT
STRIP CLOSURE SKIN 1/2X4 (GAUZE/BANDAGES/DRESSINGS) IMPLANT
SUT MNCRL 0 VIOLET CTX 36 (SUTURE) ×4 IMPLANT
SUT MONOCRYL 0 CTX 36 (SUTURE) ×8
SUT PDS AB 0 CTX 60 (SUTURE) ×2 IMPLANT
SUT PLAIN 0 NONE (SUTURE) IMPLANT
SUT PLAIN 2 0 (SUTURE) ×2
SUT PLAIN 2 0 XLH (SUTURE) IMPLANT
SUT PLAIN ABS 2-0 CT1 27XMFL (SUTURE) IMPLANT
SUT VIC AB 4-0 KS 27 (SUTURE) ×2 IMPLANT
TOWEL OR 17X24 6PK STRL BLUE (TOWEL DISPOSABLE) ×2 IMPLANT
TRAY FOLEY W/BAG SLVR 14FR LF (SET/KITS/TRAYS/PACK) ×2 IMPLANT
WATER STERILE IRR 1000ML POUR (IV SOLUTION) ×2 IMPLANT

## 2020-07-05 NOTE — Progress Notes (Signed)
RN attempted to remove TUMS for patient from pyxis and pyxis only had 500mg  TUMS available. RN called to speak with pharmaicist to see if 400mg  TUMS could be sent to floor and pharmacist informed RN that the 500mg  TUMS is what the patient is supposed to be getting. The order reads: 400mg  of elemental calcium; Calcium carbonate 500mg =200 mg 1 tablet. 2 tablets (2x 200mg  of elemental calcium tablet) q4h PRN. RN verified with pharmacist again that it was correct to give patient two tablets and pharmacist agreed.

## 2020-07-05 NOTE — Brief Op Note (Signed)
07/05/2020  11:51 AM  PATIENT:  Nancy Rivera  35 y.o. female  PRE-OPERATIVE DIAGNOSIS:  STAT C/S for fetal HR  POST-OPERATIVE DIAGNOSIS:  STAT C/S for fetal HR  PROCEDURE:  Procedure(s): CESAREAN SECTION (N/A)  SURGEON:  Surgeon(s) and Role:    * Everlene Farrier, MD - Primary  PHYSICIAN ASSISTANT:   ASSISTANTS: none   ANESTHESIA:   general  EBL:  468 mL   BLOOD ADMINISTERED:none  DRAINS: Urinary Catheter (Foley)   LOCAL MEDICATIONS USED:  NONE  SPECIMEN:  Source of Specimen:  placenta  DISPOSITION OF SPECIMEN:  PATHOLOGY  COUNTS:  YES  TOURNIQUET:  * No tourniquets in log *  DICTATION: .Other Dictation: Dictation Number  [id number]  PLAN OF CARE: Admit to inpatient   PATIENT DISPOSITION:  PACU - hemodynamically stable.   Delay start of Pharmacological VTE agent (>24hrs) due to surgical blood loss or risk of bleeding: not applicable

## 2020-07-05 NOTE — Transfer of Care (Signed)
Immediate Anesthesia Transfer of Care Note  Patient: Nancy Rivera  Procedure(s) Performed: CESAREAN SECTION (N/A )  Patient Location: PACU  Anesthesia Type:General  Level of Consciousness: awake, alert  and oriented  Airway & Oxygen Therapy: Patient Spontanous Breathing and Patient connected to nasal cannula oxygen  Post-op Assessment: Report given to RN and Post -op Vital signs reviewed and stable  Post vital signs: Reviewed and stable  Last Vitals:  Vitals Value Taken Time  BP 133/76 07/05/20 1201  Temp 36.7 C 07/05/20 1201  Pulse 77 07/05/20 1212  Resp 16 07/05/20 1212  SpO2 100 % 07/05/20 1212  Vitals shown include unvalidated device data.  Last Pain:  Vitals:   07/05/20 1201  TempSrc:   PainSc: 8          Complications: No complications documented.

## 2020-07-05 NOTE — Lactation Note (Signed)
This note was copied from a baby's chart. Lactation Consultation Note  Patient Name: Boy Shamiyah Ngu [id number]'O Date: 07/05/2020 Reason for consult: Initial assessment;Mother's request;NICU baby;Late-preterm 34-36.6wks;Maternal endocrine disorder Age:37 hours   Mom states infant doing well. LC provided NICU brochure and  reviewed [facility] brochure of inpatient and outpatient services.  [place] set Mom up on DEBP with pumping frequency q 3 hrs for 15 minutes.  Mom will contact RN to label and transport any EBM to NICU.  Mom selected breast and formula on admission. [place] reviewed with Mom that [facility] also option reviewing benefits.   Mom will pump and offer her EBM for now.  All questions answered at the end of the visit.   Maternal Data Has patient been taught Hand Expression?: Yes  Feeding Mother's Current Feeding Choice: Breast Milk and Formula  LATCH Score                    Lactation Tools Discussed/Used Tools: Pump;Flanges Flange Size: 27;24 (27 flange on left, 24 flange on the right) Breast pump type: Double-Electric Breast Pump Pump Education: Setup, frequency, and cleaning;Milk Storage Reason for Pumping: Increase stimulation Pumping frequency: every 3 hrs for 15 minutes  Interventions Interventions: Breast feeding basics reviewed;Breast compression;Skin to skin;DEBP;Breast massage;Position options;Hand express;Expressed milk;Education  Discharge Pump: Personal WIC Program: No  Consult Status Consult Status: Follow-up Date: 07/06/20 Follow-up type: In-patient    Graviel Payeur  Nicholson-Springer 07/05/2020, 5:20 PM

## 2020-07-05 NOTE — Anesthesia Procedure Notes (Signed)
Procedure Name: Intubation Date/Time: 07/05/2020 11:10 AM Performed by: Genevie Ann, CRNA Pre-anesthesia Checklist: Patient identified, Emergency Drugs available, Suction available, Patient being monitored and Timeout performed Patient Re-evaluated:Patient Re-evaluated prior to induction Oxygen Delivery Method: Circle system utilized Preoxygenation: Pre-oxygenation with 100% oxygen Induction Type: IV induction and Rapid sequence Laryngoscope Size: Glidescope and 3 Grade View: Grade I Tube size: 7.0 mm Number of attempts: 1 Airway Equipment and Method: Video-laryngoscopy

## 2020-07-05 NOTE — Anesthesia Preprocedure Evaluation (Signed)
Anesthesia Evaluation  Patient identified by MRN, date of birth, ID band Patient awake    Reviewed: NPO status Preop documentation limited or incomplete due to emergent nature of procedure.  Airway Mallampati: II  TM Distance: >3 FB Neck ROM: Full    Dental no notable dental hx. (+) Teeth Intact   Pulmonary former smoker,    Pulmonary exam normal breath sounds clear to auscultation       Cardiovascular hypertension, Pt. on medications and Pt. on home beta blockers Normal cardiovascular exam Rhythm:Regular Rate:Normal     Neuro/Psych    GI/Hepatic negative GI ROS, Neg liver ROS,   Endo/Other  negative endocrine ROS  Renal/GU negative Renal ROS     Musculoskeletal   Abdominal   Peds  Hematology Lab Results      Component                Value               Date                      WBC                      11.2 (H)            07/05/2020                HGB                      12.6                07/05/2020                HCT                      37.9                07/05/2020                MCV                      83.5                07/05/2020                PLT                      164                 07/05/2020              Anesthesia Other Findings   Reproductive/Obstetrics (+) Pregnancy                             Anesthesia Physical Anesthesia Plan  ASA: III and emergent  Anesthesia Plan: General   Post-op Pain Management:    Induction: Intravenous  PONV Risk Score and Plan: 4 or greater and Treatment may vary due to age or medical condition, Ondansetron, Midazolam and Dexamethasone  Airway Management Planned: Oral ETT  Additional Equipment: None  Intra-op Plan:   Post-operative Plan: Extubation in OR  Informed Consent: I have reviewed the patients History and Physical, chart, labs and discussed the procedure including the risks, benefits and alternatives for the  proposed anesthesia with the patient or authorized representative who has indicated his/her understanding and  acceptance.     Dental advisory given  Plan Discussed with: CRNA and Anesthesiologist  Anesthesia Plan Comments:         Anesthesia Quick Evaluation

## 2020-07-05 NOTE — Op Note (Signed)
Nancy Rivera, Nancy Rivera MEDICAL RECORD NO: [id number] ACCOUNT NO: [id number] DATE OF BIRTH: [birth year] FACILITY: [facility] LOCATION: [facility] PHYSICIAN: Daleen Bo. Lyn Hollingshead, MD  Operative Report   DATE OF PROCEDURE: 07/05/2020  PREOPERATIVE DIAGNOSES: 1.  Intrauterine pregnancy at 35-2/7th weeks. 2.  Chronic hypertension. 3.  Nonreassuring fetal heart tracing.  POSTOPERATIVE DIAGNOSES: 1.  Intrauterine pregnancy at 35-2/7th weeks. 2.  Chronic hypertension. 3.  Nonreassuring fetal heart tracing.  PROCEDURE:  Emergent low transverse cesarean section.  ASSISTANT:  Daleen Bo. Lyn Hollingshead, MD  ANESTHESIA:  General with endotracheal intubation.  ESTIMATED BLOOD LOSS:  Per anesthesia note.  FINDINGS:  Viable female infant, Apgars, arterial cord pH, birth weight pending.  INDICATIONS AND CONSENT:  This patient is a 35 year old G2 P1 at 85 and 2/7 weeks.  She presents to the hospital last evening with complaints of headache and elevated blood pressure.  She has chronic hypertension, on labetalol 200 mg b.i.d.  Last evening  blood pressures were not in the severe range.  Laboratories were normal with the exception of a creatinine of 1.2.  She was held in the hospital for observation overnight.  She also had headache on presentation.  This morning headache resolved and she  felt well.  Labs were normal except for again an elevated creatinine of 1.17.  Nonstress test last evening was category 1.  She received betamethasone one shot last evening as well.  She was taken to ultrasound and verbal report was 6/8 with a notation  apparently of a heart rate at times below 120.  Although the written report is pending at the time of dictation.  When she returns to her room and was placed on the fetal monitor, baseline of approximately 110 is noted that wavers between 90s and 100s.   There were some accelerations, but there were also multiple decelerations noted as well.  Uterus was soft.  She had no pain.  There  was no bleeding.  Cervix is closed and thick and high to exam.  She is given IV fluid bolus and multiple position changes.   She continued to have fetal decelerations into the 70s to the 80s and delivery was recommended.  Cesarean section was discussed with the patient and her husband and potential risks and complications were reviewed preoperatively including but not  limited to infection, organ damage, bleeding requiring transfusion of blood products with HIV and hepatitis acquisition, DVT, PE, and pneumonia.  She states she understands and agrees.  Baby again had a deceleration into the 70s to the 80s.  An emergent  cesarean section was called.  DESCRIPTION OF PROCEDURE:  The patient was taken to the operating room where she is emergently placed on the operating room table.  Foley catheter was placed and she was prepped with Betadine and draped in a sterile fashion.  After securing general  anesthesia with endotracheal intubation an incision was made in a Pfannenstiel fashion.  Dissection was carried out in layers to the peritoneum, which is taken down bluntly.  Uterus was incised in a low transverse manner and the uterine cavity was  entered bluntly with a hemostat.  Clear fluid was noted.  Uterine incision was extended with fingers.  A mushroom vacuum extractor was then used to elevate the vertex through the incision and the remainder of the baby is delivered without difficulty.   There is no nuchal cord noted at the time of delivery.  Baby has excellent cry and tone at delivery.  After 1 minute, cord was  clamped and cut and the baby was handed to waiting pediatrics team.  Placenta is manually delivered and sent to pathology.   Uterus is vigorously massaged and involutes well with Pitocin.  The cavity is clean.  Uterus was closed in 2 running locking imbricating layers of 0 Monocryl suture.  Lavage was carried out and careful inspection reveals good hemostasis.  The anterior  peritoneum was closed in  a running fashion with 0 Monocryl suture, which was also used to reapproximate the pyramidalis muscle in the midline.  The anterior rectus fascia was closed in a running fashion with 0 looped PDS.  Subcutaneous layer was closed  with interrupted 2-0 plain and the skin was closed in a subcuticular fashion with 4-0 Vicryl on a Keith needle.  Benzoin, Steri-Strips, honeycomb and pressure dressing were applied.  All counts were correct and the patient was awakened and taken to the  recovery room in stable condition.   PUS D: 07/05/2020 11:58:28 am T: 07/05/2020 1:05:00 pm  JOB: [id number]/ [id number]

## 2020-07-05 NOTE — Anesthesia Postprocedure Evaluation (Signed)
Anesthesia Post Note  Patient: Nancy Rivera  Procedure(s) Performed: CESAREAN SECTION (N/A )     Patient location during evaluation: PACU Anesthesia Type: General Level of consciousness: awake and alert Pain management: pain level controlled Vital Signs Assessment: post-procedure vital signs reviewed and stable Respiratory status: spontaneous breathing, nonlabored ventilation, respiratory function stable and patient connected to nasal cannula oxygen Cardiovascular status: blood pressure returned to baseline and stable Postop Assessment: no apparent nausea or vomiting Anesthetic complications: no   No complications documented.  Last Vitals:  Vitals:   07/05/20 1258 07/05/20 1307  BP: 138/76 139/72  Pulse: 63 64  Resp: 15 16  Temp: 37 C 36.9 C  SpO2: 98% 99%    Last Pain:  Vitals:   07/05/20 1258  TempSrc:   PainSc: 4    Pain Goal:    LLE Motor Response: Purposeful movement (07/05/20 1245)   RLE Motor Response: Purposeful movement (07/05/20 1245)       Epidural/Spinal Function Cutaneous sensation: Normal sensation (07/05/20 1245), Patient able to flex knees: Yes (07/05/20 1245), Patient able to lift hips off bed: No (07/05/20 1245), Back pain beyond tenderness at insertion site: No (07/05/20 1245), Progressively worsening motor and/or sensory loss: No (07/05/20 1245), Bowel and/or bladder incontinence post epidural: No (07/05/20 1245)  Barnet Glasgow

## 2020-07-05 NOTE — Progress Notes (Signed)
Urgent section called at 1056; escalated to Code cesarean at 1100 am. Patient received sodium citrate.

## 2020-07-05 NOTE — Progress Notes (Signed)
Feels good HA gone  Today's Vitals   07/05/20 0155 07/05/20 0454 07/05/20 0500 07/05/20 0726  BP: 132/74 127/76  130/78  Pulse: 82 69  75  Resp:  20  19  Temp:  98 F (36.7 C)  98.2 F (36.8 C)  TempSrc:  Oral  Oral  SpO2: 98% 99%  100%  Weight:   (!) 146.5 kg   Height:      PainSc:  0-No pain     Body mass index is 47.7 kg/m.   NST reactive  Results for orders placed or performed during the hospital encounter of 07/04/20 (from the past 24 hour(s))  Urinalysis, Routine w reflex microscopic Urine, Clean Catch     Status: None   Collection Time: 07/04/20  7:50 PM  Result Value Ref Range   Color, Urine YELLOW YELLOW   APPearance CLEAR CLEAR   Specific Gravity, Urine 1.006 1.005 - 1.030   pH 6.0 5.0 - 8.0   Glucose, UA NEGATIVE NEGATIVE mg/dL   Hgb urine dipstick NEGATIVE NEGATIVE   Bilirubin Urine NEGATIVE NEGATIVE   Ketones, ur NEGATIVE NEGATIVE mg/dL   Protein, ur NEGATIVE NEGATIVE mg/dL   Nitrite NEGATIVE NEGATIVE   Leukocytes,Ua NEGATIVE NEGATIVE  Protein / creatinine ratio, urine     Status: None   Collection Time: 07/04/20  7:50 PM  Result Value Ref Range   Creatinine, Urine 43.79 mg/dL   Total Protein, Urine <6 mg/dL   Protein Creatinine Ratio        0.00 - 0.15 mg/mg[Cre]  CBC     Status: Abnormal   Collection Time: 07/04/20  8:07 PM  Result Value Ref Range   WBC 11.9 (H) 4.0 - 10.5 K/uL   RBC 4.54 3.87 - 5.11 MIL/uL   Hemoglobin 12.7 12.0 - 15.0 g/dL   HCT 38.0 36.0 - 46.0 %   MCV 83.7 80.0 - 100.0 fL   MCH 28.0 26.0 - 34.0 pg   MCHC 33.4 30.0 - 36.0 g/dL   RDW 13.7 11.5 - 15.5 %   Platelets 176 150 - 400 K/uL   nRBC 0.0 0.0 - 0.2 %  Comprehensive metabolic panel     Status: Abnormal   Collection Time: 07/04/20  8:07 PM  Result Value Ref Range   Sodium 136 135 - 145 mmol/L   Potassium 4.2 3.5 - 5.1 mmol/L   Chloride 104 98 - 111 mmol/L   CO2 23 22 - 32 mmol/L   Glucose, Bld 91 70 - 99 mg/dL   BUN 11 6 - 20 mg/dL   Creatinine, Ser 1.22 (H) 0.44 -  1.00 mg/dL   Calcium 9.1 8.9 - 10.3 mg/dL   Total Protein 6.1 (L) 6.5 - 8.1 g/dL   Albumin 2.9 (L) 3.5 - 5.0 g/dL   AST 17 15 - 41 U/L   ALT 19 0 - 44 U/L   Alkaline Phosphatase 77 38 - 126 U/L   Total Bilirubin 0.5 0.3 - 1.2 mg/dL   GFR, Estimated 59 (L) >60 mL/min   Anion gap 9 5 - 15  Type and screen [place]     Status: None   Collection Time: 07/04/20 10:01 PM  Result Value Ref Range   ABO/RH(D) A POS    Antibody Screen NEG    Sample Expiration      07/07/2020,2359 Performed at [facility], [address], [place], [place] [address]   Resp Panel by RT-PCR (Flu A&B, Covid) Nasopharyngeal Swab  Status: None   Collection Time: 07/04/20 10:01 PM   Specimen: Nasopharyngeal Swab; Nasopharyngeal(NP) swabs in vial transport medium  Result Value Ref Range   SARS Coronavirus 2 by RT PCR NEGATIVE NEGATIVE   Influenza A by PCR NEGATIVE NEGATIVE   Influenza B by PCR NEGATIVE NEGATIVE  CBC on admission     Status: Abnormal   Collection Time: 07/05/20  4:12 AM  Result Value Ref Range   WBC 11.2 (H) 4.0 - 10.5 K/uL   RBC 4.54 3.87 - 5.11 MIL/uL   Hemoglobin 12.6 12.0 - 15.0 g/dL   HCT 37.9 36.0 - 46.0 %   MCV 83.5 80.0 - 100.0 fL   MCH 27.8 26.0 - 34.0 pg   MCHC 33.2 30.0 - 36.0 g/dL   RDW 13.6 11.5 - 15.5 %   Platelets 164 150 - 400 K/uL   nRBC 0.0 0.0 - 0.2 %  Comprehensive metabolic panel     Status: Abnormal   Collection Time: 07/05/20  4:12 AM  Result Value Ref Range   Sodium 136 135 - 145 mmol/L   Potassium 4.2 3.5 - 5.1 mmol/L   Chloride 104 98 - 111 mmol/L   CO2 23 22 - 32 mmol/L   Glucose, Bld 137 (H) 70 - 99 mg/dL   BUN 10 6 - 20 mg/dL   Creatinine, Ser 1.17 (H) 0.44 - 1.00 mg/dL   Calcium 9.3 8.9 - 10.3 mg/dL   Total Protein 6.0 (L) 6.5 - 8.1 g/dL   Albumin 2.8 (L) 3.5 - 5.0 g/dL   AST 19 15 - 41 U/L   ALT 19 0 - 44 U/L   Alkaline Phosphatase 74 38 - 126 U/L   Total Bilirubin 0.6 0.3 - 1.2 mg/dL   GFR, Estimated >60 >60 mL/min    Anion gap 9 5 - 15   A/P: 35 2/7 weeks         CHTN-states her creatinine has been elevated and followed by PCP for several years                  - labetalol 200mg  po BID         FWB-NST reactive                 - U/S for growth today                 - BMZ x1 done

## 2020-07-06 LAB — COMPREHENSIVE METABOLIC PANEL
ALT: 24 U/L (ref 0–44)
AST: 67 U/L — ABNORMAL HIGH (ref 15–41)
Albumin: 2.5 g/dL — ABNORMAL LOW (ref 3.5–5.0)
Alkaline Phosphatase: 57 U/L (ref 38–126)
Anion gap: 8 (ref 5–15)
BUN: 10 mg/dL (ref 6–20)
CO2: 21 mmol/L — ABNORMAL LOW (ref 22–32)
Calcium: 8.9 mg/dL (ref 8.9–10.3)
Chloride: 107 mmol/L (ref 98–111)
Creatinine, Ser: 1.16 mg/dL — ABNORMAL HIGH (ref 0.44–1.00)
GFR, Estimated: >60 mL/min (ref >60)
Glucose, Bld: 91 mg/dL (ref 70–99)
Potassium: 4 mmol/L (ref 3.5–5.1)
Sodium: 136 mmol/L (ref 135–145)
Total Bilirubin: 0.4 mg/dL (ref 0.3–1.2)
Total Protein: 4.9 g/dL — ABNORMAL LOW (ref 6.5–8.1)

## 2020-07-06 LAB — CBC
HCT: 30.9 % — ABNORMAL LOW (ref 36.0–46.0)
Hemoglobin: 10.4 g/dL — ABNORMAL LOW (ref 12.0–15.0)
MCH: 28.3 pg (ref 26.0–34.0)
MCHC: 33.7 g/dL (ref 30.0–36.0)
MCV: 84 fL (ref 80.0–100.0)
Platelets: 187 10*3/uL (ref 150–400)
RBC: 3.68 MIL/uL — ABNORMAL LOW (ref 3.87–5.11)
RDW: 13.7 % (ref 11.5–15.5)
WBC: 12.8 10*3/uL — ABNORMAL HIGH (ref 4.0–10.5)
nRBC: 0 % (ref 0.0–0.2)

## 2020-07-06 NOTE — Progress Notes (Addendum)
RN assessed patient for last 4 hour check and checked patients output. Patients output adequate and informed patient that foley could be removed at that time. Patient told RN that she would like to keep it in due to the fact that she was going to NICU and she was told that if she held the infant that she "had to hold him for an hour" per NICU nurse. Patient stated that she did not want to interrupt that time and wanted to keep foley in if possible.   RN spoke to NICU charge nurse and she stated that it would be okay if the patient had to use the bathroom while visiting in NICU. She stated that generally they do like for an hour of interrupted holding time, but the infant is on CPAP so it was okay if she did needed to place him back in isolette so that she could use the bathroom.   RN will relay information to patient and remove foley.

## 2020-07-06 NOTE — Progress Notes (Signed)
Pt notified RN that she was experiencing SOB, chest tightness and pain in between shoulder blades. RN assessed pt O2 sat (100%) and lung sounds clear. Pt is using incentive spirometer periodically and ambulating to visit infant in NICU. RN notified Dr. Mardelle Matte, given orders to administer TUMS first if not effective to call back. RN will continue to monitor and report of to oncoming RN.   Marguerita Beards, RN

## 2020-07-06 NOTE — Clinical Social Work Maternal (Addendum)
  CLINICAL SOCIAL WORK MATERNAL/CHILD NOTE  Patient Details  Name: Nancy Rivera MRN: [id number] Date of Birth: Jun 20, 1985  Date:  07/06/2020  Clinical Social Worker Initiating Note:  Darcus Austin, MSW, LCSWA Date/Time: Initiated:  07/06/20/0315     Child's Name:  Nancy Rivera   Biological Parents:  Mother,Father (FOB- Nancy Rivera)   Need for Interpreter:  None   Reason for Referral:  Behavioral Health Concerns (Anxiety)   Address:  [address] [place] [address]    Phone number:  [phone] (home)     Additional phone number: FOB [phone]  Household Members/Support Persons (HM/SP):   Household Member/Support Person 1,Household Member/Support Person 2   HM/SP Name Relationship DOB or Age  HM/SP -1 Nancy Rivera Spouse    HM/SP -2 Nancy Rivera Son 07/12/13  HM/SP -3        HM/SP -4        HM/SP -5        HM/SP -6        HM/SP -7        HM/SP -8          Natural Supports (not living in the home):  Extended [employer]   Professional Supports:     Employment: Animator (Veterinary surgeon)   Type of Work: Materials engineer   Education:  Forensic psychologist   Homebound arranged:    Museum/gallery curator Resources:  Multimedia programmer   Other Resources:      Cultural/Religious Considerations Which May Impact Care:  None reported  Strengths:  Home prepared for child ,Pediatrician chosen   Psychotropic Medications:         Pediatrician:    Careers adviser area  Pediatrician List:   Engineer, manufacturing)  [place]      Pediatrician Fax Number:    Risk Factors/Current Problems:  Mental Health Concerns    Cognitive State:  Alert ,Linear Thinking ,Insightful ,Able to Concentrate ,Goal Oriented    Mood/Affect:  Interested ,Comfortable ,Relaxed ,Bright ,Calm ,Happy    CSW Assessment: CSW met with MOB to complete  assessment for hx of anxiety. CWS explained role and reason for consult. CSW reported, 2014 hx anxiety and managing symptoms with Buspar. MOB identified FOB, her mom and family as her support. MOB was very engaged, polite and insightful.  MOB denied any SI, HI and DV.     MOB reported, medical team is good at giving updates. MOB did not identify any barriers to visiting infant.    CSW provided education on Sudden infant death syndrome (SIDS) and PPD.CSW provided MOB with Postpartum Depression Checklist and encouraged  MOB to monitor symptoms. CSW informed MOB about information on WIC/ FS and MOB declined. MOB expressed having all essentials needed to care for infant. MOB stated, the infant has a car seat, pack & play, and bassinet. MOB reported, [facility] will be infant's chosen pediatrian.       CSW Plan/Description:  Psychosocial Support and Ongoing Assessment of Needs,Sudden Infant Death Syndrome (SIDS) Education,Perinatal Mood and Anxiety Disorder (PMADs) Education,Other Information/Referral to [employer], [place] 07/06/2020, 4:15 PM

## 2020-07-06 NOTE — Progress Notes (Signed)
Postpartum Progress Note  Postpartum Day 1 s/p emergency Cesarean section (35w, NRFHT).  Subjective:  Patient reports no overnight events.  She reports well controlled pain, ambulating without difficulty, foley removed this AM and awaiting spontaneous void, tolerating PO.  She reports Positive flatus, Negative BM.  Vaginal bleeding is minimal.  Objective: Blood pressure (!) 154/80, pulse 79, temperature 98 F (36.7 C), temperature source Oral, resp. rate 17, height 5\' 9"  (1.753 m), weight (!) 146.5 kg, last menstrual period 11/01/2019, SpO2 97 %, unknown if currently breastfeeding.  Physical Exam:  General: alert and no distress Lochia: appropriate Uterine Fundus: firm Incision: dressing in place DVT Evaluation: No evidence of DVT seen on physical exam.  Recent Labs    07/05/20 1047 07/06/20 0528  HGB 12.3 10.4*  HCT 37.8 30.9*    Assessment/Plan: . Postpartum Day 1, s/p C-section . Baby in NICU, patient has been visiting frequently . Foley removed this AM, awaiting void . Lactation following . Creat with slight improvement 1.22 > 1.17 >1.16. Patient has preexisting elevated creat prior to pregnancy. Continue hydration and will trend in AM.  . Doing well, continue routine postpartum care   LOS: 1 day   Carlyon Shadow 07/06/2020, 7:28 AM

## 2020-07-07 LAB — COMPREHENSIVE METABOLIC PANEL
ALT: 24 U/L (ref 0–44)
AST: 44 U/L — ABNORMAL HIGH (ref 15–41)
Albumin: 2.8 g/dL — ABNORMAL LOW (ref 3.5–5.0)
Alkaline Phosphatase: 62 U/L (ref 38–126)
Anion gap: 7 (ref 5–15)
BUN: 16 mg/dL (ref 6–20)
CO2: 25 mmol/L (ref 22–32)
Calcium: 8.9 mg/dL (ref 8.9–10.3)
Chloride: 106 mmol/L (ref 98–111)
Creatinine, Ser: 1.33 mg/dL — ABNORMAL HIGH (ref 0.44–1.00)
GFR, Estimated: 53 mL/min — ABNORMAL LOW (ref >60)
Glucose, Bld: 85 mg/dL (ref 70–99)
Potassium: 4.1 mmol/L (ref 3.5–5.1)
Sodium: 138 mmol/L (ref 135–145)
Total Bilirubin: 0.6 mg/dL (ref 0.3–1.2)
Total Protein: 5.6 g/dL — ABNORMAL LOW (ref 6.5–8.1)

## 2020-07-07 LAB — SURGICAL PATHOLOGY

## 2020-07-07 MED ORDER — BUSPIRONE HCL 5 MG PO TABS
7.5000 mg | ORAL_TABLET | Freq: Two times a day (BID) | ORAL | Status: DC
  Administered 2020-07-07 – 2020-07-08 (×3): 7.5 mg via ORAL
  Filled 2020-07-07 (×3): qty 2

## 2020-07-07 MED ORDER — LABETALOL HCL 200 MG PO TABS
200.0000 mg | ORAL_TABLET | Freq: Three times a day (TID) | ORAL | Status: DC
  Administered 2020-07-07 – 2020-07-08 (×4): 200 mg via ORAL
  Filled 2020-07-07 (×3): qty 1

## 2020-07-07 NOTE — Lactation Note (Signed)
This note was copied from a baby's chart. Lactation Consultation Note  Patient Name: Nancy Rivera TKTCC'E Date: 07/07/2020 Reason for consult: Follow-up assessment;Late-preterm 34-36.6wks;NICU baby;Maternal endocrine disorder Age:37 hours  Visited with mom of 28 hours old LPI female, she's a P2. Mom reports pumping every 4 hours and she's now getting about 15 ml of EBM, praised her for her efforts. Explained to mom the importance of consistent pumping at least 8 times/24 hours since her goal is exclusivity.   Spoke to 5th floor front desk and asked them to get mom some coconut oil, instructed her to apply prior pumping. Per mom pumping session are comfortable, she's been taking her pumping kit to the NICU. Reviewed pumping schedule, lactogenesis II and benefits of breastmilk for NICU babies.  Feeding plan:  1. Encouraged mom to start pumping every 2-3 hours, ideally 8 pumping sessions/24 hours 2. Hand expression and breast massage were also encouraged prior pumping  FOB present at the time of [facility] consultation, this was second attempt to see mom; she was in the NICU visiting her baby; mom is anticipating to be discharged tomorrow. Parents reported all questions and concerns were answered, they're both aware of [facility] services and will call PRN.   Maternal Data    Feeding Mother's Current Feeding Choice: Breast Milk and Donor Milk  LATCH Score                    Lactation Tools Discussed/Used Tools: Pump Breast pump type: Double-Electric Breast Pump Pump Education: Setup, frequency, and cleaning;Milk Storage Reason for Pumping: LPI in NICU Pumping frequency: q 3 hours (but mom is doing q 4hours) Pumped volume: 15 mL  Interventions Interventions: Breast feeding basics reviewed;DEBP  Discharge    Consult Status Consult Status: Follow-up Date: 07/08/20 Follow-up type: In-patient    Nancy Rivera Nancy Rivera 07/07/2020, 1:52 PM

## 2020-07-07 NOTE — Progress Notes (Signed)
Subjective: Postpartum Day 2: Cesarean Delivery Patient reports tolerating PO and no problems voiding. Reports chest pressure/tightness resolved yesterday after calming down. Reports she felt like she had a pending panic attack overnight and feels that she needs to restart her home meds. She has a history of anxiety and takes buspar twice a day - has not gotten a dose of it in > 24 hours. Otherwise denies HA, blurry vision, CP, SOB, fevers, or abdominal pain outside incisional soreness. She visits the NICU often with her son who is doing well.   Objective: Vital signs in last 24 hours: Temp:  [97.9 F (36.6 C)-98 F (36.7 C)] 98 F (36.7 C) (04/12 2052) Pulse Rate:  [59-73] 71 (04/13 0704) Resp:  [17-18] 18 (04/13 0704) BP: (141-149)/(63-83) 141/83 (04/13 0704) SpO2:  [98 %-100 %] 100 % (04/13 0704)  Physical Exam:  General: alert Lochia: appropriate Uterine Fundus: firm Incision: healing well DVT Evaluation: No evidence of DVT seen on physical exam.  Recent Labs    07/05/20 1047 07/06/20 0528  HGB 12.3 10.4*  HCT 37.8 30.9*    Assessment/Plan: Status post Cesarean section. Doing well postoperatively.  CRT elevation - longstanding. Stable. Defer repeat unless s/s worsening disease.  cHTN - BP mild range but persistently so. Will increase Labetalol to 200mg  TID Anxiety - restart her home Buspar. Minimal data on breastfeeding with buspar but up to 45 mg daily produce very low levels in milk. She is only on 15mg  total daily. Once acute anxiety is controlled, will consider different long term agent.    Tyson Dense 07/07/2020, 7:54 AM

## 2020-07-08 MED ORDER — IBUPROFEN 600 MG PO TABS
600.0000 mg | ORAL_TABLET | Freq: Four times a day (QID) | ORAL | 0 refills | Status: DC | PRN
Start: 2020-07-08 — End: 2020-09-02

## 2020-07-08 MED ORDER — LABETALOL HCL 200 MG PO TABS: 200.0000 mg | ORAL_TABLET | Freq: Three times a day (TID) | ORAL | 1 refills | Status: DC

## 2020-07-08 NOTE — Lactation Note (Incomplete)
This note was copied from a baby's chart. Lactation Consultation Note Baby 69 hrs old in NICU. RN asked LC to see mom d/t breast tenderness, knots and pain. Breast are filling, knots noted to deep sides. Breast still soft. Coconut oil applied before pumping. Massaged breast and breast compressions while mom pumped. Mom pumped 50 ml transitional milk.  Engorgement management, milk storage for NICU baby, hand free bra, breast massage, resting discussed. Mom stated she is going home today. She is going to leave her Medela pump parts in NICU baby rm. And use her Spectra while she is at home.  Talked w/mom about assessing breast for times of pumping if needed before 3  Hrs. Mom may need to pump every 2 1/2 hrs.  Patient Name: Nancy Rivera [id number] Date: 07/08/2020 Reason for consult: Follow-up assessment;Engorgement;Late-preterm 34-36.6wks;NICU baby Age:37 hours  Maternal Data Has patient been taught Hand Expression?: Yes Does the patient have breastfeeding experience prior to this delivery?: Yes  Feeding    LATCH Score       Type of Nipple: Everted at rest and after stimulation  Comfort (Breast/Nipple): Filling, red/small blisters or bruises, mild/mod discomfort (breast filling, some knots noted)         Lactation Tools Discussed/Used    Interventions Interventions: Coconut oil;DEBP;Breast compression;Breast massage  Discharge Discharge Education: Engorgement and breast care  Consult Status Consult Status: Follow-up Date: 07/08/20 Follow-up type: In-patient    Theodoro Kalata 07/08/2020, 12:55 AM

## 2020-07-08 NOTE — Lactation Note (Signed)
This note was copied from a baby's chart. Lactation Consultation Note  Patient Name: Nancy Rivera Today's Date: 07/08/2020 Reason for consult: NICU baby;Follow-up assessment Age:37 hours  Follow up visit to P2 mother of 69 hours old LPTI currently in NICU. Mother is getting discharged home today. Mother states pumping is going well and last pumping session she collected ~50mL.   Talked about breast massage prior to pumping. Reviewed pumping with maintenance setting, paying attention to let down in order to empty breasts completely.  Mother will keep pumping kit to pump at infant's bedside. Mother has a personal pump at home.   Plan: 1-Pump using maintenance setting 8-12 times x 24h for breast stimulation 2-Contact LC as needed for feeds/support/concerns/questions  Maternal Data Has patient been taught Hand Expression?: Yes Does the patient have breastfeeding experience prior to this delivery?: Yes  Feeding Mother's Current Feeding Choice: Breast Milk and Donor Milk  Lactation Tools Discussed/Used Tools: Flanges;Pump;Coconut oil Flange Size: 24;27 Breast pump type: Double-Electric Breast Pump Reason for Pumping: maternal infant separation Pumping frequency: Q3 Pumped volume: 50 mL  Interventions Interventions: Education;Expressed milk;Hand express;Breast massage;Coconut oil;DEBP;Breast feeding basics reviewed  Discharge Discharge Education: Engorgement and breast care Pump: Personal  Consult Status Consult Status: Follow-up Date: 07/08/20 Follow-up type: In-patient    Linels A Higuera Ancidey 07/08/2020, 8:10 AM    

## 2020-07-08 NOTE — Discharge Summary (Signed)
Postpartum Discharge Summary  Date of Service updated4/14/22     Patient Name: Nancy Rivera DOB: Oct 11, 1985 MRN: [id number]  Date of admission: 07/04/2020 Delivery date:07/05/2020  Delivering provider: Everlene Farrier  Date of discharge: 07/08/2020  Admitting diagnosis: Chronic hypertension affecting pregnancy [O10.919] Cesarean delivery delivered [O82] Intrauterine pregnancy: [redacted]w[redacted]d    Secondary diagnosis:  Active Problems:   Chronic hypertension affecting pregnancy   Cesarean delivery delivered  Additional problems:     Discharge diagnosis: Preterm Pregnancy Delivered and CHTN                                              Post partum procedures: Augmentation: N/A Complications: None  Hospital course: Sceduled C/S   35y.o. yo G2P1102 at 363w2das admitted to the hospital 07/04/2020 for scheduled cesarean section with the following indication:Non-Reassuring FHR.Delivery details are as follows:  Membrane Rupture Time/Date: 11:10 AM ,07/05/2020   Delivery Method:C-Section, Vacuum Assisted  Details of operation can be found in separate operative note.  Patient had an uncomplicated postpartum course.  She is ambulating, tolerating a regular diet, passing flatus, and urinating well. Patient is discharged home in stable condition on  07/08/20        Newborn Data: Birth date:07/05/2020  Birth time:11:13 AM  Gender:Female  Living status:Living  Apgars:8 ,9  Weight:2805 g     Magnesium Sulfate received: No BMZ received: Yes Rhophylac:No MMR:No T-DaP:Given prenatally Flu: No Transfusion:No  Physical exam  Vitals:   07/07/20 0704 07/07/20 1316 07/07/20 2135 07/08/20 0526  BP: (!) 141/83 133/72 (!) 146/89 131/81  Pulse: 71 90 78 71  Resp: _0 Temp:  98.3 F (36.8 C) 98.3 F (36.8 C) 98.2 F (36.8 C)  TempSrc:   Axillary Axillary  SpO2: 100% 99% 99% 99%  Weight:      Height:       General: alert, cooperative and no distress Lochia: appropriate Uterine Fundus:  firm Incision: Healing well with no significant drainage DVT Evaluation: No evidence of DVT seen on physical exam. Labs: Lab Results  Component Value Date   WBC 12.8 (H) 07/06/2020   HGB 10.4 (L) 07/06/2020   HCT 30.9 (L) 07/06/2020   MCV 84.0 07/06/2020   PLT 187 07/06/2020   CMP Latest Ref Rng & Units 07/07/2020  Glucose 70 - 99 mg/dL 85  BUN 6 - 20 mg/dL 16  Creatinine 0.44 - 1.00 mg/dL 1.33(H)  Sodium 135 - 145 mmol/L 138  Potassium 3.5 - 5.1 mmol/L 4.1  Chloride 98 - 111 mmol/L 106  CO2 22 - 32 mmol/L 25  Calcium 8.9 - 10.3 mg/dL 8.9  Total Protein 6.5 - 8.1 g/dL 5.6(L)  Total Bilirubin 0.3 - 1.2 mg/dL 0.6  Alkaline Phos 38 - 126 U/L 62  AST 15 - 41 U/L 44(H)  ALT 0 - 44 U/L 24   Edinburgh Score: Edinburgh Postnatal Depression Scale Screening Tool 07/05/2020  I have been able to laugh and see the funny side of things. 0  I have looked forward with enjoyment to things. 0  I have blamed myself unnecessarily when things went wrong. 1  I have been anxious or worried for no good reason. 1  I have felt scared or panicky for no good reason. 0  Things have been getting on top of me. 0  I have been  so unhappy that I have had difficulty sleeping. 0  I have felt sad or miserable. 0  I have been so unhappy that I have been crying. 1  The thought of harming myself has occurred to me. 0  Edinburgh Postnatal Depression Scale Total 3      After visit meds:  Allergies as of 07/08/2020   No Known Allergies     Medication List    STOP taking these medications   aspirin EC 81 MG tablet   triamcinolone cream 0.1 % Commonly known as: KENALOG     TAKE these medications   acetaminophen 500 MG tablet Commonly known as: TYLENOL Take 1,000 mg by mouth every 6 (six) hours as needed for mild pain or headache.   busPIRone 7.5 MG tablet Commonly known as: BUSPAR Take 7.5 mg by mouth 2 (two) times daily.   calcium carbonate 500 MG chewable tablet Commonly known as: TUMS - dosed  in mg elemental calcium Chew 2 tablets by mouth 3 (three) times daily as needed for indigestion or heartburn.   docusate sodium 100 MG capsule Commonly known as: COLACE Take 100 mg by mouth at bedtime.   ibuprofen 600 MG tablet Commonly known as: ADVIL Take 1 tablet (600 mg total) by mouth every 6 (six) hours as needed for moderate pain.   labetalol 200 MG tablet Commonly known as: NORMODYNE Take 1 tablet (200 mg total) by mouth 3 (three) times daily. What changed: when to take this   omeprazole 20 MG capsule Commonly known as: PRILOSEC Take 20 mg by mouth daily.   prenatal multivitamin Tabs tablet Take 1 tablet by mouth at bedtime.        Discharge home in stable condition Infant Feeding: NICU Infant Disposition:NICU Discharge instruction: per After Visit Summary and Postpartum booklet. Activity: Advance as tolerated. Pelvic rest for 6 weeks.  Diet: routine diet Anticipated Birth Control: Unsure Postpartum Appointment:6 weeks Additional Postpartum F/U: BP check 1 week Future Appointments:No future appointments. Follow up Visit:      07/08/2020 Allena Katz, MD

## 2020-07-08 NOTE — Progress Notes (Signed)
Subjective: Postpartum Day 3: Cesarean Delivery Patient reports tolerating PO, + flatus and no problems voiding.   No chest pain. Buspar working well to control anxiety. Objective: Vital signs in last 24 hours: Temp:  [98.2 F (36.8 C)-98.3 F (36.8 C)] 98.2 F (36.8 C) (04/14 0526) Pulse Rate:  [71-90] 71 (04/14 0526) Resp:  [17-18] 17 (04/14 0526) BP: (131-146)/(72-89) 131/81 (04/14 0526) SpO2:  [99 %] 99 % (04/14 0526)  Physical Exam:  General: alert, cooperative and no distress Lochia: appropriate Uterine Fundus: firm Incision: healing well DVT Evaluation: No evidence of DVT seen on physical exam.  Recent Labs    07/05/20 1047 07/06/20 0528  HGB 12.3 10.4*  HCT 37.8 30.9*    Assessment/Plan: Status post Cesarean section. Doing well postoperatively.  Discharge home with standard precautions and return to clinic in 1 week for BP check. Baby was intubated last pm. She prefers discharge today. Continue labetalol 200mg  TID BP check in office 1 week  Shon Millet II 07/08/2020, 7:49 AM

## 2020-07-09 ENCOUNTER — Ambulatory Visit: Payer: Self-pay

## 2020-07-09 NOTE — Lactation Note (Signed)
This note was copied from a baby's chart. Lactation Consultation Note LC to room at mom's request. She is pumping frequently and with an increasing volume. No engorgement today. Provided a hands-free top so she can massage breasts while pumping to improve emptying. Mom is aware of [facility] services. Will plan f/u visit next week.  Patient Name: Nancy Rivera [id number] Date: 07/09/2020 Reason for consult: NICU baby;Mother's request Age:37 days   Feeding Mother's Current Feeding Choice: Breast Milk and Donor Milk   Lactation Tools Discussed/Used Pumping frequency: q3 Pumped volume: 70 mL   Consult Status Consult Status: Follow-up Follow-up type: In-patient   Gwynne Edinger, MA IBCLC 07/09/2020, 10:41 AM

## 2020-07-17 ENCOUNTER — Ambulatory Visit: Payer: Self-pay

## 2020-07-17 NOTE — Lactation Note (Signed)
This note was copied from a baby's chart. Lactation Consultation Note LC to room for f/u visit. Mom was attempting to bf during visit. Baby remained sleepy and did not wake to feed. We reviewed normalcy of his sleepiness and her milk supply. LC encouraged mom to continue lick & learn phase. We also reviewed IDF algorithm. Mom is aware of [facility] services. Will plan f/u visit next week.  Patient Name: Nancy Rivera [id number] Date: 07/17/2020 Reason for consult: NICU baby;Follow-up assessment Age:37 days  Maternal Data  Mom is pumping 6-7xday with yield between 45-88mls each time.   Feeding Mother's Current Feeding Choice: Breast Milk   Consult Status Consult Status: Follow-up Follow-up type: In-patient   Gwynne Edinger, MA IBCLC 07/17/2020, 12:33 PM

## 2020-07-24 ENCOUNTER — Ambulatory Visit: Payer: Self-pay

## 2020-07-24 NOTE — Lactation Note (Signed)
This note was copied from a baby's chart. Lactation Consultation Note LC to room for f/u. Mom prefers to bottle feed only while in NICU. She may challenge at the breast p d/c. Mother is aware that [facility] staff is available to assist with positioning/latch prn. Mom's milk supply is wnl. She had no questions or concerns during this visit.  Patient Name: Nancy Rivera [id number] Date: 07/24/2020 Reason for consult: NICU baby;Follow-up assessment Age:37 wk.o.  Maternal Data  Mom continues to pump 6-7 x day with average of 60-36mls per session  Feeding Mother's Current Feeding Choice: Breast Milk Nipple Type: Other (DBUP wide)     Consult Status Consult Status: Follow-up Follow-up type: [place], MA IBCLC 07/24/2020, 2:18 PM

## 2020-07-25 ENCOUNTER — Ambulatory Visit: Payer: Self-pay

## 2020-07-25 NOTE — Lactation Note (Signed)
This note was copied from a baby's chart. Lactation Consultation Note  Patient Name: Boy Kimberli Winne [id number] Date: 07/25/2020 Reason for consult: Follow-up assessment;NICU baby;Maternal endocrine disorder;Early term 37-38.6wks Age:37 wk.o.  Visited with mom of 37 80/59 week old (adjusted) NICU female, she's a P2. Mom voiced that she'll continue pumping and bottle feeding during the day and plans to put baby at the breast at night. Asked mom if she'd like to see [facility] to work on the latch but she declined referral at this point.  Mom continue pumping every 3 hours and she's planning on building up her "stash" of breastmilk before she goes back to work in the beginning of June. Reviewed supply and demand, pumping schedule, diet, hydration and breastmilk storage guidelines.  FOB present at the time of [facility] consultation. Mom appreciative of LC for stopping by prior taking baby home. She reported all questions and concerns were answered, she's aware of [facility] services and will call PRN.  Maternal Data    Feeding Mother's Current Feeding Choice: Breast Milk and Formula Nipple Type: Dr. Clement Husbands  [facility] Score                    Lactation Tools Discussed/Used Tools: Pump Breast pump type: Double-Electric Breast Pump  Interventions Interventions: Breast feeding basics reviewed;Education;DEBP  Discharge Discharge Education: Outpatient recommendation;Engorgement and breast care Pump: DEBP;Personal  Consult Status Consult Status: Complete Date: 07/25/20 Follow-up type: Call as needed    [place] 07/25/2020, 6:14 PM

## 2020-07-26 DIAGNOSIS — I10 Essential (primary) hypertension: Secondary | ICD-10-CM | POA: Diagnosis not present

## 2020-08-06 DIAGNOSIS — L03019 Cellulitis of unspecified finger: Secondary | ICD-10-CM | POA: Diagnosis not present

## 2020-08-18 DIAGNOSIS — Z124 Encounter for screening for malignant neoplasm of cervix: Secondary | ICD-10-CM | POA: Diagnosis not present

## 2020-08-18 DIAGNOSIS — Z1389 Encounter for screening for other disorder: Secondary | ICD-10-CM | POA: Diagnosis not present

## 2020-08-18 DIAGNOSIS — F419 Anxiety disorder, unspecified: Secondary | ICD-10-CM | POA: Diagnosis not present

## 2020-08-27 DIAGNOSIS — M25561 Pain in right knee: Secondary | ICD-10-CM | POA: Diagnosis not present

## 2020-08-31 ENCOUNTER — Other Ambulatory Visit (HOSPITAL_COMMUNITY): Payer: Self-pay | Admitting: Sports Medicine

## 2020-08-31 ENCOUNTER — Other Ambulatory Visit: Payer: Self-pay

## 2020-08-31 ENCOUNTER — Ambulatory Visit (HOSPITAL_COMMUNITY)
Admission: RE | Admit: 2020-08-31 | Discharge: 2020-08-31 | Disposition: A | Discharge status: Home / Self Care | Payer: BC Managed Care – PPO | Source: Ambulatory Visit | Attending: Sports Medicine | Admitting: Sports Medicine

## 2020-08-31 DIAGNOSIS — M79604 Pain in right leg: Secondary | ICD-10-CM | POA: Insufficient documentation

## 2020-08-31 DIAGNOSIS — I82431 Acute embolism and thrombosis of right popliteal vein: Secondary | ICD-10-CM | POA: Diagnosis not present

## 2020-08-31 DIAGNOSIS — M25561 Pain in right knee: Secondary | ICD-10-CM | POA: Diagnosis not present

## 2020-08-31 DIAGNOSIS — M7989 Other specified soft tissue disorders: Secondary | ICD-10-CM | POA: Insufficient documentation

## 2020-08-31 NOTE — Progress Notes (Signed)
Right lower extremity venous duplex has been completed. Preliminary results can be found in CV Proc through chart review.  Results were given to Dr. Sheppard Coil.  08/31/20 1:18 PM Nancy Rivera RVT

## 2020-09-02 ENCOUNTER — Emergency Department (HOSPITAL_BASED_OUTPATIENT_CLINIC_OR_DEPARTMENT_OTHER): Payer: BC Managed Care – PPO

## 2020-09-02 ENCOUNTER — Observation Stay (HOSPITAL_BASED_OUTPATIENT_CLINIC_OR_DEPARTMENT_OTHER)
Admission: EM | Admit: 2020-09-02 | Discharge: 2020-09-04 | Disposition: A | Discharge status: Home / Self Care | Payer: BC Managed Care – PPO | Attending: Family Medicine | Admitting: Family Medicine

## 2020-09-02 ENCOUNTER — Other Ambulatory Visit: Payer: Self-pay

## 2020-09-02 ENCOUNTER — Encounter (HOSPITAL_BASED_OUTPATIENT_CLINIC_OR_DEPARTMENT_OTHER): Payer: Self-pay | Admitting: *Deleted

## 2020-09-02 DIAGNOSIS — I129 Hypertensive chronic kidney disease with stage 1 through stage 4 chronic kidney disease, or unspecified chronic kidney disease: Secondary | ICD-10-CM | POA: Insufficient documentation

## 2020-09-02 DIAGNOSIS — Z87891 Personal history of nicotine dependence: Secondary | ICD-10-CM | POA: Diagnosis not present

## 2020-09-02 DIAGNOSIS — Z20822 Contact with and (suspected) exposure to covid-19: Secondary | ICD-10-CM | POA: Diagnosis not present

## 2020-09-02 DIAGNOSIS — I82441 Acute embolism and thrombosis of right tibial vein: Secondary | ICD-10-CM | POA: Diagnosis not present

## 2020-09-02 DIAGNOSIS — M79604 Pain in right leg: Secondary | ICD-10-CM | POA: Diagnosis not present

## 2020-09-02 DIAGNOSIS — I10 Essential (primary) hypertension: Secondary | ICD-10-CM | POA: Diagnosis not present

## 2020-09-02 DIAGNOSIS — I82409 Acute embolism and thrombosis of unspecified deep veins of unspecified lower extremity: Secondary | ICD-10-CM | POA: Diagnosis present

## 2020-09-02 DIAGNOSIS — I82411 Acute embolism and thrombosis of right femoral vein: Secondary | ICD-10-CM | POA: Diagnosis not present

## 2020-09-02 DIAGNOSIS — Z79899 Other long term (current) drug therapy: Secondary | ICD-10-CM | POA: Diagnosis not present

## 2020-09-02 DIAGNOSIS — F32A Depression, unspecified: Secondary | ICD-10-CM

## 2020-09-02 DIAGNOSIS — Z7901 Long term (current) use of anticoagulants: Secondary | ICD-10-CM | POA: Diagnosis not present

## 2020-09-02 DIAGNOSIS — N182 Chronic kidney disease, stage 2 (mild): Secondary | ICD-10-CM | POA: Insufficient documentation

## 2020-09-02 DIAGNOSIS — I82509 Chronic embolism and thrombosis of unspecified deep veins of unspecified lower extremity: Secondary | ICD-10-CM | POA: Diagnosis present

## 2020-09-02 DIAGNOSIS — I82431 Acute embolism and thrombosis of right popliteal vein: Secondary | ICD-10-CM | POA: Diagnosis not present

## 2020-09-02 DIAGNOSIS — Z3040 Encounter for surveillance of contraceptives, unspecified: Secondary | ICD-10-CM | POA: Diagnosis not present

## 2020-09-02 HISTORY — DX: Depression, unspecified: F32.A

## 2020-09-02 HISTORY — DX: Acute embolism and thrombosis of unspecified deep veins of unspecified lower extremity: I82.409

## 2020-09-02 HISTORY — DX: Essential (primary) hypertension: I10

## 2020-09-02 LAB — CBC WITH DIFFERENTIAL/PLATELET
Abs Immature Granulocytes: 0.01 10*3/uL (ref 0.00–0.07)
Basophils Absolute: 0.1 10*3/uL (ref 0.0–0.1)
Basophils Relative: 1 %
Eosinophils Absolute: 0.3 10*3/uL (ref 0.0–0.5)
Eosinophils Relative: 4 %
HCT: 37.5 % (ref 36.0–46.0)
Hemoglobin: 12 g/dL (ref 12.0–15.0)
Immature Granulocytes: 0 %
Lymphocytes Relative: 18 %
Lymphs Abs: 1.4 10*3/uL (ref 0.7–4.0)
MCH: 26 pg (ref 26.0–34.0)
MCHC: 32 g/dL (ref 30.0–36.0)
MCV: 81.2 fL (ref 80.0–100.0)
Monocytes Absolute: 0.4 10*3/uL (ref 0.1–1.0)
Monocytes Relative: 5 %
Neutro Abs: 5.7 10*3/uL (ref 1.7–7.7)
Neutrophils Relative %: 72 %
Platelets: 222 10*3/uL (ref 150–400)
RBC: 4.62 MIL/uL (ref 3.87–5.11)
RDW: 13.3 % (ref 11.5–15.5)
WBC: 7.9 10*3/uL (ref 4.0–10.5)
nRBC: 0 % (ref 0.0–0.2)

## 2020-09-02 LAB — BASIC METABOLIC PANEL
Anion gap: 11 (ref 5–15)
BUN: 16 mg/dL (ref 6–20)
CO2: 23 mmol/L (ref 22–32)
Calcium: 9 mg/dL (ref 8.9–10.3)
Chloride: 104 mmol/L (ref 98–111)
Creatinine, Ser: 1.26 mg/dL — ABNORMAL HIGH (ref 0.44–1.00)
GFR, Estimated: 56 mL/min — ABNORMAL LOW (ref >60)
Glucose, Bld: 101 mg/dL — ABNORMAL HIGH (ref 70–99)
Potassium: 4.1 mmol/L (ref 3.5–5.1)
Sodium: 138 mmol/L (ref 135–145)

## 2020-09-02 LAB — HIV ANTIBODY (ROUTINE TESTING W REFLEX): HIV Screen 4th Generation wRfx: NONREACTIVE

## 2020-09-02 LAB — RESP PANEL BY RT-PCR (FLU A&B, COVID) ARPGX2
Influenza A by PCR: NEGATIVE
Influenza B by PCR: NEGATIVE
SARS Coronavirus 2 by RT PCR: NEGATIVE

## 2020-09-02 LAB — HEPARIN LEVEL (UNFRACTIONATED)
Heparin Unfractionated: >1.1 IU/mL — ABNORMAL HIGH (ref 0.30–0.70)
Heparin Unfractionated: >1.1 IU/mL — ABNORMAL HIGH (ref 0.30–0.70)

## 2020-09-02 LAB — PROTIME-INR
INR: 1.9 — ABNORMAL HIGH (ref 0.8–1.2)
Prothrombin Time: 22 seconds — ABNORMAL HIGH (ref 11.4–15.2)

## 2020-09-02 LAB — APTT
aPTT: 38 seconds — ABNORMAL HIGH (ref 24–36)
aPTT: 42 seconds — ABNORMAL HIGH (ref 24–36)

## 2020-09-02 MED ORDER — ACETAMINOPHEN 325 MG PO TABS
650.0000 mg | ORAL_TABLET | Freq: Four times a day (QID) | ORAL | Status: DC | PRN
  Administered 2020-09-02: 650 mg via ORAL
  Filled 2020-09-02: qty 2

## 2020-09-02 MED ORDER — LABETALOL HCL 200 MG PO TABS
200.0000 mg | ORAL_TABLET | Freq: Two times a day (BID) | ORAL | Status: DC
  Administered 2020-09-02 – 2020-09-03 (×3): 200 mg via ORAL
  Filled 2020-09-02 (×3): qty 1

## 2020-09-02 MED ORDER — POLYETHYLENE GLYCOL 3350 17 G PO PACK: 17.0000 g | PACK | Freq: Every day | ORAL | Status: DC | PRN

## 2020-09-02 MED ORDER — ONDANSETRON HCL 4 MG/2ML IJ SOLN: 4.0000 mg | Freq: Four times a day (QID) | INTRAMUSCULAR | Status: DC | PRN

## 2020-09-02 MED ORDER — SERTRALINE HCL 50 MG PO TABS
50.0000 mg | ORAL_TABLET | Freq: Every day | ORAL | Status: DC
  Administered 2020-09-02 – 2020-09-03 (×2): 50 mg via ORAL
  Filled 2020-09-02 (×2): qty 1

## 2020-09-02 MED ORDER — OXYCODONE HCL 5 MG PO TABS: 5.0000 mg | ORAL_TABLET | ORAL | Status: DC | PRN

## 2020-09-02 MED ORDER — ONDANSETRON HCL 4 MG PO TABS: 4.0000 mg | ORAL_TABLET | Freq: Four times a day (QID) | ORAL | Status: DC | PRN

## 2020-09-02 MED ORDER — HEPARIN (PORCINE) 25000 UT/250ML-% IV SOLN
2200.0000 [IU]/h | INTRAVENOUS | Status: DC
  Administered 2020-09-02: 1500 [IU]/h via INTRAVENOUS
  Administered 2020-09-03: 1850 [IU]/h via INTRAVENOUS
  Filled 2020-09-02 (×2): qty 250

## 2020-09-02 MED ORDER — ACETAMINOPHEN 325 MG PO TABS
650.0000 mg | ORAL_TABLET | Freq: Once | ORAL | Status: AC
  Administered 2020-09-02: 650 mg via ORAL
  Filled 2020-09-02: qty 2

## 2020-09-02 MED ORDER — ACETAMINOPHEN 650 MG RE SUPP: 650.0000 mg | Freq: Four times a day (QID) | RECTAL | Status: DC | PRN

## 2020-09-02 NOTE — ED Provider Notes (Signed)
[facility] Provider Note  CSN: [id number] Arrival date & time: 09/02/20 1028    History Chief Complaint  Patient presents with   Leg Pain     Leg Pain  Nancy Rivera is a 35 y.o. female who is about 2 months post-partum from c-section delivery started estrogen birth control 2 weeks ago. About 7 days ago she noticed some swelling in her R leg. Went to see her doctor 3 days ago and sent for outpatient doppler which showed DVT from R femoral vein down to gastroc. She was given samples of Eliquis and RX for same, she took the Eliquis for 2 days but insurance wouldn't cover Rx so she was switched to Xarelto which she took this morning. She reports increased pain especially in her L medial and posterior thigh and worsening swelling. She was seen by PCP and advised to come to the ED for evaluation. She denies any CP or SOB.    Past Medical History:  Diagnosis Date   Anxiety    Hypertension    Obesity    Oligomenorrhea    PCOS (polycystic ovarian syndrome)     Past Surgical History:  Procedure Laterality Date   Benign cyst removed from eyebrow as an infant     CESAREAN SECTION N/A 07/05/2020   Procedure: CESAREAN SECTION;  Surgeon: Everlene Farrier, MD;  Location: [facility];  Service: Obstetrics;  Laterality: N/A;   REMOVAL OF CYST  1985   FROM UNDER LEFT EYEBROW    Family History  Problem Relation Age of Onset   Cancer Maternal Grandfather        LUNG   Cancer Paternal Grandfather        LYMPHOMA    Social History   Tobacco Use   Smoking status: Former    Packs/day: 0.50    Pack years: 0.00    Types: Cigarettes   Smokeless tobacco: Never  Vaping Use   Vaping Use: Never used  Substance Use Topics   Alcohol use: No   Drug use: No     Home Medications Prior to Admission medications   Medication Sig Start Date End Date Taking? Authorizing Provider  busPIRone (BUSPAR) 7.5 MG tablet Take 7.5 mg by mouth 2 (two) times daily. Prn   Yes  [provider]  labetalol (NORMODYNE) 200 MG tablet Take 1 tablet (200 mg total) by mouth 3 (three) times daily. Patient taking differently: Take 200 mg by mouth 2 (two) times daily. 07/08/20  Yes Everlene Farrier, MD  Rivaroxaban (XARELTO) 15 MG TABS tablet Take 15 mg by mouth 2 (two) times daily with a meal.   Yes [provider]  sertraline (ZOLOFT) 50 MG tablet Take 50 mg by mouth at bedtime.   Yes [provider]  acetaminophen (TYLENOL) 500 MG tablet Take 1,000 mg by mouth every 6 (six) hours as needed for mild pain or headache.    [provider]     Allergies    Patient has no known allergies.   Review of Systems   Review of Systems A comprehensive review of systems was completed and negative except as noted in HPI.    Physical Exam BP 122/69 (BP Location: Right Arm)   Pulse 87   Temp 99.3 F (37.4 C) (Oral)   Resp 16   Ht 5\' 9"  (1.753 m)   Wt (!) 137.9 kg   LMP 11/01/2019 (Exact Date)   SpO2 100%   BMI 44.89 kg/m   Physical  Exam Vitals and nursing note reviewed.  Constitutional:      Appearance: Normal appearance.  HENT:     Head: Normocephalic and atraumatic.     Nose: Nose normal.     Mouth/Throat:     Mouth: Mucous membranes are moist.  Eyes:     Extraocular Movements: Extraocular movements intact.     Conjunctiva/sclera: Conjunctivae normal.  Cardiovascular:     Rate and Rhythm: Normal rate.  Pulmonary:     Effort: Pulmonary effort is normal.     Breath sounds: Normal breath sounds.  Abdominal:     General: Abdomen is flat.     Palpations: Abdomen is soft.     Tenderness: There is no abdominal tenderness.  Musculoskeletal:        General: Swelling present. Normal range of motion.     Cervical back: Neck supple.     Comments: Marked swelling of the RLE compared to left. Erythema/petechiae developing below knee, see photos below.   Skin:    General: Skin is warm and dry.  Neurological:     General: No focal  deficit present.     Mental Status: She is alert.  Psychiatric:        Mood and Affect: Mood normal.        ED Results / Procedures / Treatments   Labs (all labs ordered are listed, but only abnormal results are displayed) Labs Reviewed  BASIC METABOLIC PANEL - Abnormal; Notable for the following components:      Result Value   Glucose, Bld 101 (*)    Creatinine, Ser 1.26 (*)    GFR, Estimated 56 (*)    All other components within normal limits  PROTIME-INR - Abnormal; Notable for the following components:   Prothrombin Time 22.0 (*)    INR 1.9 (*)    All other components within normal limits  APTT - Abnormal; Notable for the following components:   aPTT 38 (*)    All other components within normal limits  RESP PANEL BY RT-PCR (FLU A&B, COVID) [id number]  CBC WITH DIFFERENTIAL/PLATELET  APTT  HEPARIN LEVEL (UNFRACTIONATED)  HEPARIN LEVEL (UNFRACTIONATED)    EKG None   Radiology US Venous Img Lower Right (DVT Study)  Result Date: 09/02/2020 CLINICAL DATA:  35 year old female with a history of prior right lower extremity DVT EXAM: RIGHT LOWER EXTREMITY VENOUS DOPPLER ULTRASOUND TECHNIQUE: Gray-scale sonography with graded compression, as well as color Doppler and duplex ultrasound were performed to evaluate the lower extremity deep venous systems from the level of the common femoral vein and including the common femoral, femoral, profunda femoral, popliteal and calf veins including the posterior tibial, peroneal and gastrocnemius veins when visible. The superficial great saphenous vein was also interrogated. Spectral Doppler was utilized to evaluate flow at rest and with distal augmentation maneuvers in the common femoral, femoral and popliteal veins. COMPARISON:  Study performed 08/31/2020, with the images available in the epic system FINDINGS: Contralateral Common Femoral Vein: Respiratory phasicity is normal and symmetric with the symptomatic side. No evidence of thrombus. Normal  compressibility. Common Femoral Vein: New thrombus of the right common femoral vein, extending cephalad from the femoral vein. This is partially occlusive, and does not appear to extend into the pelvis. Saphenofemoral Junction: No evidence of thrombus. Normal compressibility and flow on color Doppler imaging. Profunda Femoral Vein: No evidence of thrombus. Normal compressibility and flow on color Doppler imaging. Femoral Vein: Occlusive thrombus through the length of the femoral vein, without compressibility. As compared  to the study dated 08/31/2020, there has been propagation of thrombus more cephalad through the proximal femoral vein into the common femoral vein. Popliteal Vein: Persisting occlusive thrombus of the popliteal vein. Calf Veins: Persisting occlusive thrombus of the tibial veins Superficial Great Saphenous Vein: No evidence of thrombus. Normal compressibility and flow on color Doppler imaging. Other Findings:  None. IMPRESSION: The right lower extremity duplex exam confirms there has been interval propagation of the previously diagnosed right lower extremity DVT, which now extends from the femoral vein into the right common femoral vein. There is no evidence on the current duplex of extension above the hip into the iliac vein. Duplex confirms complete occlusive DVT of the femoral vein, and popliteal vein, with involvement of the tibial veins. Electronically Signed   By: Corrie Mckusick D.O.   On: 09/02/2020 13:05    Procedures Procedures  Medications Ordered in the ED Medications  heparin ADULT infusion 100 units/mL (25000 units/222mL) (1,500 Units/hr Intravenous New Bag/Given 09/02/20 1401)  acetaminophen (TYLENOL) tablet 650 mg (650 mg Oral Given 09/02/20 1409)     MDM Rules/Calculators/A&P MDM  Given worsening symptoms despite oral anticoagulation, will recheck imaging to ensure no further proximal progression. Patient does not have any signs/symptoms of PE.   ED Course  I have  reviewed the triage vital signs and the nursing notes.  Pertinent labs & imaging results that were available during my care of the patient were reviewed by me and considered in my medical decision making (see chart for details).  Clinical Course as of 09/02/20 1451  Thu Sep 02, 2020  1139 CBC is normal.  [CS]  2703 BMP with mildly elevated Cr about at baseline.  [CS]  1319 Doppler shows progression of DVT in CFV. Will begin heparin drip and discuss admission for further monitoring.  [CS]  1339 Spoke with Dr. Bonner Puna, Hospitalist, who will accept for admission.  [CS]    Clinical Course User Index [CS] Truddie Hidden, MD    Final Clinical Impression(s) / ED Diagnoses Final diagnoses:  Acute deep vein thrombosis (DVT) of femoral vein of right lower extremity [facility])    Rx / DC Orders ED Discharge Orders     None        Truddie Hidden, MD 09/02/20 1451

## 2020-09-02 NOTE — Progress Notes (Signed)
ANTICOAGULATION CONSULT NOTE - Follow-Up Consult  Pharmacy Consult for IV heparin Indication: DVT  No Known Allergies  Patient Measurements: Height: 5\' 9"  (175.3 cm) Weight: (!) 137.9 kg (304 lb) IBW/kg (Calculated) : 66.2 Heparin Dosing Weight: 99.3kg  Vital Signs: Temp: 97.9 F (36.6 C) (06/09 2036) Temp Source: Oral (06/09 2036) BP: 143/69 (06/09 2036) Pulse Rate: 80 (06/09 2036)  Labs: Recent Labs    09/02/20 1132 09/02/20 1354 09/02/20 2040  HGB 12.0  --   --   HCT 37.5  --   --   PLT 222  --   --   APTT  --  38* 42*  LABPROT 22.0*  --   --   INR 1.9*  --   --   HEPARINUNFRC  --  >1.10* >1.10*  CREATININE 1.26*  --   --      Estimated Creatinine Clearance: 91.6 mL/min (A) (by C-G formula based on SCr of 1.26 mg/dL (H)).   Medical History: Past Medical History:  Diagnosis Date   Anxiety    Hypertension    Obesity    Oligomenorrhea    PCOS (polycystic ovarian syndrome)    Assessment: 35yoF sent by PCP for evaluation for increased swelling of the R leg. Patient began estrogen-containing birth control approximately 2 weeks ago. She had swelling in R leg and outpt doppler revealed DVT. She was started on Xarelto (Eliquis initially but had a cost issue so switched agents). Pharmacy has been consulted for IV heparin dosing.   aPTT this evening is SUBtherapeutic (aPTT 42, goal of 66-102). Heparin level remains falsely elevated in setting of recent DOAC use. CBC wnl from AM labs. The patient is noted to be on her period but is not bleeding heavily than usual at this point.  Goal of Therapy:  aPTT 66-102 seconds Heparin level 0.3-0.7 units/ml Monitor platelets by anticoagulation protocol: Yes   Plan:  - Increase Heparin to 1850 units/hr (18.5 ml/hr) - Daily aPTT/HL until correlated - Will continue to monitor for any signs/symptoms of bleeding and will follow up with aPTT level in 6 hours   Thank you for allowing pharmacy to be a part of this patient's  care.  Alycia Rossetti, PharmD, BCPS Clinical Pharmacist Clinical phone for 09/02/2020: [phone] 09/02/2020 9:59 PM   **Pharmacist phone directory can now be found on [email or web address] ([email or web address]).  Listed under [place].

## 2020-09-02 NOTE — Progress Notes (Signed)
Patient to room 4E08 from [facility] ED. Vital signs obtained. On monitor CCMD notified. CHG bath completed. Call bell within reach. Alert and oriented to room and call light.  Era Bumpers, RN

## 2020-09-02 NOTE — Progress Notes (Signed)
ANTICOAGULATION CONSULT NOTE - Initial Consult  Pharmacy Consult for IV heparin Indication: DVT  No Known Allergies  Patient Measurements: Height: 5\' 9"  (175.3 cm) Weight: (!) 137.9 kg (304 lb) IBW/kg (Calculated) : 66.2 Heparin Dosing Weight: 99.3kg  Vital Signs: Temp: 99.3 F (37.4 C) (06/09 1042) Temp Source: Oral (06/09 1042) BP: 122/69 (06/09 1245) Pulse Rate: 87 (06/09 1245)  Labs: Recent Labs    09/02/20 1132  HGB 12.0  HCT 37.5  PLT 222  LABPROT 22.0*  INR 1.9*  CREATININE 1.26*    Estimated Creatinine Clearance: 91.6 mL/min (A) (by C-G formula based on SCr of 1.26 mg/dL (H)).   Medical History: Past Medical History:  Diagnosis Date   Anxiety    Hypertension    Obesity    Oligomenorrhea    PCOS (polycystic ovarian syndrome)    Assessment: 35yoF sent by PCP for evaluation for increased swelling of the R leg. Patient began estrogen-containing birth control approximately 2 weeks ago. She had swelling in R leg and outpt doppler revealed DVT. She was started on Xarelto (Eliquis initially but had a cost issue so switched agents). Pharmacy has been consulted for IV heparin dosing.   Last dose of Xarelto taken this morning 6/9 at 0715. Hgb 12, PLT wnl. Initiating heparin now given worsening symptoms but will dose conservatively and without a bolus.   Goal of Therapy:  aPTT 66-102 seconds Heparin level 0.3-0.7 units/ml Monitor platelets by anticoagulation protocol: Yes   Plan:  - Initiate IV heparin infusion 1,500 units/hr  - Follow up with aPTT and heparin level in ~6 hours, follow both until correlating then monitor HLs only  - Monitor CBC, s/sx bleeding - Follow DVT/PE workup, plans for transitioning to PO anticoagulation  Mercy Riding, PharmD PGY1 Acute Care Pharmacy Resident Please refer to [facility] for unit-specific pharmacist

## 2020-09-02 NOTE — ED Triage Notes (Addendum)
Right leg  pain started last Wednesday when the dog tripped her over.  She woke up with a swollen right leg this past Thursday.   Patient was placed on Xeralto by her PCP for DVT to right leg.

## 2020-09-02 NOTE — H&P (Signed)
History and Physical    Nancy Rivera XTG:[id number] DOB: Jun 01, 1985 DOA: 09/02/2020  PCP: London Pepper, MD  Patient coming from: [facility]  I have personally briefly reviewed patient's old medical records in [place]  Chief Complaint: RLE edema  HPI: Nancy Rivera is a 35 y.o. female with medical history significant of essential hypertension, depression who presented as a transfer from [place] for continued lower extremity edema.  Patient recently diagnosed with right lower extremity DVT on 08/31/2020, initially had 3 doses of Eliquis followed by 1 dose of Xarelto this morning.  Patient is 2 months postpartum from C-section delivery and recently started on estrogen OCP 2 weeks ago.  Given continued edema and mild discomfort with ambulation, PCP directed patient back to the ED for further evaluation.  Patient denies headache, no dizziness, no chest pain, no palpitations, no shortness of breath, no abdominal pain, no significant difficulty ambulating, no paresthesias.  ED Course: Temperature 98.9 F, HR 81, RR 20, BP 131/80, SPO2 100% on room air.  WBC 7.9, hemoglobin 12.0, platelets 222.  Sodium 138, potassium 4.1, chloride 104, CO2 23, BUN 16, creatinine 1.26, glucose 101.  COVID-19 PCR negative.  Influenza A/B PCR negative.  Repeat right lower extremity venous duplex ultrasound shows interval propagation of previously diagnosed right lower extremity DVT with complete occlusive DVT femoral vein, popliteal vein and involvement of the tibial veins and now extending from a femoral vein into the right common femoral vein.  No evidence of extension above the hip into the iliac vein.  ED physician discussed case with interventional radiology, no indication for intervention at this time.  Patient was started on heparin drip.  TRH consulted for further evaluation and management of DVT.  Review of Systems:   Constitutional - No Fatigue, No Weight Loss Vision - No impaired  vision, decreased visual acuity Ear/Nose/Mouth/Throat - No decreased hearing, no congestion Respiratory - No shortness of breath, no exertional dyspnea, chronic cough Cardiovascular - No chest pain, no palpitations, no peripheral edema Gastrointestinal - No nausea, no diarrhea, no constipation, Genitourinary - No excessive urination, no urinary incontinence Integumentary - No rashes or concerning skin lesions Neurologic - No numbness, no tingling, no dizziness, no headaches, no confusion or memory loss   Past Medical History:  Diagnosis Date   Anxiety    Hypertension    Obesity    Oligomenorrhea    PCOS (polycystic ovarian syndrome)     Past Surgical History:  Procedure Laterality Date   Benign cyst removed from eyebrow as an infant     CESAREAN SECTION N/A 07/05/2020   Procedure: CESAREAN SECTION;  Surgeon: Everlene Farrier, MD;  Location: [facility];  Service: Obstetrics;  Laterality: N/A;   REMOVAL OF CYST  1985   FROM UNDER LEFT EYEBROW     reports that she has quit smoking. Her smoking use included cigarettes. She smoked an average of 0.50 packs per day. She has never used smokeless tobacco. She reports that she does not drink alcohol and does not use drugs.  No Known Allergies  Family History  Problem Relation Age of Onset   Cancer Maternal Grandfather        LUNG   Cancer Paternal Grandfather        LYMPHOMA    Family history reviewed and not pertinent   Prior to Admission medications   Medication Sig Start Date End Date Taking? Authorizing Provider  acetaminophen (TYLENOL) 500 MG tablet Take 500-1,000 mg by mouth every  6 (six) hours as needed for mild pain or headache.   Yes [provider]  busPIRone (BUSPAR) 7.5 MG tablet Take 7.5 mg by mouth 2 (two) times daily as needed (for anxiety).   Yes [provider]  labetalol (NORMODYNE) 200 MG tablet Take 1 tablet (200 mg total) by mouth 3 (three) times daily. Patient taking differently: Take 200 mg by  mouth 2 (two) times daily. 07/08/20  Yes Everlene Farrier, MD  naproxen sodium (ALEVE) 220 MG tablet Take 220-440 mg by mouth 2 (two) times daily as needed (for mild pain or headaches).   Yes [provider]  Rivaroxaban Stater Pack, 15 mg and 20 mg, (XARELTO STARTER PACK) Take 15-20 mg by mouth See admin instructions. Follow package directions: Take one 15mg  tablet by mouth twice a day. On day 22, switch to one 20mg  tablet once a day. Take with food. 09/02/20  Yes [provider]  sertraline (ZOLOFT) 50 MG tablet Take 50 mg by mouth at bedtime.   Yes [provider]  Rivaroxaban (XARELTO) 15 MG TABS tablet Take 15 mg by mouth 2 (two) times daily with a meal. Patient not taking: Reported on 09/02/2020    [provider]  Brandon Take by mouth See admin instructions. Patient not taking: Reported on 09/02/2020 09/01/20   [provider]    Physical Exam: Vitals:   09/02/20 1245 09/02/20 1345 09/02/20 1500 09/02/20 1719  BP: 122/69 129/80 124/68 131/80  Pulse: 87 72 77 81  Resp: 16   20  Temp:    98.9 F (37.2 C)  TempSrc:    Oral  SpO2: 100% 100% 99% 100%  Weight:      Height:        Constitutional: NAD, calm, comfortable, obese Eyes: PERRL, lids and conjunctivae normal ENMT: Mucous membranes are moist. Posterior pharynx clear of any exudate or lesions.Normal dentition.  Neck: normal, supple, no masses, no thyromegaly Respiratory: clear to auscultation bilaterally, no wheezing, no crackles. Normal respiratory effort. No accessory muscle use.  Cardiovascular: Regular rate and rhythm, no murmurs / rubs / gallops. 2+ pedal pulses. No carotid bruits.  Abdomen: no tenderness, no masses palpated. No hepatosplenomegaly. Bowel sounds positive.  Musculoskeletal: no clubbing / cyanosis. No joint deformity upper and lower extremities. Good ROM, no contractures. Normal muscle tone.  Nonpitting edema bilateral lower extremities, right greater than left.   Neurovascular intact.  No tenderness to palpation right lower extremity. Skin: no rashes, lesions, ulcers. No induration Neurologic: CN 2-12 grossly intact. Sensation intact, DTR normal. Strength 5/5 in all 4.  Psychiatric: Normal judgment and insight. Alert and oriented x 3. Normal mood.    Labs on Admission: I have personally reviewed following labs and imaging studies  CBC: Recent Labs  Lab 09/02/20 1132  WBC 7.9  NEUTROABS 5.7  HGB 12.0  HCT 37.5  MCV 81.2  PLT 161   Basic Metabolic Panel: Recent Labs  Lab 09/02/20 1132  NA 138  K 4.1  CL 104  CO2 23  GLUCOSE 101*  BUN 16  CREATININE 1.26*  CALCIUM 9.0   GFR: Estimated Creatinine Clearance: 91.6 mL/min (A) (by C-G formula based on SCr of 1.26 mg/dL (H)). Liver Function Tests: No results for input(s): AST, ALT, ALKPHOS, BILITOT, PROT, ALBUMIN in the last 168 hours. No results for input(s): LIPASE, AMYLASE in the last 168 hours. No results for input(s): AMMONIA in the last 168 hours. Coagulation Profile: Recent Labs  Lab 09/02/20 1132  INR  1.9*   Cardiac Enzymes: No results for input(s): CKTOTAL, CKMB, CKMBINDEX, TROPONINI in the last 168 hours. BNP (last 3 results) No results for input(s): PROBNP in the last 8760 hours. HbA1C: No results for input(s): HGBA1C in the last 72 hours. CBG: No results for input(s): GLUCAP in the last 168 hours. Lipid Profile: No results for input(s): CHOL, HDL, LDLCALC, TRIG, CHOLHDL, LDLDIRECT in the last 72 hours. Thyroid Function Tests: No results for input(s): TSH, T4TOTAL, FREET4, T3FREE, THYROIDAB in the last 72 hours. Anemia Panel: No results for input(s): VITAMINB12, FOLATE, FERRITIN, TIBC, IRON, RETICCTPCT in the last 72 hours. Urine analysis:    Component Value Date/Time   COLORURINE YELLOW 07/04/2020 1950   APPEARANCEUR CLEAR 07/04/2020 1950   LABSPEC 1.006 07/04/2020 1950   PHURINE 6.0 07/04/2020 1950   GLUCOSEU NEGATIVE 07/04/2020 1950   HGBUR NEGATIVE  07/04/2020 1950   BILIRUBINUR NEGATIVE 07/04/2020 1950   KETONESUR NEGATIVE 07/04/2020 1950   PROTEINUR NEGATIVE 07/04/2020 1950   UROBILINOGEN 0.2 07/24/2013 1940   NITRITE NEGATIVE 07/04/2020 1950   LEUKOCYTESUR NEGATIVE 07/04/2020 1950    Radiological Exams on Admission: US Venous Img Lower Right (DVT Study)  Result Date: 09/02/2020 CLINICAL DATA:  35 year old female with a history of prior right lower extremity DVT EXAM: RIGHT LOWER EXTREMITY VENOUS DOPPLER ULTRASOUND TECHNIQUE: Gray-scale sonography with graded compression, as well as color Doppler and duplex ultrasound were performed to evaluate the lower extremity deep venous systems from the level of the common femoral vein and including the common femoral, femoral, profunda femoral, popliteal and calf veins including the posterior tibial, peroneal and gastrocnemius veins when visible. The superficial great saphenous vein was also interrogated. Spectral Doppler was utilized to evaluate flow at rest and with distal augmentation maneuvers in the common femoral, femoral and popliteal veins. COMPARISON:  Study performed 08/31/2020, with the images available in the epic system FINDINGS: Contralateral Common Femoral Vein: Respiratory phasicity is normal and symmetric with the symptomatic side. No evidence of thrombus. Normal compressibility. Common Femoral Vein: New thrombus of the right common femoral vein, extending cephalad from the femoral vein. This is partially occlusive, and does not appear to extend into the pelvis. Saphenofemoral Junction: No evidence of thrombus. Normal compressibility and flow on color Doppler imaging. Profunda Femoral Vein: No evidence of thrombus. Normal compressibility and flow on color Doppler imaging. Femoral Vein: Occlusive thrombus through the length of the femoral vein, without compressibility. As compared to the study dated 08/31/2020, there has been propagation of thrombus more cephalad through the proximal femoral  vein into the common femoral vein. Popliteal Vein: Persisting occlusive thrombus of the popliteal vein. Calf Veins: Persisting occlusive thrombus of the tibial veins Superficial Great Saphenous Vein: No evidence of thrombus. Normal compressibility and flow on color Doppler imaging. Other Findings:  None. IMPRESSION: The right lower extremity duplex exam confirms there has been interval propagation of the previously diagnosed right lower extremity DVT, which now extends from the femoral vein into the right common femoral vein. There is no evidence on the current duplex of extension above the hip into the iliac vein. Duplex confirms complete occlusive DVT of the femoral vein, and popliteal vein, with involvement of the tibial veins. Electronically Signed   By: Corrie Mckusick D.O.   On: 09/02/2020 13:05    EKG: Independently reviewed.   Assessment/Plan Active Problems:   Acute deep vein thrombosis (DVT) (HCC)   HTN (hypertension)   Depression    Right lower extremity DVT Patient presenting to [facility] with  persistent right lower extremity edema.  Recently diagnosed with right lower extremity DVT on 08/31/2020, and started Eliquis initially [place] 3 doses followed by 1 dose of Xarelto. (Due to insurance covering Xarelto).  Patient was redirected to present to the ED once again by PCP due to persistent swelling and mild pain while ambulation.  Repeat right lower extremity venous duplex ultrasound shows occlusive DVT of the femoral vein, popliteal vein with involvement of the tibial veins with propagation from the femoral vein to the common femoral vein.  DVT does not extend above the hip into the iliac vein.  ED physician discussed case with IR, no need for intervention at this time.  No concerning signs on physical exam for phlegmasia cerulea dolens. --Place in observation, telemetry --Heparin drip, anticipate 24-48 hours before transition back to Xarelto; pharmacy consulted for dosing/monitoring --CBC  in the a.m.  Essential hypertension --Continue home labetalol 200 mg p.o. twice daily  Depression --Continue home Zoloft 50 mg p.o. daily  CKD stage II Creatinine on admission 1.26, was 1.33 on 07/07/2020 with range 1.16-1.33. --Avoid nephrotoxins, renal dose all medications --Outpatient follow-up with PCP  DVT prophylaxis: Heparin drip Code Status: Full code Family Communication: No family present at bedside Disposition Plan: Anticipate discharge home when medically stable Consults called: None Admission status: Observation Level of care: Med-Surg   At the point of initial evaluation, it is my clinical opinion that admission for OBSERVATION is reasonable and necessary because the patient's presenting complaints in the context of their chronic conditions represent sufficient risk of deterioration or significant morbidity to constitute reasonable grounds for close observation in the hospital setting, but that the patient may be medically stable for discharge from the hospital within 24 to 48 hours.   Taquanna Borras J [place] DO [facility] Available via Epic secure chat 7am-7pm After these hours, please refer to coverage provider listed on [email or web address] 09/02/2020, 6:06 PM

## 2020-09-02 NOTE — Plan of Care (Signed)

## 2020-09-02 NOTE — ED Notes (Signed)
ED Provider at bedside. 

## 2020-09-03 DIAGNOSIS — I10 Essential (primary) hypertension: Secondary | ICD-10-CM | POA: Diagnosis not present

## 2020-09-03 DIAGNOSIS — I82411 Acute embolism and thrombosis of right femoral vein: Secondary | ICD-10-CM | POA: Diagnosis not present

## 2020-09-03 DIAGNOSIS — F32A Depression, unspecified: Secondary | ICD-10-CM

## 2020-09-03 LAB — CBC
HCT: 38 % (ref 36.0–46.0)
Hemoglobin: 12 g/dL (ref 12.0–15.0)
MCH: 25.5 pg — ABNORMAL LOW (ref 26.0–34.0)
MCHC: 31.6 g/dL (ref 30.0–36.0)
MCV: 80.9 fL (ref 80.0–100.0)
Platelets: 234 10*3/uL (ref 150–400)
RBC: 4.7 MIL/uL (ref 3.87–5.11)
RDW: 13.2 % (ref 11.5–15.5)
WBC: 8.7 10*3/uL (ref 4.0–10.5)
nRBC: 0 % (ref 0.0–0.2)

## 2020-09-03 LAB — BASIC METABOLIC PANEL
Anion gap: 11 (ref 5–15)
BUN: 12 mg/dL (ref 6–20)
CO2: 25 mmol/L (ref 22–32)
Calcium: 8.9 mg/dL (ref 8.9–10.3)
Chloride: 101 mmol/L (ref 98–111)
Creatinine, Ser: 1.19 mg/dL — ABNORMAL HIGH (ref 0.44–1.00)
GFR, Estimated: >60 mL/min (ref >60)
Glucose, Bld: 114 mg/dL — ABNORMAL HIGH (ref 70–99)
Potassium: 4.2 mmol/L (ref 3.5–5.1)
Sodium: 137 mmol/L (ref 135–145)

## 2020-09-03 LAB — APTT
aPTT: 52 seconds — ABNORMAL HIGH (ref 24–36)
aPTT: 52 seconds — ABNORMAL HIGH (ref 24–36)

## 2020-09-03 MED ORDER — ENOXAPARIN SODIUM 150 MG/ML IJ SOSY
140.0000 mg | PREFILLED_SYRINGE | Freq: Once | INTRAMUSCULAR | Status: AC
  Administered 2020-09-03: 140 mg via SUBCUTANEOUS
  Filled 2020-09-03: qty 0.94

## 2020-09-03 MED ORDER — ENOXAPARIN SODIUM 150 MG/ML IJ SOSY
140.0000 mg | PREFILLED_SYRINGE | Freq: Two times a day (BID) | INTRAMUSCULAR | Status: DC
  Administered 2020-09-04: 140 mg via SUBCUTANEOUS
  Filled 2020-09-03: qty 0.94

## 2020-09-03 NOTE — Progress Notes (Signed)
PROGRESS NOTE    Nancy Rivera  WCB:[id number] DOB: [birth year] DOA: 09/02/2020 PCP: London Pepper, MD   Brief Narrative:  Nancy Rivera is a 35 y.o. female with medical history significant of essential hypertension, depression, morbid obesity who presented as a transfer from [place] for continued lower extremity edema.  Patient recently diagnosed with right lower extremity DVT on 08/31/2020, initially had 3 doses of Eliquis followed by 1 dose of Xarelto this morning.  Patient is 2 months postpartum from C-section delivery and recently started on estrogen OCP 2 weeks ago.  Given continued edema and mild discomfort with ambulation, PCP directed patient back to the ED for further evaluation  ED course: Vital signs: Stable.  Hemoglobin 12, no leukocytosis, kidney function: Within normal limits.  COVID-negative.  Repeat right lower extremity Doppler ultrasound shows interval propagation of previously diagnosed right lower extremity DVT with complete occlusive DVT femoral vein, popliteal vein and involvement of the tibial veins and now extending from a femoral vein into the right common femoral vein.  No evidence of extension above the hip into the iliac vein.  ED physician discussed case with interventional radiology, no indication for intervention at this time.  Patient was started on heparin drip.  TRH consulted for further evaluation and management of DVT.  Assessment & Plan:   Acute right lower extremity DVT: -Patient is hemodynamically stable. - right lower extremity venous duplex ultrasound shows occlusive DVT of the femoral vein, popliteal vein with involvement of the tibial veins with propagation from the femoral vein to the common femoral vein.  DVT does not extend above the hip into the iliac vein -ED physician discussed with IR-recommend no need of intervention at this time -Continue heparin gtt per pharmacy for 48 hours.  aPTT: Subtherapeutic. -Consider switching to Xarelto  tomorrow -Monitor H&H.  Essential hypertension: Well-controlled -Continue labetalol 200 mg twice daily  Depression: Stable  -continue Zoloft 50 mg daily  CKD Stage II: -Kidney function improving.  Creatinine improved from 1.33-1.19 -Monitor closely.  Avoid nephrotoxic medications.  Morbid obesity with BMI of 44: -Diet modification, exercise and weight loss recommended   DVT prophylaxis: heparin Gtt  Code Status: Full code  Family Communication: None present at bedside.  Plan of care discussed with patient in length and he verbalized understanding and agreed with it. Disposition Plan: home  Consultants:  none  Procedures:  none  Antimicrobials:  None    Status is: Observation  Dispo: The patient is from: Home              Anticipated d/c is to: Home              Patient currently is not medically stable to d/c.   Difficult to place patient No    Subjective: Patient seen and examined.  Reports that she continues to have right lower extremity swelling and redness however it is better than yesterday.  Denies chest pain, shortness of breath, palpitation, orthopnea, PND, fever, chills, headache or blurry vision.  Objective: Vitals:   09/02/20 2036 09/03/20 0036 09/03/20 0346 09/03/20 0804  BP: (!) 143/69 128/61 135/68 (!) 142/81  Pulse: 80 68 68 67  Resp: 14 20 16 12   Temp: 97.9 F (36.6 C) 98.4 F (36.9 C) 98.5 F (36.9 C) (!) 97.4 F (36.3 C)  TempSrc: Oral Oral Oral Oral  SpO2: 100% 100% 98% 99%  Weight:      Height:        Intake/Output Summary (Last 24 hours)  at 09/03/2020 0935 Last data filed at 09/03/2020 0805 Gross per 24 hour  Intake 929.87 ml  Output --  Net 929.87 ml   Filed Weights   09/02/20 1039  Weight: (!) 137.9 kg    Examination:  General exam: Appears calm and comfortable, obese, on RA Respiratory system: Clear to auscultation. Respiratory effort normal. Cardiovascular system: S1 & S2 heard, RRR. No JVD, murmurs, rubs, gallops or  clicks. Gastrointestinal system: Abdomen is nondistended, soft and nontender. No organomegaly or masses felt. Normal bowel sounds heard. Central nervous system: Alert and oriented. No focal neurological deficits. Extremities: Right lower extremity swollen and erythematous as compared to left leg.  Nontender on palpation. Skin: No rashes, lesions or ulcers Psychiatry: Judgement and insight appear normal. Mood & affect appropriate.    Data Reviewed: I have personally reviewed following labs and imaging studies  CBC: Recent Labs  Lab 09/02/20 1132 09/03/20 0506  WBC 7.9 8.7  NEUTROABS 5.7  --   HGB 12.0 12.0  HCT 37.5 38.0  MCV 81.2 80.9  PLT 222 423   Basic Metabolic Panel: Recent Labs  Lab 09/02/20 1132 09/03/20 0506  NA 138 137  K 4.1 4.2  CL 104 101  CO2 23 25  GLUCOSE 101* 114*  BUN 16 12  CREATININE 1.26* 1.19*  CALCIUM 9.0 8.9   GFR: Estimated Creatinine Clearance: 97 mL/min (A) (by C-G formula based on SCr of 1.19 mg/dL (H)). Liver Function Tests: No results for input(s): AST, ALT, ALKPHOS, BILITOT, PROT, ALBUMIN in the last 168 hours. No results for input(s): LIPASE, AMYLASE in the last 168 hours. No results for input(s): AMMONIA in the last 168 hours. Coagulation Profile: Recent Labs  Lab 09/02/20 1132  INR 1.9*   Cardiac Enzymes: No results for input(s): CKTOTAL, CKMB, CKMBINDEX, TROPONINI in the last 168 hours. BNP (last 3 results) No results for input(s): PROBNP in the last 8760 hours. HbA1C: No results for input(s): HGBA1C in the last 72 hours. CBG: No results for input(s): GLUCAP in the last 168 hours. Lipid Profile: No results for input(s): CHOL, HDL, LDLCALC, TRIG, CHOLHDL, LDLDIRECT in the last 72 hours. Thyroid Function Tests: No results for input(s): TSH, T4TOTAL, FREET4, T3FREE, THYROIDAB in the last 72 hours. Anemia Panel: No results for input(s): VITAMINB12, FOLATE, FERRITIN, TIBC, IRON, RETICCTPCT in the last 72 hours. Sepsis Labs: No  results for input(s): PROCALCITON, LATICACIDVEN in the last 168 hours.  Recent Results (from the past 240 hour(s))  Resp Panel by RT-PCR (Flu A&B, Covid) Nasopharyngeal Swab     Status: None   Collection Time: 09/02/20  1:45 PM   Specimen: Nasopharyngeal Swab; Nasopharyngeal(NP) swabs in vial transport medium  Result Value Ref Range Status   SARS Coronavirus 2 by RT PCR NEGATIVE NEGATIVE Final    Comment: (NOTE) SARS-CoV-2 target nucleic acids are NOT DETECTED.  The SARS-CoV-2 RNA is generally detectable in upper respiratory specimens during the acute phase of infection. The lowest concentration of SARS-CoV-2 viral copies this assay can detect is 138 copies/mL. A negative result does not preclude SARS-Cov-2 infection and should not be used as the sole basis for treatment or other patient management decisions. A negative result may occur with  improper specimen collection/handling, submission of specimen other than nasopharyngeal swab, presence of viral mutation(s) within the areas targeted by this assay, and inadequate number of viral copies(<138 copies/mL). A negative result must be combined with clinical observations, patient history, and epidemiological information. The expected result is Negative.  Fact Sheet for  Patients:  EntrepreneurPulse.com.au  Fact Sheet for Healthcare Providers:  IncredibleEmployment.be  This test is no t yet approved or cleared by the [place] FDA and  has been authorized for detection and/or diagnosis of SARS-CoV-2 by FDA under an Emergency Use Authorization (EUA). This EUA will remain  in effect (meaning this test can be used) for the duration of the COVID-19 declaration under Section 564(b)(1) of the Act, 21 U.S.C.section 360bbb-3(b)(1), unless the authorization is terminated  or revoked sooner.       Influenza A by PCR NEGATIVE NEGATIVE Final   Influenza B by PCR NEGATIVE NEGATIVE Final    Comment:  (NOTE) The Xpert Xpress SARS-CoV-2/FLU/RSV plus assay is intended as an aid in the diagnosis of influenza from Nasopharyngeal swab specimens and should not be used as a sole basis for treatment. Nasal washings and aspirates are unacceptable for Xpert Xpress SARS-CoV-2/FLU/RSV testing.  Fact Sheet for Patients: EntrepreneurPulse.com.au  Fact Sheet for Healthcare Providers: IncredibleEmployment.be  This test is not yet approved or cleared by the [place] FDA and has been authorized for detection and/or diagnosis of SARS-CoV-2 by FDA under an Emergency Use Authorization (EUA). This EUA will remain in effect (meaning this test can be used) for the duration of the COVID-19 declaration under Section 564(b)(1) of the Act, 21 U.S.C. section 360bbb-3(b)(1), unless the authorization is terminated or revoked.  Performed at [facility], [address] [place], [place] [address]       Radiology Studies: US Venous Img Lower Right (DVT Study)  Result Date: 09/02/2020 CLINICAL DATA:  35 year old female with a history of prior right lower extremity DVT EXAM: RIGHT LOWER EXTREMITY VENOUS DOPPLER ULTRASOUND TECHNIQUE: Gray-scale sonography with graded compression, as well as color Doppler and duplex ultrasound were performed to evaluate the lower extremity deep venous systems from the level of the common femoral vein and including the common femoral, femoral, profunda femoral, popliteal and calf veins including the posterior tibial, peroneal and gastrocnemius veins when visible. The superficial great saphenous vein was also interrogated. Spectral Doppler was utilized to evaluate flow at rest and with distal augmentation maneuvers in the common femoral, femoral and popliteal veins. COMPARISON:  Study performed 08/31/2020, with the images available in the epic system FINDINGS: Contralateral Common Femoral Vein: Respiratory phasicity is normal and  symmetric with the symptomatic side. No evidence of thrombus. Normal compressibility. Common Femoral Vein: New thrombus of the right common femoral vein, extending cephalad from the femoral vein. This is partially occlusive, and does not appear to extend into the pelvis. Saphenofemoral Junction: No evidence of thrombus. Normal compressibility and flow on color Doppler imaging. Profunda Femoral Vein: No evidence of thrombus. Normal compressibility and flow on color Doppler imaging. Femoral Vein: Occlusive thrombus through the length of the femoral vein, without compressibility. As compared to the study dated 08/31/2020, there has been propagation of thrombus more cephalad through the proximal femoral vein into the common femoral vein. Popliteal Vein: Persisting occlusive thrombus of the popliteal vein. Calf Veins: Persisting occlusive thrombus of the tibial veins Superficial Great Saphenous Vein: No evidence of thrombus. Normal compressibility and flow on color Doppler imaging. Other Findings:  None. IMPRESSION: The right lower extremity duplex exam confirms there has been interval propagation of the previously diagnosed right lower extremity DVT, which now extends from the femoral vein into the right common femoral vein. There is no evidence on the current duplex of extension above the hip into the iliac vein. Duplex confirms complete occlusive DVT of the femoral  vein, and popliteal vein, with involvement of the tibial veins. Electronically Signed   By: Corrie Mckusick D.O.   On: 09/02/2020 13:05    Scheduled Meds:  labetalol  200 mg Oral BID   sertraline  50 mg Oral QHS   Continuous Infusions:  heparin 2,200 Units/hr (09/03/20 0800)     LOS: 0 days   Time spent: 35 minutes   Shavonte Zhao Loann Quill, MD [facility]  If 7PM-7AM, please contact night-coverage [email or web address] 09/03/2020, 9:35 AM  35

## 2020-09-03 NOTE — Progress Notes (Signed)
ANTICOAGULATION CONSULT NOTE - Follow Up Consult  Pharmacy Consult for heparin Indication: DVT   Labs: Recent Labs    09/02/20 1132 09/02/20 1354 09/02/20 2040 09/03/20 0506  HGB 12.0  --   --   --   HCT 37.5  --   --   --   PLT 222  --   --   --   APTT  --  38* 42* 52*  LABPROT 22.0*  --   --   --   INR 1.9*  --   --   --   HEPARINUNFRC  --  >1.10* >1.10*  --   CREATININE 1.26*  --   --  1.19*    Assessment: 35yo female subtherapeutic on heparin after rate change; no gtt issues or signs of bleeding per RN.  Goal of Therapy:  aPTT 66-102 seconds   Plan:  Will increase heparin gtt by 3 units/kg/hr to 2200 units/hr and check PTT in 6 hours.    Wynona Neat, PharmD, BCPS  09/03/2020,7:32 AM

## 2020-09-03 NOTE — TOC Benefit Eligibility Note (Signed)
Transition of Care [facility]) Benefit Eligibility Note    Patient Details  Name: Nancy Rivera MRN: [id number] Date of Birth: January 21, 1986   Medication/Dose: Alveda Reasons 20 MG DAILY  Covered?: Yes  Tier: 2 Drug  Prescription Coverage [facility]erred [facility]: [facility]  Spoke with Person/Company/Phone Number:: JOAN   @ [facility] RX # [phone]  Co-Pay: [employer]ie [employer]  Prior Approval: No  Deductible:  (NO DEDUCTIBLE WITH PLAN)       Memory Argue Phone Number: 09/03/2020, 1:40 PM

## 2020-09-03 NOTE — Progress Notes (Signed)
ANTICOAGULATION CONSULT NOTE - [place] for IV heparin>>Lovenox Indication: DVT  No Known Allergies  Patient Measurements: Height: 5\' 9"  (175.3 cm) Weight: (!) 137.9 kg (304 lb) IBW/kg (Calculated) : 66.2 Heparin Dosing Weight: 99.3kg  Vital Signs: Temp: 98.8 F (37.1 C) (06/10 1320) Temp Source: Oral (06/10 1320) BP: 142/81 (06/10 0804) Pulse Rate: 75 (06/10 1320)  Labs: Recent Labs    09/02/20 1132 09/02/20 1354 09/02/20 2040 09/03/20 0506  HGB 12.0  --   --  12.0  HCT 37.5  --   --  38.0  PLT 222  --   --  234  APTT  --  38* 42* 52*  LABPROT 22.0*  --   --   --   INR 1.9*  --   --   --   HEPARINUNFRC  --  >1.10* >1.10*  --   CREATININE 1.26*  --   --  1.19*     Estimated Creatinine Clearance: 97 mL/min (A) (by C-G formula based on SCr of 1.19 mg/dL (H)).   Medical History: Past Medical History:  Diagnosis Date   Anxiety    Hypertension    Obesity    Oligomenorrhea    PCOS (polycystic ovarian syndrome)    Assessment: 35yoF sent by PCP for evaluation for increased swelling of the R leg. Patient began estrogen-containing birth control approximately 2 weeks ago. She had swelling in R leg and outpt doppler revealed DVT. She was started on Xarelto (Eliquis initially but had a cost issue so switched agents). Pharmacy has been consulted for IV heparin dosing.   aPTT this evening is SUBtherapeutic (aPTT 42, goal of 66-102). Heparin level remains falsely elevated in setting of recent DOAC use. CBC wnl from AM labs. The patient is noted to be on her period but is not bleeding heavily than usual at this point.  Plan to change to PO xarelto in AM. D/w Dr. Doristine Bosworth and will use use Lovenox for now instead of IV heparin.   Goal of Therapy:  Anti-Xa 0.6-1 Monitor platelets by anticoagulation protocol: Yes   Plan:  Dc heparin Lovenox 140mg  SQ q12 F/u with transition to Xarelto in AM  Onnie Boer, PharmD, BCIDP, AAHIVP, CPP Infectious  Disease Pharmacist 09/03/2020 3:49 PM

## 2020-09-03 NOTE — Consult Note (Signed)
Chief Complaint: Worsening right lower extremity DVT to the common femoral  Supervising Physician: Jacqulynn Cadet  Patient Status: [facility] - In-pt  History of Present Illness: Nancy Rivera is a 35 y.o. female History of HTN, depression, morbid obesity. 2 months post partum via c-section. Diagnosed with right lower extremity DVT on 6.7.22. treated with 3 doses of eliquis and started on Xarelto on 6.9.22. Patient presented to the ED at [facility] with persistent lower extremity edema and numbness. Found to have worsening DVT. Doppler from 6.9.22 reads The right lower extremity duplex exam confirms there has been interval propagation of the previously diagnosed right lower extremity DVT, which now extends from the femoral vein into the right common femoral vein. There is no evidence on the current duplex of extension above the hip into the iliac vein. Patient was transferred to [facility] for further evaluation. [name] seen at bedside with her husband present. Heparin gtt has been started. Patient reporting improved condition with decrease in "numbness on the bottom of my feet" and less edema since the initiation of Heparin.  Past Medical History:  Diagnosis Date   Anxiety    Hypertension    Obesity    Oligomenorrhea    PCOS (polycystic ovarian syndrome)     Past Surgical History:  Procedure Laterality Date   Benign cyst removed from eyebrow as an infant     CESAREAN SECTION N/A 07/05/2020   Procedure: CESAREAN SECTION;  Surgeon: Everlene Farrier, MD;  Location: [facility];  Service: Obstetrics;  Laterality: N/A;   REMOVAL OF CYST  1985   FROM UNDER LEFT EYEBROW    Allergies: Patient has no known allergies.  Medications: Prior to Admission medications   Medication Sig Start Date End Date Taking? Authorizing Provider  acetaminophen (TYLENOL) 500 MG tablet Take 500-1,000 mg by mouth every 6 (six) hours as needed for mild pain or headache.   Yes [provider]  busPIRone  (BUSPAR) 7.5 MG tablet Take 7.5 mg by mouth 2 (two) times daily as needed (for anxiety).   Yes [provider]  labetalol (NORMODYNE) 200 MG tablet Take 1 tablet (200 mg total) by mouth 3 (three) times daily. Patient taking differently: Take 200 mg by mouth 2 (two) times daily. 07/08/20  Yes Everlene Farrier, MD  naproxen sodium (ALEVE) 220 MG tablet Take 220-440 mg by mouth 2 (two) times daily as needed (for mild pain or headaches).   Yes [provider]  Rivaroxaban Stater Pack, 15 mg and 20 mg, (XARELTO STARTER PACK) Take 15-20 mg by mouth See admin instructions. Follow package directions: Take one 15mg  tablet by mouth twice a day. On day 22, switch to one 20mg  tablet once a day. Take with food. 09/02/20  Yes [provider]  sertraline (ZOLOFT) 50 MG tablet Take 50 mg by mouth at bedtime.   Yes [provider]  Rivaroxaban (XARELTO) 15 MG TABS tablet Take 15 mg by mouth 2 (two) times daily with a meal. Patient not taking: Reported on 09/02/2020    [provider]  [place] Take by mouth See admin instructions. Patient not taking: Reported on 09/02/2020 09/01/20   [provider]     Family History  Problem Relation Age of Onset   Cancer Maternal Grandfather        LUNG   Cancer Paternal Grandfather        LYMPHOMA       Review of Systems: A 12 point ROS discussed and pertinent  positives are indicated in the HPI above.  All other systems are negative.  Review of Systems  Constitutional:  Negative for fatigue and fever.  HENT:  Negative for congestion.   Respiratory:  Negative for cough and shortness of breath.   Gastrointestinal:  Negative for abdominal pain, diarrhea, nausea and vomiting.  Musculoskeletal:  Positive for myalgias (right lower extremity pain and swelling).   Vital Signs: BP (!) 142/81 (BP Location: Left Arm)   Pulse 75   Temp 98.8 F (37.1 C) (Oral)   Resp 12   Ht 5\' 9"  (1.753 m)   Wt (!) 304 lb (137.9  kg)   LMP 11/01/2019 (Exact Date)   SpO2 100%   BMI 44.89 kg/m   Physical Exam Vitals and nursing note reviewed.  Constitutional:      Appearance: She is well-developed.  HENT:     Head: Normocephalic and atraumatic.  Eyes:     Conjunctiva/sclera: Conjunctivae normal.  Pulmonary:     Effort: Pulmonary effort is normal.  Musculoskeletal:     Cervical back: Normal range of motion.     Right lower leg: Edema (non pitting) present.  Neurological:     Mental Status: She is alert and oriented to person, place, and time.    Imaging: US Venous Img Lower Right (DVT Study)  Result Date: 09/02/2020 CLINICAL DATA:  35 year old female with a history of prior right lower extremity DVT EXAM: RIGHT LOWER EXTREMITY VENOUS DOPPLER ULTRASOUND TECHNIQUE: Gray-scale sonography with graded compression, as well as color Doppler and duplex ultrasound were performed to evaluate the lower extremity deep venous systems from the level of the common femoral vein and including the common femoral, femoral, profunda femoral, popliteal and calf veins including the posterior tibial, peroneal and gastrocnemius veins when visible. The superficial great saphenous vein was also interrogated. Spectral Doppler was utilized to evaluate flow at rest and with distal augmentation maneuvers in the common femoral, femoral and popliteal veins. COMPARISON:  Study performed 08/31/2020, with the images available in the epic system FINDINGS: Contralateral Common Femoral Vein: Respiratory phasicity is normal and symmetric with the symptomatic side. No evidence of thrombus. Normal compressibility. Common Femoral Vein: New thrombus of the right common femoral vein, extending cephalad from the femoral vein. This is partially occlusive, and does not appear to extend into the pelvis. Saphenofemoral Junction: No evidence of thrombus. Normal compressibility and flow on color Doppler imaging. Profunda Femoral Vein: No evidence of thrombus. Normal  compressibility and flow on color Doppler imaging. Femoral Vein: Occlusive thrombus through the length of the femoral vein, without compressibility. As compared to the study dated 08/31/2020, there has been propagation of thrombus more cephalad through the proximal femoral vein into the common femoral vein. Popliteal Vein: Persisting occlusive thrombus of the popliteal vein. Calf Veins: Persisting occlusive thrombus of the tibial veins Superficial Great Saphenous Vein: No evidence of thrombus. Normal compressibility and flow on color Doppler imaging. Other Findings:  None. IMPRESSION: The right lower extremity duplex exam confirms there has been interval propagation of the previously diagnosed right lower extremity DVT, which now extends from the femoral vein into the right common femoral vein. There is no evidence on the current duplex of extension above the hip into the iliac vein. Duplex confirms complete occlusive DVT of the femoral vein, and popliteal vein, with involvement of the tibial veins. Electronically Signed   By: Corrie Mckusick D.O.   On: 09/02/2020 13:05   VAS [place] LOWER EXTREMITY VENOUS (DVT)  Result Date: 08/31/2020  Lower Venous DVT Study Patient Name:  CASANDRA DALLAIRE  Date of Exam:   08/31/2020 Medical Rec #: [id number]       Accession #:    [id number] Date of Birth: [birth year]       Patient Gender: F Patient Age:   39Y Exam Location:  [facility] Procedure:      VAS [place] LOWER EXTREMITY VENOUS (DVT) Referring Phys: [id number] Inez Catalina --------------------------------------------------------------------------------  Indications: Pain, and Swelling.  Risk Factors: None identified. Limitations: Poor ultrasound/tissue interface and body habitus. Comparison Study: No prior studies. Performing Technologist: Oliver Hum RVT  Examination Guidelines: A complete evaluation includes B-mode imaging, spectral Doppler, color Doppler, and power Doppler as needed of all accessible portions of each  vessel. Bilateral testing is considered an integral part of a complete examination. Limited examinations for reoccurring indications may be performed as noted. The reflux portion of the exam is performed with the patient in reverse Trendelenburg.  +---------+---------------+---------+-----------+----------+-------------------+ RIGHT    CompressibilityPhasicitySpontaneityPropertiesThrombus Aging      +---------+---------------+---------+-----------+----------+-------------------+ CFV      Full           Yes      Yes                                      +---------+---------------+---------+-----------+----------+-------------------+ SFJ      Full                                                             +---------+---------------+---------+-----------+----------+-------------------+ FV Prox  Partial        Yes      Yes                  Acute               +---------+---------------+---------+-----------+----------+-------------------+ FV Mid   None           No       No                   Acute               +---------+---------------+---------+-----------+----------+-------------------+ FV DistalNone           No       No                   Acute               +---------+---------------+---------+-----------+----------+-------------------+ PFV      Full                                                             +---------+---------------+---------+-----------+----------+-------------------+ POP      None           No       No                   Acute               +---------+---------------+---------+-----------+----------+-------------------+ PTV  None                                         Acute               +---------+---------------+---------+-----------+----------+-------------------+ PERO                                                  Not well visualized +---------+---------------+---------+-----------+----------+-------------------+  Soleal   None                                         Acute               +---------+---------------+---------+-----------+----------+-------------------+ Gastroc  None                                         Acute               +---------+---------------+---------+-----------+----------+-------------------+   +----+---------------+---------+-----------+----------+--------------+ LEFTCompressibilityPhasicitySpontaneityPropertiesThrombus Aging +----+---------------+---------+-----------+----------+--------------+ CFV Full           Yes      Yes                                 +----+---------------+---------+-----------+----------+--------------+     Summary: RIGHT: - Findings consistent with acute deep vein thrombosis involving the right femoral vein, right popliteal vein, right posterior tibial veins, right soleal veins, and right gastrocnemius veins. - No cystic structure found in the popliteal fossa.  LEFT: - No evidence of common femoral vein obstruction.  *See table(s) above for measurements and observations. Electronically signed by Ruta Hinds MD on 08/31/2020 at 4:24:31 PM.    Final     Labs:  CBC: Recent Labs    07/05/20 1047 07/06/20 0528 09/02/20 1132 09/03/20 0506  WBC 10.1 12.8* 7.9 8.7  HGB 12.3 10.4* 12.0 12.0  HCT 37.8 30.9* 37.5 38.0  PLT 159 187 222 234    COAGS: Recent Labs    09/02/20 1132 09/02/20 1354 09/02/20 2040 09/03/20 0506  INR 1.9*  --   --   --   APTT  --  38* 42* 52*    BMP: Recent Labs    07/06/20 0528 07/07/20 0723 09/02/20 1132 09/03/20 0506  NA 136 138 138 137  K 4.0 4.1 4.1 4.2  CL 107 106 104 101  CO2 21* 25 23 25   GLUCOSE 91 85 101* 114*  BUN 10 16 16 12   CALCIUM 8.9 8.9 9.0 8.9  CREATININE 1.16* 1.33* 1.26* 1.19*  GFRNONAA >60 53* 56* >60    LIVER FUNCTION TESTS: Recent Labs    07/04/20 2007 07/05/20 0412 07/06/20 0528 07/07/20 0723  BILITOT 0.5 0.6 0.4 0.6  AST 17 19 67* 44*  ALT 19 19 24 24    ALKPHOS 77 74 57 62  PROT 6.1* 6.0* 4.9* 5.6*  ALBUMIN 2.9* 2.8* 2.5* 2.8*     Assessment and Plan:  35 y.o. female inpatient. History of HTN, depression morbid obesity. 2 months post partum via c-section. Diagnosed with right lower  extremity DVT on 6.7.22. treated with 3 doses of eliquis and started on Xarelto on 6.9.22. Patient presented to the ED at [facility] with persistent lower extremity edema and numbness. Found to have worsening DVT. Doppler from 6.9.22 reads The right lower extremity duplex exam confirms there has been interval propagation of the previously diagnosed right lower extremity DVT, which now extends from the femoral vein into the right common femoral vein. There is no evidence on the current duplex of extension above the hip into the iliac vein. Patient was transferred to [facility] for further evaluation. Ms Lutes seen at bedside with her husband present. Heparin gtt has been started. Patient reporting improved condition with decrease in "numbness on the bottom of my feet" and less edema since the initiation of Heparin.  Discussed with patient and her husband the different treatment options.  As the Patient's condition appears to be improving recommend observation at this time. Should Patient condition persist or worsen in the next 2 weeks. Or there is further propagation to the iliac then recommend contacting IR for further intervention.   Please call [phone] with any questions or concerns   Thank you for this interesting consult.  I greatly enjoyed meeting PIYA MESCH and look forward to participating in their care.  A copy of this report was sent to the requesting provider on this date.  Electronically Signed: Jacqualine Mau, NP 09/03/2020, 4:02 PM   I spent a total of  40 Minutes   in face to face in clinical consultation, greater than 50% of which was counseling/coordinating care for DVT evaluation

## 2020-09-04 DIAGNOSIS — I82411 Acute embolism and thrombosis of right femoral vein: Secondary | ICD-10-CM

## 2020-09-04 LAB — CBC
HCT: 37.5 % (ref 36.0–46.0)
Hemoglobin: 11.9 g/dL — ABNORMAL LOW (ref 12.0–15.0)
MCH: 25.6 pg — ABNORMAL LOW (ref 26.0–34.0)
MCHC: 31.7 g/dL (ref 30.0–36.0)
MCV: 80.8 fL (ref 80.0–100.0)
Platelets: 266 10*3/uL (ref 150–400)
RBC: 4.64 MIL/uL (ref 3.87–5.11)
RDW: 13 % (ref 11.5–15.5)
WBC: 8.6 10*3/uL (ref 4.0–10.5)
nRBC: 0 % (ref 0.0–0.2)

## 2020-09-04 NOTE — Discharge Instructions (Signed)
Nancy Rivera,  You were in the hospital for a right leg DVT. You can continue your Xarelto. Return for worsening symptoms. Recommendation for repeat ultrasound in 2 weeks.

## 2020-09-04 NOTE — Progress Notes (Signed)
Discharge instructions provided to patient. Medications, need for follow-up appointment, and signs and symptoms of concern reviewed. All questions answered, IV removed. Patient to be escorted home by her husband.  Gailen Shelter RN

## 2020-09-04 NOTE — Discharge Summary (Signed)
Physician Discharge Summary  Nancy Rivera GXQ:[id number] DOB: June 30, 1985 DOA: 09/02/2020  PCP: London Pepper, MD  Admit date: 09/02/2020 Discharge date: 09/04/2020  Admitted From: Home Disposition: Home  Recommendations for Outpatient Follow-up:  Follow up with PCP in 1 week Repeat LE venous duplex in 2 weeks Please follow up on the following pending results: None  Home Health: None Equipment/Devices: None  Discharge Condition: Stable CODE STATUS: Full code Diet recommendation: Regular   Brief/Interim Summary:  Admission HPI written by Eric [place] DO   HPI: Nancy Rivera is a 35 y.o. female with medical history significant of essential hypertension, depression who presented as a transfer from [facility] for continued lower extremity edema.  Patient recently diagnosed with right lower extremity DVT on 08/31/2020, initially had 3 doses of Eliquis followed by 1 dose of Xarelto this morning.  Patient is 2 months postpartum from C-section delivery and recently started on estrogen OCP 2 weeks ago.  Given continued edema and mild discomfort with ambulation, PCP directed patient back to the ED for further evaluation.  Patient denies headache, no dizziness, no chest pain, no palpitations, no shortness of breath, no abdominal pain, no significant difficulty ambulating, no paresthesias.   Hospital course:  Acute right LE DVT Provoked in setting of estrogen containing OCP. Repeat venous duplex was significant for slight propagation of the DVT now extending from femoral vein into the right common femoral vein. Patient was managed on Heparin IV initially which was transitioned to Lovenox subcutaneous injections secondary to subtherapeutic aPTT levels. Patient discharged on previously prescribed Xarelto.  Primary hypertension Continue labetalol  Depression Continue Buspar and Zoloft  CKD stage II Stable.  Morbid obesity Body mass index is 44.89 kg/m.   Discharge  Diagnoses:  Active Problems:   Acute deep vein thrombosis (DVT) (HCC)   HTN (hypertension)   Depression    Discharge Instructions   Allergies as of 09/04/2020   No Known Allergies      Medication List     TAKE these medications    acetaminophen 500 MG tablet Commonly known as: TYLENOL Take 500-1,000 mg by mouth every 6 (six) hours as needed for mild pain or headache.   busPIRone 7.5 MG tablet Commonly known as: BUSPAR Take 7.5 mg by mouth 2 (two) times daily as needed (for anxiety).   naproxen sodium 220 MG tablet Commonly known as: ALEVE Take 220-440 mg by mouth 2 (two) times daily as needed (for mild pain or headaches).   sertraline 50 MG tablet Commonly known as: ZOLOFT Take 50 mg by mouth at bedtime.   Xarelto Starter Pack Generic drug: Rivaroxaban Stater Pack (15 mg and 20 mg) Take 15-20 mg by mouth See admin instructions. Follow package directions: Take one 15mg  tablet by mouth twice a day. On day 22, switch to one 20mg  tablet once a day. Take with food. What changed: Another medication with the same name was removed. Continue taking this medication, and follow the directions you see here.       ASK your doctor about these medications    labetalol 200 MG tablet Commonly known as: NORMODYNE Take 1 tablet (200 mg total) by mouth 3 (three) times daily.        Follow-up Information     London Pepper, MD Follow up in 1 week(s).   Specialty: Family Medicine Why: For hospital follow-up Contact information: [address] [place] [id number] [phone]  No Known Allergies  Consultations: Interventional radiology   Procedures/Studies: US Venous Img Lower Right (DVT Study)  Result Date: 09/02/2020 CLINICAL DATA:  35 year old female with a history of prior right lower extremity DVT EXAM: RIGHT LOWER EXTREMITY VENOUS DOPPLER ULTRASOUND TECHNIQUE: Gray-scale sonography with graded compression, as well as  color Doppler and duplex ultrasound were performed to evaluate the lower extremity deep venous systems from the level of the common femoral vein and including the common femoral, femoral, profunda femoral, popliteal and calf veins including the posterior tibial, peroneal and gastrocnemius veins when visible. The superficial great saphenous vein was also interrogated. Spectral Doppler was utilized to evaluate flow at rest and with distal augmentation maneuvers in the common femoral, femoral and popliteal veins. COMPARISON:  Study performed 08/31/2020, with the images available in the epic system FINDINGS: Contralateral Common Femoral Vein: Respiratory phasicity is normal and symmetric with the symptomatic side. No evidence of thrombus. Normal compressibility. Common Femoral Vein: New thrombus of the right common femoral vein, extending cephalad from the femoral vein. This is partially occlusive, and does not appear to extend into the pelvis. Saphenofemoral Junction: No evidence of thrombus. Normal compressibility and flow on color Doppler imaging. Profunda Femoral Vein: No evidence of thrombus. Normal compressibility and flow on color Doppler imaging. Femoral Vein: Occlusive thrombus through the length of the femoral vein, without compressibility. As compared to the study dated 08/31/2020, there has been propagation of thrombus more cephalad through the proximal femoral vein into the common femoral vein. Popliteal Vein: Persisting occlusive thrombus of the popliteal vein. Calf Veins: Persisting occlusive thrombus of the tibial veins Superficial Great Saphenous Vein: No evidence of thrombus. Normal compressibility and flow on color Doppler imaging. Other Findings:  None. IMPRESSION: The right lower extremity duplex exam confirms there has been interval propagation of the previously diagnosed right lower extremity DVT, which now extends from the femoral vein into the right common femoral vein. There is no evidence on  the current duplex of extension above the hip into the iliac vein. Duplex confirms complete occlusive DVT of the femoral vein, and popliteal vein, with involvement of the tibial veins. Electronically Signed   By: Corrie Mckusick D.O.   On: 09/02/2020 13:05   VAS [place] LOWER EXTREMITY VENOUS (DVT)  Result Date: 08/31/2020  Lower Venous DVT Study Patient Name:  Nancy Rivera  Date of Exam:   08/31/2020 Medical Rec #: [id number]       Accession #:    [id number] Date of Birth: [birth year]       Patient Gender: F Patient Age:   55Y Exam Location:  [facility] Procedure:      VAS [place] LOWER EXTREMITY VENOUS (DVT) Referring Phys: [id number] Inez Catalina --------------------------------------------------------------------------------  Indications: Pain, and Swelling.  Risk Factors: None identified. Limitations: Poor ultrasound/tissue interface and body habitus. Comparison Study: No prior studies. Performing Technologist: Oliver Hum RVT  Examination Guidelines: A complete evaluation includes B-mode imaging, spectral Doppler, color Doppler, and power Doppler as needed of all accessible portions of each vessel. Bilateral testing is considered an integral part of a complete examination. Limited examinations for reoccurring indications may be performed as noted. The reflux portion of the exam is performed with the patient in reverse Trendelenburg.  +---------+---------------+---------+-----------+----------+-------------------+ RIGHT    CompressibilityPhasicitySpontaneityPropertiesThrombus Aging      +---------+---------------+---------+-----------+----------+-------------------+ CFV      Full           Yes      Yes                                      +---------+---------------+---------+-----------+----------+-------------------+  SFJ      Full                                                             +---------+---------------+---------+-----------+----------+-------------------+ FV Prox  Partial         Yes      Yes                  Acute               +---------+---------------+---------+-----------+----------+-------------------+ FV Mid   None           No       No                   Acute               +---------+---------------+---------+-----------+----------+-------------------+ FV DistalNone           No       No                   Acute               +---------+---------------+---------+-----------+----------+-------------------+ PFV      Full                                                             +---------+---------------+---------+-----------+----------+-------------------+ POP      None           No       No                   Acute               +---------+---------------+---------+-----------+----------+-------------------+ PTV      None                                         Acute               +---------+---------------+---------+-----------+----------+-------------------+ PERO                                                  Not well visualized +---------+---------------+---------+-----------+----------+-------------------+ Soleal   None                                         Acute               +---------+---------------+---------+-----------+----------+-------------------+ Gastroc  None                                         Acute               +---------+---------------+---------+-----------+----------+-------------------+   +----+---------------+---------+-----------+----------+--------------+ LEFTCompressibilityPhasicitySpontaneityPropertiesThrombus Aging +----+---------------+---------+-----------+----------+--------------+  CFV Full           Yes      Yes                                 +----+---------------+---------+-----------+----------+--------------+     Summary: RIGHT: - Findings consistent with acute deep vein thrombosis involving the right femoral vein, right popliteal vein, right posterior tibial  veins, right soleal veins, and right gastrocnemius veins. - No cystic structure found in the popliteal fossa.  LEFT: - No evidence of common femoral vein obstruction.  *See table(s) above for measurements and observations. Electronically signed by Ruta Hinds MD on 08/31/2020 at 4:24:31 PM.    Final       Subjective: Leg pain/numbness is improved. No other concerns today.  Discharge Exam: Vitals:   09/04/20 0409 09/04/20 0808  BP: 116/61 (!) 133/91  Pulse: 71 73  Resp: 20 18  Temp: 99.2 F (37.3 C) 98 F (36.7 C)  SpO2: 100% 96%   Vitals:   09/03/20 1915 09/03/20 2320 09/04/20 0409 09/04/20 0808  BP: (!) 148/85 (!) 107/56 116/61 (!) 133/91  Pulse: 69 77 71 73  Resp: 16 20 20 18   Temp: 99 F (37.2 C) 99 F (37.2 C) 99.2 F (37.3 C) 98 F (36.7 C)  TempSrc: Oral Oral Oral Oral  SpO2: 97% 96% 100% 96%  Weight:      Height:        General: Pt is alert, awake, not in acute distress Cardiovascular: RRR, S1/S2 +, no rubs, no gallops Respiratory: CTA bilaterally, no wheezing, no rhonchi Abdominal: Soft, NT, ND, bowel sounds + Extremities: right lower leg is slightly larger than left    The results of significant diagnostics from this hospitalization (including imaging, microbiology, ancillary and laboratory) are listed below for reference.     Microbiology: Recent Results (from the past 240 hour(s))  Resp Panel by RT-PCR (Flu A&B, Covid) Nasopharyngeal Swab     Status: None   Collection Time: 09/02/20  1:45 PM   Specimen: Nasopharyngeal Swab; Nasopharyngeal(NP) swabs in vial transport medium  Result Value Ref Range Status   SARS Coronavirus 2 by RT PCR NEGATIVE NEGATIVE Final    Comment: (NOTE) SARS-CoV-2 target nucleic acids are NOT DETECTED.  The SARS-CoV-2 RNA is generally detectable in upper respiratory specimens during the acute phase of infection. The lowest concentration of SARS-CoV-2 viral copies this assay can detect is 138 copies/mL. A negative result does  not preclude SARS-Cov-2 infection and should not be used as the sole basis for treatment or other patient management decisions. A negative result may occur with  improper specimen collection/handling, submission of specimen other than nasopharyngeal swab, presence of viral mutation(s) within the areas targeted by this assay, and inadequate number of viral copies(<138 copies/mL). A negative result must be combined with clinical observations, patient history, and epidemiological information. The expected result is Negative.  Fact Sheet for Patients:  EntrepreneurPulse.com.au  Fact Sheet for Healthcare Providers:  IncredibleEmployment.be  This test is no t yet approved or cleared by the [place] FDA and  has been authorized for detection and/or diagnosis of SARS-CoV-2 by FDA under an Emergency Use Authorization (EUA). This EUA will remain  in effect (meaning this test can be used) for the duration of the COVID-19 declaration under Section 564(b)(1) of the Act, 21 U.S.C.section 360bbb-3(b)(1), unless the authorization is terminated  or revoked sooner.  Influenza A by PCR NEGATIVE NEGATIVE Final   Influenza B by PCR NEGATIVE NEGATIVE Final    Comment: (NOTE) The Xpert Xpress SARS-CoV-2/FLU/RSV plus assay is intended as an aid in the diagnosis of influenza from Nasopharyngeal swab specimens and should not be used as a sole basis for treatment. Nasal washings and aspirates are unacceptable for Xpert Xpress SARS-CoV-2/FLU/RSV testing.  Fact Sheet for Patients: EntrepreneurPulse.com.au  Fact Sheet for Healthcare Providers: IncredibleEmployment.be  This test is not yet approved or cleared by the [place] FDA and has been authorized for detection and/or diagnosis of SARS-CoV-2 by FDA under an Emergency Use Authorization (EUA). This EUA will remain in effect (meaning this test can be used) for the  duration of the COVID-19 declaration under Section 564(b)(1) of the Act, 21 U.S.C. section 360bbb-3(b)(1), unless the authorization is terminated or revoked.  Performed at [facility], [address], [place], [place] [address]      Labs: BNP (last 3 results) No results for input(s): BNP in the last 8760 hours. Basic Metabolic Panel: Recent Labs  Lab 09/02/20 1132 09/03/20 0506  NA 138 137  K 4.1 4.2  CL 104 101  CO2 23 25  GLUCOSE 101* 114*  BUN 16 12  CREATININE 1.26* 1.19*  CALCIUM 9.0 8.9   Liver Function Tests: No results for input(s): AST, ALT, ALKPHOS, BILITOT, PROT, ALBUMIN in the last 168 hours. No results for input(s): LIPASE, AMYLASE in the last 168 hours. No results for input(s): AMMONIA in the last 168 hours. CBC: Recent Labs  Lab 09/02/20 1132 09/03/20 0506 09/04/20 0144  WBC 7.9 8.7 8.6  NEUTROABS 5.7  --   --   HGB 12.0 12.0 11.9*  HCT 37.5 38.0 37.5  MCV 81.2 80.9 80.8  PLT 222 234 266   Cardiac Enzymes: No results for input(s): CKTOTAL, CKMB, CKMBINDEX, TROPONINI in the last 168 hours. BNP: Invalid input(s): POCBNP CBG: No results for input(s): GLUCAP in the last 168 hours. D-Dimer No results for input(s): DDIMER in the last 72 hours. Hgb A1c No results for input(s): HGBA1C in the last 72 hours. Lipid Profile No results for input(s): CHOL, HDL, LDLCALC, TRIG, CHOLHDL, LDLDIRECT in the last 72 hours. Thyroid function studies No results for input(s): TSH, T4TOTAL, T3FREE, THYROIDAB in the last 72 hours.  Invalid input(s): FREET3 Anemia work up No results for input(s): VITAMINB12, FOLATE, FERRITIN, TIBC, IRON, RETICCTPCT in the last 72 hours. Urinalysis    Component Value Date/Time   COLORURINE YELLOW 07/04/2020 1950   APPEARANCEUR CLEAR 07/04/2020 1950   LABSPEC 1.006 07/04/2020 1950   PHURINE 6.0 07/04/2020 1950   GLUCOSEU NEGATIVE 07/04/2020 1950   HGBUR NEGATIVE 07/04/2020 1950   BILIRUBINUR NEGATIVE  07/04/2020 1950   KETONESUR NEGATIVE 07/04/2020 1950   PROTEINUR NEGATIVE 07/04/2020 1950   UROBILINOGEN 0.2 07/24/2013 1940   NITRITE NEGATIVE 07/04/2020 1950   LEUKOCYTESUR NEGATIVE 07/04/2020 1950   Sepsis Labs Invalid input(s): PROCALCITONIN,  WBC,  LACTICIDVEN Microbiology Recent Results (from the past 240 hour(s))  Resp Panel by RT-PCR (Flu A&B, Covid) Nasopharyngeal Swab     Status: None   Collection Time: 09/02/20  1:45 PM   Specimen: Nasopharyngeal Swab; Nasopharyngeal(NP) swabs in vial transport medium  Result Value Ref Range Status   SARS Coronavirus 2 by RT PCR NEGATIVE NEGATIVE Final    Comment: (NOTE) SARS-CoV-2 target nucleic acids are NOT DETECTED.  The SARS-CoV-2 RNA is generally detectable in upper respiratory specimens during the acute phase of infection. The lowest concentration  of SARS-CoV-2 viral copies this assay can detect is 138 copies/mL. A negative result does not preclude SARS-Cov-2 infection and should not be used as the sole basis for treatment or other patient management decisions. A negative result may occur with  improper specimen collection/handling, submission of specimen other than nasopharyngeal swab, presence of viral mutation(s) within the areas targeted by this assay, and inadequate number of viral copies(<138 copies/mL). A negative result must be combined with clinical observations, patient history, and epidemiological information. The expected result is Negative.  Fact Sheet for Patients:  EntrepreneurPulse.com.au  Fact Sheet for Healthcare Providers:  IncredibleEmployment.be  This test is no t yet approved or cleared by the [place] FDA and  has been authorized for detection and/or diagnosis of SARS-CoV-2 by FDA under an Emergency Use Authorization (EUA). This EUA will remain  in effect (meaning this test can be used) for the duration of the COVID-19 declaration under Section 564(b)(1) of the  Act, 21 U.S.C.section 360bbb-3(b)(1), unless the authorization is terminated  or revoked sooner.       Influenza A by PCR NEGATIVE NEGATIVE Final   Influenza B by PCR NEGATIVE NEGATIVE Final    Comment: (NOTE) The Xpert Xpress SARS-CoV-2/FLU/RSV plus assay is intended as an aid in the diagnosis of influenza from Nasopharyngeal swab specimens and should not be used as a sole basis for treatment. Nasal washings and aspirates are unacceptable for Xpert Xpress SARS-CoV-2/FLU/RSV testing.  Fact Sheet for Patients: EntrepreneurPulse.com.au  Fact Sheet for Healthcare Providers: IncredibleEmployment.be  This test is not yet approved or cleared by the [place] FDA and has been authorized for detection and/or diagnosis of SARS-CoV-2 by FDA under an Emergency Use Authorization (EUA). This EUA will remain in effect (meaning this test can be used) for the duration of the COVID-19 declaration under Section 564(b)(1) of the Act, 21 U.S.C. section 360bbb-3(b)(1), unless the authorization is terminated or revoked.  Performed at [facility], [address] [place], [place] [address]     SIGNED:   Cordelia Poche, MD [facility] 09/04/2020, 8:14 AM

## 2020-09-08 DIAGNOSIS — Z09 Encounter for follow-up examination after completed treatment for conditions other than malignant neoplasm: Secondary | ICD-10-CM | POA: Diagnosis not present

## 2020-09-08 DIAGNOSIS — I82409 Acute embolism and thrombosis of unspecified deep veins of unspecified lower extremity: Secondary | ICD-10-CM | POA: Diagnosis not present

## 2020-09-08 DIAGNOSIS — I10 Essential (primary) hypertension: Secondary | ICD-10-CM | POA: Diagnosis not present

## 2020-09-08 DIAGNOSIS — Z6841 Body Mass Index (BMI) 40.0 and over, adult: Secondary | ICD-10-CM | POA: Diagnosis not present

## 2020-09-08 DIAGNOSIS — I82401 Acute embolism and thrombosis of unspecified deep veins of right lower extremity: Secondary | ICD-10-CM | POA: Diagnosis not present

## 2020-09-22 ENCOUNTER — Other Ambulatory Visit: Payer: Self-pay | Admitting: Family Medicine

## 2020-09-22 DIAGNOSIS — Z09 Encounter for follow-up examination after completed treatment for conditions other than malignant neoplasm: Secondary | ICD-10-CM

## 2020-09-22 DIAGNOSIS — I824Y1 Acute embolism and thrombosis of unspecified deep veins of right proximal lower extremity: Secondary | ICD-10-CM

## 2020-09-22 DIAGNOSIS — I82401 Acute embolism and thrombosis of unspecified deep veins of right lower extremity: Secondary | ICD-10-CM | POA: Diagnosis not present

## 2020-09-23 DIAGNOSIS — I82401 Acute embolism and thrombosis of unspecified deep veins of right lower extremity: Secondary | ICD-10-CM | POA: Diagnosis not present

## 2020-10-01 ENCOUNTER — Other Ambulatory Visit (HOSPITAL_BASED_OUTPATIENT_CLINIC_OR_DEPARTMENT_OTHER): Payer: Self-pay

## 2020-10-01 ENCOUNTER — Emergency Department (HOSPITAL_BASED_OUTPATIENT_CLINIC_OR_DEPARTMENT_OTHER)
Admission: EM | Admit: 2020-10-01 | Discharge: 2020-10-01 | Disposition: A | Discharge status: Home / Self Care | Payer: BC Managed Care – PPO | Attending: Emergency Medicine | Admitting: Emergency Medicine

## 2020-10-01 ENCOUNTER — Encounter (HOSPITAL_BASED_OUTPATIENT_CLINIC_OR_DEPARTMENT_OTHER): Payer: Self-pay | Admitting: Emergency Medicine

## 2020-10-01 ENCOUNTER — Other Ambulatory Visit: Payer: Self-pay

## 2020-10-01 ENCOUNTER — Emergency Department (HOSPITAL_BASED_OUTPATIENT_CLINIC_OR_DEPARTMENT_OTHER): Payer: BC Managed Care – PPO

## 2020-10-01 DIAGNOSIS — Z87891 Personal history of nicotine dependence: Secondary | ICD-10-CM | POA: Diagnosis not present

## 2020-10-01 DIAGNOSIS — I10 Essential (primary) hypertension: Secondary | ICD-10-CM | POA: Diagnosis not present

## 2020-10-01 DIAGNOSIS — I82401 Acute embolism and thrombosis of unspecified deep veins of right lower extremity: Secondary | ICD-10-CM | POA: Diagnosis not present

## 2020-10-01 DIAGNOSIS — Z79899 Other long term (current) drug therapy: Secondary | ICD-10-CM | POA: Diagnosis not present

## 2020-10-01 DIAGNOSIS — R202 Paresthesia of skin: Secondary | ICD-10-CM | POA: Diagnosis not present

## 2020-10-01 DIAGNOSIS — I82411 Acute embolism and thrombosis of right femoral vein: Secondary | ICD-10-CM | POA: Diagnosis not present

## 2020-10-01 DIAGNOSIS — Z5321 Procedure and treatment not carried out due to patient leaving prior to being seen by health care provider: Secondary | ICD-10-CM | POA: Insufficient documentation

## 2020-10-01 DIAGNOSIS — Z7901 Long term (current) use of anticoagulants: Secondary | ICD-10-CM | POA: Insufficient documentation

## 2020-10-01 DIAGNOSIS — M7989 Other specified soft tissue disorders: Secondary | ICD-10-CM | POA: Diagnosis not present

## 2020-10-01 LAB — BASIC METABOLIC PANEL
Anion gap: 9 (ref 5–15)
BUN: 14 mg/dL (ref 6–20)
CO2: 25 mmol/L (ref 22–32)
Calcium: 9.3 mg/dL (ref 8.9–10.3)
Chloride: 105 mmol/L (ref 98–111)
Creatinine, Ser: 1.16 mg/dL — ABNORMAL HIGH (ref 0.44–1.00)
GFR, Estimated: >60 mL/min (ref >60)
Glucose, Bld: 97 mg/dL (ref 70–99)
Potassium: 4.2 mmol/L (ref 3.5–5.1)
Sodium: 139 mmol/L (ref 135–145)

## 2020-10-01 LAB — CBC WITH DIFFERENTIAL/PLATELET
Abs Immature Granulocytes: 0.02 10*3/uL (ref 0.00–0.07)
Basophils Absolute: 0.1 10*3/uL (ref 0.0–0.1)
Basophils Relative: 1 %
Eosinophils Absolute: 0.3 10*3/uL (ref 0.0–0.5)
Eosinophils Relative: 4 %
HCT: 39.3 % (ref 36.0–46.0)
Hemoglobin: 12.5 g/dL (ref 12.0–15.0)
Immature Granulocytes: 0 %
Lymphocytes Relative: 29 %
Lymphs Abs: 2.3 10*3/uL (ref 0.7–4.0)
MCH: 25.2 pg — ABNORMAL LOW (ref 26.0–34.0)
MCHC: 31.8 g/dL (ref 30.0–36.0)
MCV: 79.2 fL — ABNORMAL LOW (ref 80.0–100.0)
Monocytes Absolute: 0.4 10*3/uL (ref 0.1–1.0)
Monocytes Relative: 5 %
Neutro Abs: 4.7 10*3/uL (ref 1.7–7.7)
Neutrophils Relative %: 61 %
Platelets: 182 10*3/uL (ref 150–400)
RBC: 4.96 MIL/uL (ref 3.87–5.11)
RDW: 14.4 % (ref 11.5–15.5)
WBC: 7.7 10*3/uL (ref 4.0–10.5)
nRBC: 0 % (ref 0.0–0.2)

## 2020-10-01 MED ORDER — WARFARIN SODIUM 5 MG PO TABS
10.0000 mg | ORAL_TABLET | Freq: Once | ORAL | Status: AC
  Administered 2020-10-01: 10 mg via ORAL
  Filled 2020-10-01: qty 2

## 2020-10-01 MED ORDER — WARFARIN SODIUM 5 MG PO TABS
10.0000 mg | ORAL_TABLET | Freq: Every day | ORAL | 0 refills | Status: DC
  Filled 2020-10-01: qty 60, 30d supply, fill #0

## 2020-10-01 MED ORDER — ENOXAPARIN SODIUM 150 MG/ML IJ SOSY
140.0000 mg | PREFILLED_SYRINGE | Freq: Two times a day (BID) | INTRAMUSCULAR | Status: DC
  Administered 2020-10-01: 140 mg via SUBCUTANEOUS
  Filled 2020-10-01: qty 1

## 2020-10-01 MED ORDER — ENOXAPARIN SODIUM 150 MG/ML IJ SOSY
140.0000 mg | PREFILLED_SYRINGE | Freq: Two times a day (BID) | INTRAMUSCULAR | 1 refills | Status: DC
  Filled 2020-10-01: qty 10, 5d supply, fill #0

## 2020-10-01 NOTE — Discharge Instructions (Addendum)
Your doctor will need to follow the Coumadin dosing.  Follow-up with hematology for further work-up for the DVT that worsened while on Xarelto.    Information on my medicine - Coumadin   (Warfarin)  This medication education was reviewed with me or my healthcare representative as part of my discharge preparation.   Why was Coumadin prescribed for you? Coumadin was prescribed for you because you have a blood clot or a medical condition that can cause an increased risk of forming blood clots. Blood clots can cause serious health problems by blocking the flow of blood to the heart, lung, or brain. Coumadin can prevent harmful blood clots from forming. As a reminder your indication for Coumadin is:   Select from menu  What test will check on my response to Coumadin? While on Coumadin (warfarin) you will need to have an INR test regularly to ensure that your dose is keeping you in the desired range. The INR (international normalized ratio) number is calculated from the result of the laboratory test called prothrombin time (PT).  If an INR APPOINTMENT HAS NOT ALREADY BEEN MADE FOR YOU please schedule an appointment to have this lab work done by your health care provider within 7 days. Your INR goal is usually a number between:  2 to 3 or your provider may give you a more narrow range like 2-2.5.  Ask your health care provider during an office visit what your goal INR is.  What  do you need to  know  About  COUMADIN? Take Coumadin (warfarin) exactly as prescribed by your healthcare provider about the same time each day.  DO NOT stop taking without talking to the doctor who prescribed the medication.  Stopping without other blood clot prevention medication to take the place of Coumadin may increase your risk of developing a new clot or stroke.  Get refills before you run out.  What do you do if you miss a dose? If you miss a dose, take it as soon as you remember on the same day then continue your  regularly scheduled regimen the next day.  Do not take two doses of Coumadin at the same time.  Important Safety Information A possible side effect of Coumadin (Warfarin) is an increased risk of bleeding. You should call your healthcare provider right away if you experience any of the following: Bleeding from an injury or your nose that does not stop. Unusual colored urine (red or dark brown) or unusual colored stools (red or black). Unusual bruising for unknown reasons. A serious fall or if you hit your head (even if there is no bleeding).  Some foods or medicines interact with Coumadin (warfarin) and might alter your response to warfarin. To help avoid this: Eat a balanced diet, maintaining a consistent amount of Vitamin K. Notify your provider about major diet changes you plan to make. Avoid alcohol or limit your intake to 1 drink for women and 2 drinks for men per day. (1 drink is 5 oz. wine, 12 oz. beer, or 1.5 oz. liquor.)  Make sure that ANY health care provider who prescribes medication for you knows that you are taking Coumadin (warfarin).  Also make sure the healthcare provider who is monitoring your Coumadin knows when you have started a new medication including herbals and non-prescription products.  Coumadin (Warfarin)  Major Drug Interactions  Increased Warfarin Effect Decreased Warfarin Effect  Alcohol (large quantities) Antibiotics (esp. Septra/Bactrim, Flagyl, Cipro) Amiodarone (Cordarone) Aspirin (ASA) Cimetidine (Tagamet) Megestrol (Megace) NSAIDs (  ibuprofen, naproxen, etc.) Piroxicam (Feldene) Propafenone (Rythmol SR) Propranolol (Inderal) Isoniazid (INH) Posaconazole (Noxafil) Barbiturates (Phenobarbital) Carbamazepine (Tegretol) Chlordiazepoxide (Librium) Cholestyramine (Questran) Griseofulvin Oral Contraceptives Rifampin Sucralfate (Carafate) Vitamin K   Coumadin (Warfarin) Major Herbal Interactions  Increased Warfarin Effect Decreased Warfarin  Effect  Garlic Ginseng Ginkgo biloba Coenzyme Q10 Green tea St. John's wort    Coumadin (Warfarin) FOOD Interactions  Eat a consistent number of servings per week of foods HIGH in Vitamin K (1 serving =  cup)  Collards (cooked, or boiled & drained) Kale (cooked, or boiled & drained) Mustard greens (cooked, or boiled & drained) Parsley *serving size only =  cup Spinach (cooked, or boiled & drained) Swiss chard (cooked, or boiled & drained) Turnip greens (cooked, or boiled & drained)  Eat a consistent number of servings per week of foods MEDIUM-HIGH in Vitamin K (1 serving = 1 cup)  Asparagus (cooked, or boiled & drained) Broccoli (cooked, boiled & drained, or raw & chopped) Brussel sprouts (cooked, or boiled & drained) *serving size only =  cup Lettuce, raw (green leaf, endive, romaine) Spinach, raw Turnip greens, raw & chopped   These websites have more information on Coumadin (warfarin):  FailFactory.se; VeganReport.com.au;

## 2020-10-01 NOTE — Progress Notes (Signed)
ANTICOAGULATION CONSULT NOTE - Initial Consult  Pharmacy Consult for warfarin + lovenox Indication: DVT  No Known Allergies  Patient Measurements:   Heparin Dosing Weight:   Vital Signs: Temp: 99.2 F (37.3 C) (07/08 1023) BP: 134/81 (07/08 1521) Pulse Rate: 68 (07/08 1521)  Labs: Recent Labs    10/01/20 1112  HGB 12.5  HCT 39.3  PLT 182  CREATININE 1.16*    CrCl cannot be calculated (Unknown ideal weight.).   Medical History: Past Medical History:  Diagnosis Date   Anxiety    Hypertension    Obesity    Oligomenorrhea    PCOS (polycystic ovarian syndrome)    Assessment: 40 yof presented with worsening pain/swelling from recent DVT diagnosis. She has been on xarelto but now switching to warfarin with a lovenox bridge. Her last dose of xarelto was last night. No bleeding noted.   Goal of Therapy:  INR 2-3 Monitor platelets by anticoagulation protocol: Yes   Plan:  Discontinue xarelto Warfarin 10mg  PO x 1 - Recommend providing 5mg  tablets and giving her 10mg  PO daily until INR check with further recommendations based on result Lovenox 140mg  SQ Q12H - recommend providing prescription for a 5 day supply with INR follow-up on Monday or Tuesday next week Education provided  Emberlynn Riggan, Rande Lawman 10/01/2020,4:16 PM

## 2020-10-01 NOTE — ED Notes (Signed)
US bedside

## 2020-10-01 NOTE — ED Provider Notes (Signed)
  Physical Exam  BP 136/86   Pulse 70   Temp 99.2 F (37.3 C)   Resp 20   LMP 10/01/2020   SpO2 100%   Physical Exam  ED Course/Procedures   Clinical Course as of 10/01/20 1706  Fri Oct 01, 2020  1140 CBC is normal.  [CS]  1210 BMP normal.  [CS]  1257 [place] images and results reviewed, DVT improved from our most recent US but per her report has worsened from outpatient doppler done at [facility]. Will attempt to obtain those reports to confirm.  [CS]  4854 Medical Records from [place] have not been received in a timely fashion. I called and spoke directly with her provider who was able to read her [place] report which only showed DVT in popliteal vein on 6/30.  [CS]  1502 Spoke with the PA with Vascular who will review images and discuss with the on-call surgeon and call back with recommendations. [CS]  6270 Care of the patient signed out to Dr. Alvino Chapel at the change of shift.  [CS]    Clinical Course User Index [CS] Truddie Hidden, MD    Procedures  MDM  Received patient in signout.  Worsening DVT.  Had a DVT on the 30th that had improved compared to prior.  Discussed with Dr. Stanford Breed from vascular surgery.  Has since had improvement and then worsening on the Xarelto will change to Lovenox and Coumadin.  Pharmacy consulted and prescribed dosing.  Will discharge home.  Will need to get PCP to help with the dosing and also needs follow-up with hematology.  No definite need for vascular follow-up.  Will discharge home.       Davonna Belling, MD 10/01/20 [phone]

## 2020-10-01 NOTE — ED Provider Notes (Signed)
[facility] Provider Note  CSN: [id number] Arrival date & time: 10/01/20 1017    History Chief Complaint  Patient presents with   Leg Swelling    Nancy Rivera is a 35 y.o. female with history of extensive RLE DVT initially diagnosed about a month ago had progression of clot due to insurance not covering her Eliquis and taking a few days to get Xarelto. She was admitted on heparin drip for 2 days, seen by IR but no intervention recommended as she was improving and she was ultimately discharged to continue Xarelto. She was had improvement in swelling and had an outpatient Doppler at [facility] showing improved clot, only in popliteal by her report. In the last few days she reports swelling has begun worsening again. No pain, some tingling in foot which has been ongoing. No fever, chills or streaking. No missed doses of Xarelto.    Past Medical History:  Diagnosis Date   Anxiety    Hypertension    Obesity    Oligomenorrhea    PCOS (polycystic ovarian syndrome)     Past Surgical History:  Procedure Laterality Date   Benign cyst removed from eyebrow as an infant     CESAREAN SECTION N/A 07/05/2020   Procedure: CESAREAN SECTION;  Surgeon: Everlene Farrier, MD;  Location: [place] LD ORS;  Service: Obstetrics;  Laterality: N/A;   REMOVAL OF CYST  1985   FROM UNDER LEFT EYEBROW    Family History  Problem Relation Age of Onset   Cancer Maternal Grandfather        LUNG   Cancer Paternal Grandfather        LYMPHOMA    Social History   Tobacco Use   Smoking status: Former    Packs/day: 0.50    Pack years: 0.00    Types: Cigarettes   Smokeless tobacco: Never  Vaping Use   Vaping Use: Never used  Substance Use Topics   Alcohol use: No   Drug use: No     Home Medications Prior to Admission medications   Medication Sig Start Date End Date Taking? Authorizing Provider  acetaminophen (TYLENOL) 500 MG tablet Take 500-1,000 mg by mouth every 6 (six)  hours as needed for mild pain or headache.    [provider]  busPIRone (BUSPAR) 7.5 MG tablet Take 7.5 mg by mouth 2 (two) times daily as needed (for anxiety).    [provider]  labetalol (NORMODYNE) 200 MG tablet Take 1 tablet (200 mg total) by mouth 3 (three) times daily. Patient taking differently: Take 200 mg by mouth 2 (two) times daily. 07/08/20   Everlene Farrier, MD  naproxen sodium (ALEVE) 220 MG tablet Take 220-440 mg by mouth 2 (two) times daily as needed (for mild pain or headaches).    [provider]  Rivaroxaban Stater Pack, 15 mg and 20 mg, (XARELTO STARTER PACK) Take 15-20 mg by mouth See admin instructions. Follow package directions: Take one 15mg  tablet by mouth twice a day. On day 22, switch to one 20mg  tablet once a day. Take with food. 09/02/20   [provider]  sertraline (ZOLOFT) 50 MG tablet Take 50 mg by mouth at bedtime.    [provider]     Allergies    Patient has no known allergies.   Review of Systems   Review of Systems A comprehensive review of systems was completed and negative except as noted in HPI.     Physical Exam BP 134/81 (BP  Location: Right Arm)   Pulse 68   Temp 99.2 F (37.3 C)   Resp 18   LMP 10/01/2020   SpO2 98%   Physical Exam Vitals and nursing note reviewed.  Constitutional:      Appearance: Normal appearance.  HENT:     Head: Normocephalic and atraumatic.     Nose: Nose normal.     Mouth/Throat:     Mouth: Mucous membranes are moist.  Eyes:     Extraocular Movements: Extraocular movements intact.     Conjunctiva/sclera: Conjunctivae normal.  Cardiovascular:     Rate and Rhythm: Normal rate.  Pulmonary:     Effort: Pulmonary effort is normal.     Breath sounds: Normal breath sounds.  Abdominal:     General: Abdomen is flat.     Palpations: Abdomen is soft.     Tenderness: There is no abdominal tenderness.  Musculoskeletal:        General: Swelling present. Normal range  of motion.     Cervical back: Neck supple.     Right lower leg: Edema present.  Skin:    General: Skin is warm and dry.  Neurological:     General: No focal deficit present.     Mental Status: She is alert.  Psychiatric:        Mood and Affect: Mood normal.     ED Results / Procedures / Treatments   Labs (all labs ordered are listed, but only abnormal results are displayed) Labs Reviewed  BASIC METABOLIC PANEL - Abnormal; Notable for the following components:      Result Value   Creatinine, Ser 1.16 (*)    All other components within normal limits  CBC WITH DIFFERENTIAL/PLATELET - Abnormal; Notable for the following components:   MCV 79.2 (*)    MCH 25.2 (*)    All other components within normal limits    EKG None   Radiology US Venous Img Lower Right (DVT Study)  Result Date: 10/01/2020 CLINICAL DATA:  35 year old with history of right lower extremity DVT. Follow-up. Continued swelling with anticoagulation. EXAM: RIGHT LOWER EXTREMITY VENOUS DOPPLER ULTRASOUND TECHNIQUE: Gray-scale sonography with graded compression, as well as color Doppler and duplex ultrasound were performed to evaluate the lower extremity deep venous systems from the level of the common femoral vein and including the common femoral, femoral, profunda femoral, popliteal and calf veins including the posterior tibial, peroneal and gastrocnemius veins when visible. The superficial great saphenous vein was also interrogated. Spectral Doppler was utilized to evaluate flow at rest and with distal augmentation maneuvers in the common femoral, femoral and popliteal veins. COMPARISON:  09/02/2020 FINDINGS: Contralateral Common Femoral Vein: Respiratory phasicity is normal and symmetric with the symptomatic side. No evidence of thrombus. Normal compressibility. Common Femoral Vein: No evidence of thrombus. Normal compressibility and color Doppler flow. Thrombus has resolved from the prior examination. Saphenofemoral  Junction: No evidence of thrombus. Normal compressibility and flow on color Doppler imaging. Profunda Femoral Vein: No evidence of thrombus. Normal compressibility and flow on color Doppler imaging. Femoral Vein: Positive for thrombus. Persistent thrombus involving the proximal, mid and distal femoral vein. Femoral vein thrombus remains relatively occlusive with minimal color Doppler flow. Popliteal Vein: Positive for thrombus. Minimal color Doppler flow in the right popliteal vein. Calf Veins: Visualized right deep calf veins are patent without thrombus. Superficial Great Saphenous Vein: No evidence of thrombus. Normal compressibility. Other Findings:  None. IMPRESSION: Persistent deep venous thrombosis in the right lower extremity. There is nearly  occlusive thrombus still involving the right popliteal vein and right femoral vein. However, the thrombus in the right common femoral vein has resolved. Electronically Signed   By: Markus Daft M.D.   On: 10/01/2020 12:39    Procedures Procedures  Medications Ordered in the ED Medications - No data to display   MDM Rules/Calculators/A&P MDM Patient with known DVT, compliant with Xarelto had improvement and then worsening in recent days. No PE symptoms, vitals are normal. Will check basic labs and send for repeat US.   ED Course  I have reviewed the triage vital signs and the nursing notes.  Pertinent labs & imaging results that were available during my care of the patient were reviewed by me and considered in my medical decision making (see chart for details).  Clinical Course as of 10/01/20 1552  Fri Oct 01, 2020  1140 CBC is normal.  [CS]  1210 BMP normal.  [CS]  1257 [place] images and results reviewed, DVT improved from our most recent US but per her report has worsened from outpatient doppler done at [facility]. Will attempt to obtain those reports to confirm.  [CS]  7897 Medical Records from [place] have not been received in a timely fashion. I called  and spoke directly with her provider who was able to read her [place] report which only showed DVT in popliteal vein on 6/30.  [CS]  1502 Spoke with the PA with Vascular who will review images and discuss with the on-call surgeon and call back with recommendations. [CS]  8478 Care of the patient signed out to Dr. Alvino Chapel at the change of shift.  [CS]    Clinical Course User Index [CS] Truddie Hidden, MD    Final Clinical Impression(s) / ED Diagnoses Final diagnoses:  Deep vein thrombosis (DVT) of femoral vein of right lower extremity, unspecified chronicity [facility])    Rx / DC Orders ED Discharge Orders     None        Truddie Hidden, MD 10/01/20 1552

## 2020-10-01 NOTE — ED Triage Notes (Signed)
Diagnosed with DVT 1 month ago. She has been taking blood thinners. States the swelling and redness has not gotten any better and the bottom of her foot still feels asleep.

## 2020-10-05 DIAGNOSIS — Z7901 Long term (current) use of anticoagulants: Secondary | ICD-10-CM | POA: Diagnosis not present

## 2020-10-05 DIAGNOSIS — I82431 Acute embolism and thrombosis of right popliteal vein: Secondary | ICD-10-CM | POA: Diagnosis not present

## 2020-10-05 DIAGNOSIS — I82411 Acute embolism and thrombosis of right femoral vein: Secondary | ICD-10-CM | POA: Diagnosis not present

## 2020-10-06 ENCOUNTER — Other Ambulatory Visit: Payer: Self-pay | Admitting: *Deleted

## 2020-10-06 ENCOUNTER — Telehealth: Payer: Self-pay

## 2020-10-06 DIAGNOSIS — I82411 Acute embolism and thrombosis of right femoral vein: Secondary | ICD-10-CM

## 2020-10-08 DIAGNOSIS — Z7901 Long term (current) use of anticoagulants: Secondary | ICD-10-CM | POA: Diagnosis not present

## 2020-10-08 DIAGNOSIS — I82431 Acute embolism and thrombosis of right popliteal vein: Secondary | ICD-10-CM | POA: Diagnosis not present

## 2020-10-11 DIAGNOSIS — I10 Essential (primary) hypertension: Secondary | ICD-10-CM | POA: Diagnosis not present

## 2020-10-11 DIAGNOSIS — Z309 Encounter for contraceptive management, unspecified: Secondary | ICD-10-CM | POA: Diagnosis not present

## 2020-10-11 DIAGNOSIS — Z7901 Long term (current) use of anticoagulants: Secondary | ICD-10-CM | POA: Diagnosis not present

## 2020-10-11 DIAGNOSIS — Z6841 Body Mass Index (BMI) 40.0 and over, adult: Secondary | ICD-10-CM | POA: Diagnosis not present

## 2020-10-11 DIAGNOSIS — I82411 Acute embolism and thrombosis of right femoral vein: Secondary | ICD-10-CM | POA: Diagnosis not present

## 2020-10-13 DIAGNOSIS — Z7901 Long term (current) use of anticoagulants: Secondary | ICD-10-CM | POA: Diagnosis not present

## 2020-10-13 DIAGNOSIS — Z6841 Body Mass Index (BMI) 40.0 and over, adult: Secondary | ICD-10-CM | POA: Diagnosis not present

## 2020-10-13 DIAGNOSIS — I82431 Acute embolism and thrombosis of right popliteal vein: Secondary | ICD-10-CM | POA: Diagnosis not present

## 2020-10-13 DIAGNOSIS — I10 Essential (primary) hypertension: Secondary | ICD-10-CM | POA: Diagnosis not present

## 2020-10-14 DIAGNOSIS — Z7901 Long term (current) use of anticoagulants: Secondary | ICD-10-CM | POA: Diagnosis not present

## 2020-10-14 DIAGNOSIS — Z6841 Body Mass Index (BMI) 40.0 and over, adult: Secondary | ICD-10-CM | POA: Diagnosis not present

## 2020-10-14 DIAGNOSIS — I10 Essential (primary) hypertension: Secondary | ICD-10-CM | POA: Diagnosis not present

## 2020-10-14 DIAGNOSIS — I82411 Acute embolism and thrombosis of right femoral vein: Secondary | ICD-10-CM | POA: Diagnosis not present

## 2020-10-17 DIAGNOSIS — Z7901 Long term (current) use of anticoagulants: Secondary | ICD-10-CM | POA: Diagnosis not present

## 2020-10-17 DIAGNOSIS — I82411 Acute embolism and thrombosis of right femoral vein: Secondary | ICD-10-CM | POA: Diagnosis not present

## 2020-10-21 DIAGNOSIS — I82411 Acute embolism and thrombosis of right femoral vein: Secondary | ICD-10-CM | POA: Diagnosis not present

## 2020-10-21 DIAGNOSIS — Z7901 Long term (current) use of anticoagulants: Secondary | ICD-10-CM | POA: Diagnosis not present

## 2020-10-25 DIAGNOSIS — Z7901 Long term (current) use of anticoagulants: Secondary | ICD-10-CM | POA: Diagnosis not present

## 2020-10-25 DIAGNOSIS — I82411 Acute embolism and thrombosis of right femoral vein: Secondary | ICD-10-CM | POA: Diagnosis not present

## 2020-10-28 DIAGNOSIS — I10 Essential (primary) hypertension: Secondary | ICD-10-CM | POA: Diagnosis not present

## 2020-10-28 DIAGNOSIS — Z6841 Body Mass Index (BMI) 40.0 and over, adult: Secondary | ICD-10-CM | POA: Diagnosis not present

## 2020-10-28 DIAGNOSIS — I82411 Acute embolism and thrombosis of right femoral vein: Secondary | ICD-10-CM | POA: Diagnosis not present

## 2020-10-28 DIAGNOSIS — Z7901 Long term (current) use of anticoagulants: Secondary | ICD-10-CM | POA: Diagnosis not present

## 2020-11-01 ENCOUNTER — Other Ambulatory Visit: Payer: Self-pay | Admitting: Family

## 2020-11-01 DIAGNOSIS — I82411 Acute embolism and thrombosis of right femoral vein: Secondary | ICD-10-CM | POA: Diagnosis not present

## 2020-11-01 DIAGNOSIS — I10 Essential (primary) hypertension: Secondary | ICD-10-CM | POA: Diagnosis not present

## 2020-11-01 DIAGNOSIS — Z7901 Long term (current) use of anticoagulants: Secondary | ICD-10-CM | POA: Diagnosis not present

## 2020-11-01 DIAGNOSIS — D6859 Other primary thrombophilia: Secondary | ICD-10-CM

## 2020-11-01 DIAGNOSIS — Z6841 Body Mass Index (BMI) 40.0 and over, adult: Secondary | ICD-10-CM | POA: Diagnosis not present

## 2020-11-02 ENCOUNTER — Inpatient Hospital Stay (HOSPITAL_BASED_OUTPATIENT_CLINIC_OR_DEPARTMENT_OTHER): Payer: BC Managed Care – PPO | Admitting: Family

## 2020-11-02 ENCOUNTER — Inpatient Hospital Stay: Payer: BC Managed Care – PPO | Attending: Family

## 2020-11-02 ENCOUNTER — Encounter: Payer: Self-pay | Admitting: Family

## 2020-11-02 ENCOUNTER — Other Ambulatory Visit: Payer: Self-pay

## 2020-11-02 VITALS — BP 126/71 | HR 75 | Temp 97.9°F | Resp 18 | Ht 69.0 in | Wt 316.0 lb

## 2020-11-02 DIAGNOSIS — R072 Precordial pain: Secondary | ICD-10-CM | POA: Diagnosis not present

## 2020-11-02 DIAGNOSIS — Z7901 Long term (current) use of anticoagulants: Secondary | ICD-10-CM | POA: Insufficient documentation

## 2020-11-02 DIAGNOSIS — Z808 Family history of malignant neoplasm of other organs or systems: Secondary | ICD-10-CM | POA: Diagnosis not present

## 2020-11-02 DIAGNOSIS — Z86718 Personal history of other venous thrombosis and embolism: Secondary | ICD-10-CM | POA: Insufficient documentation

## 2020-11-02 DIAGNOSIS — I82411 Acute embolism and thrombosis of right femoral vein: Secondary | ICD-10-CM | POA: Insufficient documentation

## 2020-11-02 DIAGNOSIS — I82441 Acute embolism and thrombosis of right tibial vein: Secondary | ICD-10-CM | POA: Diagnosis not present

## 2020-11-02 DIAGNOSIS — D6859 Other primary thrombophilia: Secondary | ICD-10-CM

## 2020-11-02 DIAGNOSIS — Z79899 Other long term (current) drug therapy: Secondary | ICD-10-CM

## 2020-11-02 DIAGNOSIS — Z801 Family history of malignant neoplasm of trachea, bronchus and lung: Secondary | ICD-10-CM | POA: Insufficient documentation

## 2020-11-02 DIAGNOSIS — E282 Polycystic ovarian syndrome: Secondary | ICD-10-CM | POA: Diagnosis not present

## 2020-11-02 DIAGNOSIS — Z87891 Personal history of nicotine dependence: Secondary | ICD-10-CM

## 2020-11-02 LAB — CBC WITH DIFFERENTIAL (CANCER CENTER ONLY)
Abs Immature Granulocytes: 0.04 10*3/uL (ref 0.00–0.07)
Basophils Absolute: 0.1 10*3/uL (ref 0.0–0.1)
Basophils Relative: 1 %
Eosinophils Absolute: 0.3 10*3/uL (ref 0.0–0.5)
Eosinophils Relative: 4 %
HCT: 40.3 % (ref 36.0–46.0)
Hemoglobin: 12.8 g/dL (ref 12.0–15.0)
Immature Granulocytes: 1 %
Lymphocytes Relative: 31 %
Lymphs Abs: 2.5 10*3/uL (ref 0.7–4.0)
MCH: 24.5 pg — ABNORMAL LOW (ref 26.0–34.0)
MCHC: 31.8 g/dL (ref 30.0–36.0)
MCV: 77.2 fL — ABNORMAL LOW (ref 80.0–100.0)
Monocytes Absolute: 0.5 10*3/uL (ref 0.1–1.0)
Monocytes Relative: 6 %
Neutro Abs: 4.7 10*3/uL (ref 1.7–7.7)
Neutrophils Relative %: 57 %
Platelet Count: 217 10*3/uL (ref 150–400)
RBC: 5.22 MIL/uL — ABNORMAL HIGH (ref 3.87–5.11)
RDW: 14.6 % (ref 11.5–15.5)
WBC Count: 8 10*3/uL (ref 4.0–10.5)
nRBC: 0 % (ref 0.0–0.2)

## 2020-11-02 LAB — CMP (CANCER CENTER ONLY)
ALT: 29 U/L (ref 0–44)
AST: 20 U/L (ref 15–41)
Albumin: 4.1 g/dL (ref 3.5–5.0)
Alkaline Phosphatase: 71 U/L (ref 38–126)
Anion gap: 9 (ref 5–15)
BUN: 25 mg/dL — ABNORMAL HIGH (ref 6–20)
CO2: 25 mmol/L (ref 22–32)
Calcium: 9.5 mg/dL (ref 8.9–10.3)
Chloride: 105 mmol/L (ref 98–111)
Creatinine: 1.24 mg/dL — ABNORMAL HIGH (ref 0.44–1.00)
GFR, Estimated: 57 mL/min — ABNORMAL LOW (ref >60)
Glucose, Bld: 85 mg/dL (ref 70–99)
Potassium: 4.2 mmol/L (ref 3.5–5.1)
Sodium: 139 mmol/L (ref 135–145)
Total Bilirubin: 0.3 mg/dL (ref 0.3–1.2)
Total Protein: 6.5 g/dL (ref 6.5–8.1)

## 2020-11-02 LAB — LACTATE DEHYDROGENASE: LDH: 169 U/L (ref 98–192)

## 2020-11-02 NOTE — Progress Notes (Signed)
Hematology/Oncology Consultation   Name: Nancy Rivera      MRN: [id number]    Location: Room/bed info not found  Date: 11/02/2020 Time:3:10 PM   REFERRING PHYSICIAN: Davonna Belling, MD  REASON FOR CONSULT: Acute DVT of the femoral vein of the right lower extremity   DIAGNOSIS: Acute DVT of the femoral vein of the right lower extremity - progressed on Xarelto  HISTORY OF PRESENT ILLNESS: Nancy Rivera is a very pleasant 35 yo caucasian female with recent history of right lower extremity DVT that occurred 2 month after the birth of Nancy Rivera. She had recently started estrogen based birth control. She was initially diagnosed on 08/31/2020 and treated with Heparin IV before transitioning to Lovenox and going home on Xarelto. US showed acute DVT involving the right femoral, right popliteal, right posterior tibial, right soleal and right gastrocnemius veins.  US performed with Nancy PCP with Romelle Starcher showed DVT only effecting the popliteal vein. She had gone to the ED on 10/01/2020 with worsening pain and swelling. [place] repeated at that time showed a nearly occlusive thrombus effecting the right popliteal and right femoral veins. The right common femoral vein thrombus had resolved. This was discussed with the vascular specialist on call and it was felt appropriate to discontinue Xarelto and bridge with Lovenox prior to starting Coumadin.  She has no prior history of thrombotic event and no known family history of thrombotic event.  She is now on Coumadin and INR yesterday was therapeutic at 2. She has been having this check regularly with Nancy PCP and they have been managing Nancy dose.  She has some swelling still noted in the right lower extremity. She will try wearing a compression stocking and see if this helps reduce the fluid retention and also helps with the intermittent pain she has experienced.  No numbness or tingling in Nancy extremities.  No falls or syncope to report.  She denies injury or recent travel.   She has history of PCOS. Nancy cycles is currently regular with heavy flow.  She is now on a progesterone based contraception and husband had a vasectomy this morning.  She is not breast feeding.  No history of miscarriage.  Nancy Rivera was delivered 5 weeks early by emergency C-section. She states that she had gone to the hospital with early symptoms of preeclampsia including headaches, elevated BP and elevated creatinine. Prior to discharge fetal decels were noted and she was taken to the delivery room. She states that she had preeclampsia with Nancy Rivera (now 35 yo) as well and was induced at 37 weeks. She delivered Nancy vaginally.  She states that since the birth of Nancy Rivera she has had a mildly elevated Creatinine level. She states that all work up including nephrology has been negative.  No personal history of cancer.  Family history of cancer includes maternal grandfather - lung (smoker) and paternal grandfather - lymphoma.  No history of diabetes or thyroid disease.  No issue with frequent or recurrent infections. No fever, chills, n/v, cough, rash, dizziness, SOB, chest pain, palpitations, abdominal pain or changes in bowel or bladder habits at this time.  She occasionally has brief fleeting mid sternal chest pains.  No smoking (quit in 03/2019) or recreational drug use.  She has the occasional alcoholic beverage socially.  She has a good appetite and is doing Nancy best to stay well hydrated throughout the day.  Nancy weight is described as stable.  She does enjoy walking for exercise.  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   ROS: All other 10 point review of systems is negative.   PAST MEDICAL HISTORY:   Past Medical History:  Diagnosis Date   Anxiety    Hypertension    Obesity    Oligomenorrhea    PCOS (polycystic ovarian syndrome)     ALLERGIES: No Known Allergies    MEDICATIONS:  Current Outpatient Medications on File Prior to Visit  Medication Sig Dispense Refill    acetaminophen (TYLENOL) 500 MG tablet Take 500-1,000 mg by mouth every 6 (six) hours as needed for mild pain or headache.     busPIRone (BUSPAR) 7.5 MG tablet Take 7.5 mg by mouth 2 (two) times daily as needed (for anxiety).     enoxaparin (LOVENOX) 150 MG/ML injection Inject 0.94 mLs (140 mg total) into the skin every 12 (twelve) hours. 10 mL 1   labetalol (NORMODYNE) 200 MG tablet Take 1 tablet (200 mg total) by mouth 3 (three) times daily. (Patient taking differently: Take 200 mg by mouth 2 (two) times daily.) 90 tablet 1   naproxen sodium (ALEVE) 220 MG tablet Take 220-440 mg by mouth 2 (two) times daily as needed (for mild pain or headaches).     sertraline (ZOLOFT) 50 MG tablet Take 50 mg by mouth at bedtime.     warfarin (COUMADIN) 5 MG tablet Take 2 tablets (10 mg total) by mouth daily. 60 tablet 0   No current facility-administered medications on file prior to visit.     PAST SURGICAL HISTORY Past Surgical History:  Procedure Laterality Date   Benign cyst removed from eyebrow as an infant     CESAREAN SECTION N/A 07/05/2020   Procedure: CESAREAN SECTION;  Surgeon: Everlene Farrier, MD;  Location: [facility] LD ORS;  Service: Obstetrics;  Laterality: N/A;   REMOVAL OF CYST  1985   FROM UNDER LEFT EYEBROW    FAMILY HISTORY: Family History  Problem Relation Age of Onset   Cancer Maternal Grandfather        LUNG   Cancer Paternal Grandfather        LYMPHOMA    SOCIAL HISTORY:  reports that she has quit smoking. Nancy smoking use included cigarettes. She smoked an average of .5 packs per day. She has never used smokeless tobacco. She reports that she does not drink alcohol and does not use drugs.  PERFORMANCE STATUS: The patient's performance status is 1 - Symptomatic but completely ambulatory  PHYSICAL EXAM: Most Recent Vital Signs: unknown if currently breastfeeding. BP 126/71 (BP Location: Left Arm, Patient Position: Sitting)   Pulse 75   Temp 97.9 F (36.6 C) (Oral)   Resp 18    Ht '5\' 9"'$  (1.753 m)   Wt (!) 316 lb (143.3 kg)   SpO2 99%   BMI 46.67 kg/m   General Appearance:    Alert, cooperative, no distress, appears stated age  Head:    Normocephalic, without obvious abnormality, atraumatic  Eyes:    PERRL, conjunctiva/corneas clear, EOM's intact, fundi    benign, both eyes  Ears:    Normal TM's and external ear canals, both ears  Nose:   Nares normal, septum midline, mucosa normal, no drainage    or sinus tenderness  Throat:   Lips, mucosa, and tongue normal; teeth and gums normal  Neck:   Supple, symmetrical, trachea midline, no adenopathy;    thyroid:  no enlargement/tenderness/nodules; no carotid   bruit or JVD  Back:     Symmetric, no curvature, ROM  normal, no CVA tenderness  Lungs:     Clear to auscultation bilaterally, respirations unlabored  Chest Wall:    No tenderness or deformity   Heart:    Regular rate and rhythm, S1 and S2 normal, no murmur, rub   or gallop  Breast Exam:    No tenderness, masses, or nipple abnormality  Abdomen:     Soft, non-tender, bowel sounds active all four quadrants,    no masses, no organomegaly  Genitalia:    Normal female without lesion, discharge or tenderness  Rectal:    Normal tone, normal prostate, no masses or tenderness;   guaiac negative stool  Extremities:   Extremities normal, atraumatic, no cyanosis or edema  Pulses:   2+ and symmetric all extremities  Skin:   Skin color, texture, turgor normal, no rashes or lesions  Lymph nodes:   Cervical, supraclavicular, and axillary nodes normal  Neurologic:   CNII-XII intact, normal strength, sensation and reflexes    throughout    LABORATORY DATA:  Results for orders placed or performed in visit on 11/02/20 (from the past 48 hour(s))  CBC with Differential ([facility] Only)     Status: Abnormal   Collection Time: 11/02/20  2:55 PM  Result Value Ref Range   WBC Count 8.0 4.0 - 10.5 K/uL   RBC 5.22 (H) 3.87 - 5.11 MIL/uL   Hemoglobin 12.8 12.0 - 15.0 g/dL    HCT 40.3 36.0 - 46.0 %   MCV 77.2 (L) 80.0 - 100.0 fL   MCH 24.5 (L) 26.0 - 34.0 pg   MCHC 31.8 30.0 - 36.0 g/dL   RDW 14.6 11.5 - 15.5 %   Platelet Count 217 150 - 400 K/uL   nRBC 0.0 0.0 - 0.2 %   Neutrophils Relative % 57 %   Neutro Abs 4.7 1.7 - 7.7 K/uL   Lymphocytes Relative 31 %   Lymphs Abs 2.5 0.7 - 4.0 K/uL   Monocytes Relative 6 %   Monocytes Absolute 0.5 0.1 - 1.0 K/uL   Eosinophils Relative 4 %   Eosinophils Absolute 0.3 0.0 - 0.5 K/uL   Basophils Relative 1 %   Basophils Absolute 0.1 0.0 - 0.1 K/uL   Immature Granulocytes 1 %   Abs Immature Granulocytes 0.04 0.00 - 0.07 K/uL    Comment: Performed at [facility] at [facility], [address] [place], [place] [address]      RADIOGRAPHY: No results found.     PATHOLOGY: None   ASSESSMENT/PLAN: Ms. Garrity is a very pleasant 35 yo caucasian female with recent history of right lower extremity DVT that progressed while on Xarelto. No prior history.  She is currently on Coumadin and tolerating well.  She continues to follow-up with PCP for INR check and medication management.  We did a partial hyper coag panel to further assess for possible genetic cause for increased risk for thrombotic event. Results are pending.  She has changed to a progesterone based contraceptive.  She will continue to properly hydrate and try wearing Nancy compression stocking for added support during the day.  We will plan to see Nancy back in 8 weeks for repeat US, lab and follow-up.   All questions were answered. The patient knows to call the clinic with any problems, questions or concerns. We can certainly see the patient much sooner if necessary.  The patient was discussed with Dr. Marin Olp and he is in agreement with the aforementioned.   Judson Roch  [place], NP

## 2020-11-03 DIAGNOSIS — L255 Unspecified contact dermatitis due to plants, except food: Secondary | ICD-10-CM | POA: Diagnosis not present

## 2020-11-03 DIAGNOSIS — Z6841 Body Mass Index (BMI) 40.0 and over, adult: Secondary | ICD-10-CM | POA: Diagnosis not present

## 2020-11-03 LAB — HOMOCYSTEINE: Homocysteine: 11.8 umol/L (ref 0.0–14.5)

## 2020-11-04 LAB — CARDIOLIPIN ANTIBODIES, IGG, IGM, IGA
Anticardiolipin IgA: <9 APL U/mL (ref 0–11)
Anticardiolipin IgG: <9 GPL U/mL (ref 0–14)
Anticardiolipin IgM: <9 MPL U/mL (ref 0–12)

## 2020-11-04 LAB — BETA-2-GLYCOPROTEIN I ABS, IGG/M/A
Beta-2 Glyco I IgG: <9 GPI IgG units (ref 0–20)
Beta-2-Glycoprotein I IgA: <9 GPI IgA units (ref 0–25)
Beta-2-Glycoprotein I IgM: <9 GPI IgM units (ref 0–32)

## 2020-11-05 ENCOUNTER — Telehealth: Payer: Self-pay | Admitting: *Deleted

## 2020-11-05 DIAGNOSIS — Z6841 Body Mass Index (BMI) 40.0 and over, adult: Secondary | ICD-10-CM | POA: Diagnosis not present

## 2020-11-05 DIAGNOSIS — I10 Essential (primary) hypertension: Secondary | ICD-10-CM | POA: Diagnosis not present

## 2020-11-05 DIAGNOSIS — Z7901 Long term (current) use of anticoagulants: Secondary | ICD-10-CM | POA: Diagnosis not present

## 2020-11-05 DIAGNOSIS — I82511 Chronic embolism and thrombosis of right femoral vein: Secondary | ICD-10-CM | POA: Diagnosis not present

## 2020-11-05 LAB — DRVVT CONFIRM: dRVVT Confirm: 1.9 ratio — ABNORMAL HIGH (ref 0.8–1.2)

## 2020-11-05 LAB — LUPUS ANTICOAGULANT PANEL
DRVVT: 93.2 s — ABNORMAL HIGH (ref 0.0–47.0)
PTT Lupus Anticoagulant: 45.5 s (ref 0.0–51.9)

## 2020-11-05 LAB — DRVVT MIX: dRVVT Mix: 54.6 s — ABNORMAL HIGH (ref 0.0–40.4)

## 2020-11-05 NOTE — Telephone Encounter (Signed)
Per 11/02/20 los called and gave upcoming appointments  - confirmed

## 2020-11-08 ENCOUNTER — Telehealth: Payer: Self-pay | Admitting: Family

## 2020-11-08 DIAGNOSIS — R760 Raised antibody titer: Secondary | ICD-10-CM

## 2020-11-08 DIAGNOSIS — I82511 Chronic embolism and thrombosis of right femoral vein: Secondary | ICD-10-CM | POA: Diagnosis not present

## 2020-11-08 DIAGNOSIS — Z7901 Long term (current) use of anticoagulants: Secondary | ICD-10-CM | POA: Diagnosis not present

## 2020-11-08 DIAGNOSIS — D6859 Other primary thrombophilia: Secondary | ICD-10-CM

## 2020-11-08 DIAGNOSIS — I82411 Acute embolism and thrombosis of right femoral vein: Secondary | ICD-10-CM

## 2020-11-08 LAB — FACTOR 5 LEIDEN

## 2020-11-08 LAB — PROTHROMBIN GENE MUTATION

## 2020-11-08 NOTE — Telephone Encounter (Signed)
We were able to go over her hyper coag lab work from last week. She was heterozygous for the prothrombin gene mutation as well as positive for the lupus anticoagulant.  She will continue her same regimen with Coumadin and will continue to have her INR regularly checked and medication adjusted by her PCP.  We will see her again in 2 months for follow-up and repeat US at that time. No questions at this time. Patient appreciative of call.

## 2020-11-11 DIAGNOSIS — F32A Depression, unspecified: Secondary | ICD-10-CM | POA: Diagnosis not present

## 2020-11-11 DIAGNOSIS — Z6841 Body Mass Index (BMI) 40.0 and over, adult: Secondary | ICD-10-CM | POA: Diagnosis not present

## 2020-11-11 DIAGNOSIS — Z7901 Long term (current) use of anticoagulants: Secondary | ICD-10-CM | POA: Diagnosis not present

## 2020-11-11 DIAGNOSIS — Z8371 Family history of colonic polyps: Secondary | ICD-10-CM | POA: Diagnosis not present

## 2020-11-11 DIAGNOSIS — I10 Essential (primary) hypertension: Secondary | ICD-10-CM | POA: Diagnosis not present

## 2020-11-11 DIAGNOSIS — I82511 Chronic embolism and thrombosis of right femoral vein: Secondary | ICD-10-CM | POA: Diagnosis not present

## 2020-11-18 DIAGNOSIS — I10 Essential (primary) hypertension: Secondary | ICD-10-CM | POA: Diagnosis not present

## 2020-11-18 DIAGNOSIS — I82511 Chronic embolism and thrombosis of right femoral vein: Secondary | ICD-10-CM | POA: Diagnosis not present

## 2020-11-18 DIAGNOSIS — Z7901 Long term (current) use of anticoagulants: Secondary | ICD-10-CM | POA: Diagnosis not present

## 2020-11-18 DIAGNOSIS — Z6841 Body Mass Index (BMI) 40.0 and over, adult: Secondary | ICD-10-CM | POA: Diagnosis not present

## 2020-11-18 DIAGNOSIS — Z5181 Encounter for therapeutic drug level monitoring: Secondary | ICD-10-CM | POA: Diagnosis not present

## 2020-11-25 DIAGNOSIS — Z5181 Encounter for therapeutic drug level monitoring: Secondary | ICD-10-CM | POA: Diagnosis not present

## 2020-11-25 DIAGNOSIS — Z6841 Body Mass Index (BMI) 40.0 and over, adult: Secondary | ICD-10-CM | POA: Diagnosis not present

## 2020-11-25 DIAGNOSIS — I10 Essential (primary) hypertension: Secondary | ICD-10-CM | POA: Diagnosis not present

## 2020-11-25 DIAGNOSIS — Z7901 Long term (current) use of anticoagulants: Secondary | ICD-10-CM | POA: Diagnosis not present

## 2020-11-25 DIAGNOSIS — I82511 Chronic embolism and thrombosis of right femoral vein: Secondary | ICD-10-CM | POA: Diagnosis not present

## 2020-12-02 DIAGNOSIS — Z7901 Long term (current) use of anticoagulants: Secondary | ICD-10-CM | POA: Diagnosis not present

## 2020-12-02 DIAGNOSIS — I82511 Chronic embolism and thrombosis of right femoral vein: Secondary | ICD-10-CM | POA: Diagnosis not present

## 2020-12-11 DIAGNOSIS — Z5181 Encounter for therapeutic drug level monitoring: Secondary | ICD-10-CM | POA: Diagnosis not present

## 2020-12-11 DIAGNOSIS — Z7901 Long term (current) use of anticoagulants: Secondary | ICD-10-CM | POA: Diagnosis not present

## 2020-12-11 DIAGNOSIS — I82511 Chronic embolism and thrombosis of right femoral vein: Secondary | ICD-10-CM | POA: Diagnosis not present

## 2020-12-13 ENCOUNTER — Telehealth: Payer: Self-pay | Admitting: *Deleted

## 2020-12-13 NOTE — Telephone Encounter (Signed)
Nancie Neas, NP, from [facility] called asking to speak to Judson Roch [place],NP. Informed her that Judson Roch was out of the office until Friday. Tawanna Solo would like Judson Roch to call her. This is in regards to patient's INR results.

## 2020-12-17 ENCOUNTER — Other Ambulatory Visit (HOSPITAL_COMMUNITY): Payer: Self-pay

## 2020-12-17 ENCOUNTER — Other Ambulatory Visit: Payer: Self-pay | Admitting: Family

## 2020-12-17 DIAGNOSIS — I82411 Acute embolism and thrombosis of right femoral vein: Secondary | ICD-10-CM

## 2020-12-17 DIAGNOSIS — D6852 Prothrombin gene mutation: Secondary | ICD-10-CM

## 2020-12-17 DIAGNOSIS — R760 Raised antibody titer: Secondary | ICD-10-CM

## 2020-12-17 DIAGNOSIS — D6859 Other primary thrombophilia: Secondary | ICD-10-CM

## 2020-12-17 MED ORDER — APIXABAN 5 MG PO TABS: 5.0000 mg | ORAL_TABLET | Freq: Two times a day (BID) | ORAL | 3 refills | Status: DC

## 2020-12-17 NOTE — Progress Notes (Signed)
Spoke with MD regarding recent issues with high INR (9) on coumadin. We will change her now to Eliquis 5 mg PO BID. She is also concerned about cost and I have reached out to our financial counselor to help see if there is any assistance available to her. If the cost is an issue, we will switch her Lovenox BID. Have also spoken with her PCP Nancie Neas, NP and updated her on these changes.  Patient verbalized understanding and agreement with the plan. No questions at this time. She was appreciative of the call.   Of note, patient is not breastfeeding.

## 2020-12-28 ENCOUNTER — Telehealth: Payer: Self-pay | Admitting: *Deleted

## 2020-12-28 ENCOUNTER — Encounter: Payer: Self-pay | Admitting: Family

## 2020-12-28 ENCOUNTER — Other Ambulatory Visit: Payer: Self-pay

## 2020-12-28 ENCOUNTER — Inpatient Hospital Stay: Payer: BC Managed Care – PPO | Attending: Family

## 2020-12-28 ENCOUNTER — Ambulatory Visit (HOSPITAL_BASED_OUTPATIENT_CLINIC_OR_DEPARTMENT_OTHER)
Admission: RE | Admit: 2020-12-28 | Discharge: 2020-12-28 | Disposition: A | Discharge status: Home / Self Care | Payer: BC Managed Care – PPO | Source: Ambulatory Visit | Attending: Family | Admitting: Family

## 2020-12-28 ENCOUNTER — Inpatient Hospital Stay (HOSPITAL_BASED_OUTPATIENT_CLINIC_OR_DEPARTMENT_OTHER): Payer: BC Managed Care – PPO | Admitting: Family

## 2020-12-28 VITALS — BP 134/80 | HR 66 | Temp 98.8°F | Resp 18 | Wt 324.8 lb

## 2020-12-28 DIAGNOSIS — D6862 Lupus anticoagulant syndrome: Secondary | ICD-10-CM | POA: Insufficient documentation

## 2020-12-28 DIAGNOSIS — I82411 Acute embolism and thrombosis of right femoral vein: Secondary | ICD-10-CM

## 2020-12-28 DIAGNOSIS — Z79899 Other long term (current) drug therapy: Secondary | ICD-10-CM | POA: Insufficient documentation

## 2020-12-28 DIAGNOSIS — D6859 Other primary thrombophilia: Secondary | ICD-10-CM | POA: Insufficient documentation

## 2020-12-28 DIAGNOSIS — I82531 Chronic embolism and thrombosis of right popliteal vein: Secondary | ICD-10-CM | POA: Diagnosis not present

## 2020-12-28 DIAGNOSIS — Z7901 Long term (current) use of anticoagulants: Secondary | ICD-10-CM | POA: Insufficient documentation

## 2020-12-28 DIAGNOSIS — I82511 Chronic embolism and thrombosis of right femoral vein: Secondary | ICD-10-CM | POA: Diagnosis not present

## 2020-12-28 DIAGNOSIS — R760 Raised antibody titer: Secondary | ICD-10-CM

## 2020-12-28 DIAGNOSIS — S82001A Unspecified fracture of right patella, initial encounter for closed fracture: Secondary | ICD-10-CM | POA: Insufficient documentation

## 2020-12-28 DIAGNOSIS — I82401 Acute embolism and thrombosis of unspecified deep veins of right lower extremity: Secondary | ICD-10-CM | POA: Diagnosis not present

## 2020-12-28 DIAGNOSIS — S82891A Other fracture of right lower leg, initial encounter for closed fracture: Secondary | ICD-10-CM | POA: Diagnosis not present

## 2020-12-28 DIAGNOSIS — D6852 Prothrombin gene mutation: Secondary | ICD-10-CM | POA: Diagnosis not present

## 2020-12-28 LAB — CMP (CANCER CENTER ONLY)
ALT: 16 U/L (ref 0–44)
AST: 14 U/L — ABNORMAL LOW (ref 15–41)
Albumin: 4.2 g/dL (ref 3.5–5.0)
Alkaline Phosphatase: 66 U/L (ref 38–126)
Anion gap: 8 (ref 5–15)
BUN: 22 mg/dL — ABNORMAL HIGH (ref 6–20)
CO2: 27 mmol/L (ref 22–32)
Calcium: 9.7 mg/dL (ref 8.9–10.3)
Chloride: 103 mmol/L (ref 98–111)
Creatinine: 1.26 mg/dL — ABNORMAL HIGH (ref 0.44–1.00)
GFR, Estimated: 56 mL/min — ABNORMAL LOW (ref >60)
Glucose, Bld: 93 mg/dL (ref 70–99)
Potassium: 4.8 mmol/L (ref 3.5–5.1)
Sodium: 138 mmol/L (ref 135–145)
Total Bilirubin: 0.5 mg/dL (ref 0.3–1.2)
Total Protein: 6.7 g/dL (ref 6.5–8.1)

## 2020-12-28 LAB — CBC WITH DIFFERENTIAL (CANCER CENTER ONLY)
Abs Immature Granulocytes: 0.04 10*3/uL (ref 0.00–0.07)
Basophils Absolute: 0.1 10*3/uL (ref 0.0–0.1)
Basophils Relative: 1 %
Eosinophils Absolute: 0.2 10*3/uL (ref 0.0–0.5)
Eosinophils Relative: 3 %
HCT: 40.9 % (ref 36.0–46.0)
Hemoglobin: 13 g/dL (ref 12.0–15.0)
Immature Granulocytes: 1 %
Lymphocytes Relative: 31 %
Lymphs Abs: 2.6 10*3/uL (ref 0.7–4.0)
MCH: 24.9 pg — ABNORMAL LOW (ref 26.0–34.0)
MCHC: 31.8 g/dL (ref 30.0–36.0)
MCV: 78.4 fL — ABNORMAL LOW (ref 80.0–100.0)
Monocytes Absolute: 0.5 10*3/uL (ref 0.1–1.0)
Monocytes Relative: 6 %
Neutro Abs: 5 10*3/uL (ref 1.7–7.7)
Neutrophils Relative %: 58 %
Platelet Count: 204 10*3/uL (ref 150–400)
RBC: 5.22 MIL/uL — ABNORMAL HIGH (ref 3.87–5.11)
RDW: 15.2 % (ref 11.5–15.5)
WBC Count: 8.5 10*3/uL (ref 4.0–10.5)
nRBC: 0 % (ref 0.0–0.2)

## 2020-12-28 LAB — D-DIMER, QUANTITATIVE: D-Dimer, Quant: <0.27 ug/mL-FEU (ref 0.00–0.50)

## 2020-12-28 NOTE — Telephone Encounter (Signed)
Per 12/28/20 los - gave upcoming appointments - confirmed - view mychart

## 2020-12-28 NOTE — Progress Notes (Signed)
Hematology and Oncology Follow Up Visit  Nancy Rivera [id number] 23-Sep-1985 35 y.o. 12/28/2020   Principle Diagnosis:  Acute DVT of the femoral vein of the right lower extremity Lupus anticoagulant positive Prothrombin gene mutation - heterozygous   Past Therapy: Xarelto - progression Coumadin - INR not therapeutic   Current Therapy:   Eliquis 5 mg PO BID    Interim History: Nancy Rivera is here today for follow-up. She is doing well and has no complaints at this time.  Repeat US today showed "near occlusive wall thickening/chronic DVT involving the right femoral and popliteal veins with interval atresia of both of these vessels". She is doing well on Eliquis and has had no issues with bleeding. We discontinued Coumadin in September due to persistent subtherapeutic INR.   No abnormal bruising, no petechiae.  Most recent cycle was regular with light flow. No other blood loss noted.  She denies fever, chills, n/v, cough, rash, dizziness, SOB, chest pain, palpitations, abdominal pain or changes in bowel or bladder habits.  No swelling, tenderness, numbness or tingling in her extremities at this time.  She has notes intermittent broken blood vessels behind the right knee as well as the right ankle. These come and go. No pain or swelling.  We discussed her wearing a compression stocking for added support and she plans to give this a try. She will let us know if she needs a prescription.  No falls or syncope to report.  She has maintained a good appetite and is staying well hydrated. Her weight is 324 lbs.  She has started walking some for exercise.   ECOG Performance Status: 1 - Symptomatic but completely ambulatory  Medications:  Allergies as of 12/28/2020   No Known Allergies      Medication List        Accurate as of December 28, 2020 12:21 PM. If you have any questions, ask your nurse or doctor.          acetaminophen 500 MG tablet Commonly known as: TYLENOL Take  500-1,000 mg by mouth every 6 (six) hours as needed for mild pain or headache.   apixaban 5 MG Tabs tablet Commonly known as: ELIQUIS Take 1 tablet (5 mg total) by mouth 2 (two) times daily.   busPIRone 7.5 MG tablet Commonly known as: BUSPAR Take 7.5 mg by mouth 2 (two) times daily as needed (for anxiety).   labetalol 200 MG tablet Commonly known as: NORMODYNE Take 1 tablet (200 mg total) by mouth 3 (three) times daily. What changed: when to take this   sertraline 50 MG tablet Commonly known as: ZOLOFT Take 50 mg by mouth at bedtime.        Allergies: No Known Allergies  Past Medical History, Surgical history, Social history, and Family History were reviewed and updated.  Review of Systems: All other 10 point review of systems is negative.   Physical Exam:  weight is 324 lb 12.8 oz (147.3 kg) (abnormal). Her oral temperature is 98.8 F (37.1 C). Her blood pressure is 134/80 and her pulse is 66. Her respiration is 18 and oxygen saturation is 98%.   Wt Readings from Last 3 Encounters:  12/28/20 (!) 324 lb 12.8 oz (147.3 kg)  11/02/20 (!) 316 lb (143.3 kg)  09/02/20 (!) 304 lb (137.9 kg)    Ocular: Sclerae unicteric, pupils equal, round and reactive to light Ear-nose-throat: Oropharynx clear, dentition fair Lymphatic: No cervical or supraclavicular adenopathy Lungs no rales or rhonchi, good excursion bilaterally Heart  regular rate and rhythm, no murmur appreciated Abd soft, nontender, positive bowel sounds MSK no focal spinal tenderness, no joint edema Neuro: non-focal, well-oriented, appropriate affect Breasts: Deferred   Lab Results  Component Value Date   WBC 8.5 12/28/2020   HGB 13.0 12/28/2020   HCT 40.9 12/28/2020   MCV 78.4 (L) 12/28/2020   PLT 204 12/28/2020   No results found for: FERRITIN, IRON, TIBC, UIBC, IRONPCTSAT Lab Results  Component Value Date   RBC 5.22 (H) 12/28/2020   No results found for: KPAFRELGTCHN, LAMBDASER, KAPLAMBRATIO No  results found for: IGGSERUM, IGA, IGMSERUM No results found for: Odetta Pink, SPEI   Chemistry      Component Value Date/Time   NA 138 12/28/2020 1040   K 4.8 12/28/2020 1040   CL 103 12/28/2020 1040   CO2 27 12/28/2020 1040   BUN 22 (H) 12/28/2020 1040   CREATININE 1.26 (H) 12/28/2020 1040      Component Value Date/Time   CALCIUM 9.7 12/28/2020 1040   ALKPHOS 66 12/28/2020 1040   AST 14 (L) 12/28/2020 1040   ALT 16 12/28/2020 1040   BILITOT 0.5 12/28/2020 1040       Impression and Plan: Nancy Rivera is a very pleasant 35 yo caucasian female with chronic almost occlusive right lower extremity DVT. She was lupus anticoagulant positive and was heterozygous for the prothrombin gene mutation.  She is tolerating Eliquis nicely and will continue her same regimen.  Follow-up in 4 months.  She can contact our office with any questions or concerns.   Lottie Dawson, NP 10/4/202212:21 PM

## 2020-12-29 LAB — LUPUS ANTICOAGULANT PANEL
DRVVT: 90.7 s — ABNORMAL HIGH (ref 0.0–47.0)
PTT Lupus Anticoagulant: 40.2 s (ref 0.0–51.9)

## 2020-12-29 LAB — DRVVT MIX: dRVVT Mix: 62.6 s — ABNORMAL HIGH (ref 0.0–40.4)

## 2020-12-29 LAB — DRVVT CONFIRM: dRVVT Confirm: 1.7 ratio — ABNORMAL HIGH (ref 0.8–1.2)

## 2021-01-04 DIAGNOSIS — Z20822 Contact with and (suspected) exposure to covid-19: Secondary | ICD-10-CM | POA: Diagnosis not present

## 2021-01-04 DIAGNOSIS — J069 Acute upper respiratory infection, unspecified: Secondary | ICD-10-CM | POA: Diagnosis not present

## 2021-01-10 DIAGNOSIS — H65191 Other acute nonsuppurative otitis media, right ear: Secondary | ICD-10-CM | POA: Diagnosis not present

## 2021-01-10 DIAGNOSIS — Z6841 Body Mass Index (BMI) 40.0 and over, adult: Secondary | ICD-10-CM | POA: Diagnosis not present

## 2021-01-10 DIAGNOSIS — I10 Essential (primary) hypertension: Secondary | ICD-10-CM | POA: Diagnosis not present

## 2021-01-26 DIAGNOSIS — R6883 Chills (without fever): Secondary | ICD-10-CM | POA: Diagnosis not present

## 2021-01-26 DIAGNOSIS — K529 Noninfective gastroenteritis and colitis, unspecified: Secondary | ICD-10-CM | POA: Diagnosis not present

## 2021-04-15 ENCOUNTER — Telehealth: Payer: Self-pay | Admitting: *Deleted

## 2021-04-15 ENCOUNTER — Ambulatory Visit (HOSPITAL_BASED_OUTPATIENT_CLINIC_OR_DEPARTMENT_OTHER): Payer: 59

## 2021-04-15 ENCOUNTER — Encounter: Payer: Self-pay | Admitting: Family

## 2021-04-15 ENCOUNTER — Other Ambulatory Visit: Payer: Self-pay | Admitting: *Deleted

## 2021-04-15 ENCOUNTER — Other Ambulatory Visit: Payer: Self-pay | Admitting: Family

## 2021-04-15 DIAGNOSIS — D6852 Prothrombin gene mutation: Secondary | ICD-10-CM

## 2021-04-15 DIAGNOSIS — R760 Raised antibody titer: Secondary | ICD-10-CM

## 2021-04-15 DIAGNOSIS — I82411 Acute embolism and thrombosis of right femoral vein: Secondary | ICD-10-CM

## 2021-04-15 MED ORDER — ENOXAPARIN SODIUM 150 MG/ML IJ SOSY: 140.0000 mg | PREFILLED_SYRINGE | Freq: Two times a day (BID) | INTRAMUSCULAR | 0 refills | Status: DC

## 2021-04-15 MED ORDER — ENOXAPARIN SODIUM 150 MG/ML IJ SOSY: 140.0000 mg | PREFILLED_SYRINGE | Freq: Two times a day (BID) | INTRAMUSCULAR | 3 refills | Status: DC

## 2021-04-15 NOTE — Progress Notes (Signed)
Patient called to let us know urgent care showed her positive for new DVT on Eliquis. I spoke with Dr. Marin Olp over the phone and we have discontinued her Eliquis and she will now start Lovenox 140 mg SQ BID. Patient verbalized understanding and agreement with the plan.  She has given herself Lovenox injections in the past and states that she is comfortable resuming. She has requested 2 week supply [facility] to [facility] and then then 90 supply to [facility] for long term.  No other questions or concerns at this time. Patient appreciative of call.

## 2021-04-15 NOTE — Telephone Encounter (Signed)
Call received from patient stating that she is having increased swelling to right leg and cramping to bilateral legs. [place] NP notified and would like for pt to come in for [place] of bilateral legs. Pt notified and states that she is able to come in today for [place].  Message sent to radiology to schedule pt.

## 2021-04-15 NOTE — Telephone Encounter (Signed)
Call received from patient stating that her PCP has an Korea machine in their building and that she is going to contact her PCP regarding need for [place] to be done. [place] [facility] NP notified.

## 2021-04-15 NOTE — Telephone Encounter (Signed)
Call received from patient stating that she was able to have the [place] at her PCP's office and just got a call from her PCP that she does have a blood clot to her right popliteal vein and that pt.'s PCP would like for this office to manage anti-coagulants.  [facility] NP notified.  Pt notified per order of S. Eulas Post NP to take Lovenox 140 mg SQ BID.  Pt [facility] [place] and 90 day supply sent to [facility]. Pt states that she has taken Lovenox in the past and has no questions about how to give herself the SQ injection.  Pt did review with this RN how to give a SQ injection correctly.  Pt is appreciative of assistance and has no questions at this time.

## 2021-04-15 NOTE — Telephone Encounter (Signed)
Message left from patient stating that she is not available to do [place] until Monday, 04/18/21 and would like to know if she should go to an ER.  Call placed back to patient and message left to notify her per order of S. Eulas Post NP to go to an Urgent Care or ER to be assessed.  Instructed pt to call office back with any questions or concerns.

## 2021-04-15 NOTE — Telephone Encounter (Signed)
Opened in error

## 2021-04-24 ENCOUNTER — Encounter: Payer: Self-pay | Admitting: Family

## 2021-04-25 ENCOUNTER — Other Ambulatory Visit: Payer: Self-pay | Admitting: Family

## 2021-04-29 ENCOUNTER — Inpatient Hospital Stay: Payer: 59 | Admitting: Family

## 2021-04-29 ENCOUNTER — Encounter: Payer: Self-pay | Admitting: Family

## 2021-04-29 ENCOUNTER — Other Ambulatory Visit: Payer: Self-pay

## 2021-04-29 ENCOUNTER — Inpatient Hospital Stay: Payer: 59 | Attending: Hematology & Oncology

## 2021-04-29 VITALS — BP 143/85 | HR 122 | Temp 98.3°F | Resp 19 | Ht 69.0 in | Wt 334.0 lb

## 2021-04-29 DIAGNOSIS — D6862 Lupus anticoagulant syndrome: Secondary | ICD-10-CM | POA: Diagnosis not present

## 2021-04-29 DIAGNOSIS — I82411 Acute embolism and thrombosis of right femoral vein: Secondary | ICD-10-CM

## 2021-04-29 DIAGNOSIS — Z7901 Long term (current) use of anticoagulants: Secondary | ICD-10-CM | POA: Insufficient documentation

## 2021-04-29 DIAGNOSIS — D6852 Prothrombin gene mutation: Secondary | ICD-10-CM

## 2021-04-29 DIAGNOSIS — R760 Raised antibody titer: Secondary | ICD-10-CM

## 2021-04-29 DIAGNOSIS — D6859 Other primary thrombophilia: Secondary | ICD-10-CM

## 2021-04-29 DIAGNOSIS — Z86718 Personal history of other venous thrombosis and embolism: Secondary | ICD-10-CM | POA: Diagnosis not present

## 2021-04-29 LAB — CMP (CANCER CENTER ONLY)
ALT: 68 U/L — ABNORMAL HIGH (ref 0–44)
AST: 39 U/L (ref 15–41)
Albumin: 4.4 g/dL (ref 3.5–5.0)
Alkaline Phosphatase: 72 U/L (ref 38–126)
Anion gap: 8 (ref 5–15)
BUN: 16 mg/dL (ref 6–20)
CO2: 27 mmol/L (ref 22–32)
Calcium: 9.7 mg/dL (ref 8.9–10.3)
Chloride: 102 mmol/L (ref 98–111)
Creatinine: 1.15 mg/dL — ABNORMAL HIGH (ref 0.44–1.00)
GFR, Estimated: >60 mL/min (ref >60)
Glucose, Bld: 84 mg/dL (ref 70–99)
Potassium: 4.2 mmol/L (ref 3.5–5.1)
Sodium: 137 mmol/L (ref 135–145)
Total Bilirubin: 0.4 mg/dL (ref 0.3–1.2)
Total Protein: 6.9 g/dL (ref 6.5–8.1)

## 2021-04-29 LAB — CBC WITH DIFFERENTIAL (CANCER CENTER ONLY)
Abs Immature Granulocytes: 0.02 10*3/uL (ref 0.00–0.07)
Basophils Absolute: 0.1 10*3/uL (ref 0.0–0.1)
Basophils Relative: 1 %
Eosinophils Absolute: 0.1 10*3/uL (ref 0.0–0.5)
Eosinophils Relative: 2 %
HCT: 40.1 % (ref 36.0–46.0)
Hemoglobin: 12.8 g/dL (ref 12.0–15.0)
Immature Granulocytes: 0 %
Lymphocytes Relative: 32 %
Lymphs Abs: 2.2 10*3/uL (ref 0.7–4.0)
MCH: 25.3 pg — ABNORMAL LOW (ref 26.0–34.0)
MCHC: 31.9 g/dL (ref 30.0–36.0)
MCV: 79.4 fL — ABNORMAL LOW (ref 80.0–100.0)
Monocytes Absolute: 0.4 10*3/uL (ref 0.1–1.0)
Monocytes Relative: 6 %
Neutro Abs: 4 10*3/uL (ref 1.7–7.7)
Neutrophils Relative %: 59 %
Platelet Count: 205 10*3/uL (ref 150–400)
RBC: 5.05 MIL/uL (ref 3.87–5.11)
RDW: 14.2 % (ref 11.5–15.5)
WBC Count: 6.8 10*3/uL (ref 4.0–10.5)
nRBC: 0 % (ref 0.0–0.2)

## 2021-04-29 LAB — D-DIMER, QUANTITATIVE: D-Dimer, Quant: <0.27 ug/mL-FEU (ref 0.00–0.50)

## 2021-04-29 NOTE — Progress Notes (Signed)
Hematology and Oncology Follow Up Visit  Nancy Rivera [id number] 1985/05/28 36 y.o. 04/29/2021   Principle Diagnosis:  Acute DVT of the femoral vein of the right lower extremity Lupus anticoagulant positive Prothrombin gene mutation - heterozygous    Past Therapy: Xarelto - progression Coumadin - INR not therapeutic  Eliquis - recurrent DVT in RLE   Current Therapy:        Lovenox 140 mg SQ BID   Interim History:  Nancy Rivera is here today for follow-up. She is tolerating treatment nicely with Lovenox and is administering as prescribed. She had several short nose bleeds for a few days after starting but this has since resolved.  No other blood loss noted. She has not had a cycle since August 2022.  No bruising or petechiae.  No fever, chills, n/v, cough, rash, dizziness, SOB, chest pain or changes in bowel or bladder habits.  Swelling in her lower extremities is unchanged. Pedal pulses are 2+.  No falls or syncope to report.  She has maintained a good appetite and is staying well hydrated. Her weight is 334 lbs.   ECOG Performance Status: 1 - Symptomatic but completely ambulatory  Medications:  Allergies as of 04/29/2021   No Known Allergies      Medication List        Accurate as of April 29, 2021 12:19 PM. If you have any questions, ask your nurse or doctor.          acetaminophen 500 MG tablet Commonly known as: TYLENOL Take 500-1,000 mg by mouth every 6 (six) hours as needed for mild pain or headache.   acetaminophen 500 MG tablet Commonly known as: TYLENOL 1-2 tablets as needed   busPIRone 7.5 MG tablet Commonly known as: BUSPAR Take 7.5 mg by mouth 2 (two) times daily as needed (for anxiety).   enoxaparin 150 MG/ML injection Commonly known as: Lovenox Inject 0.94 mLs (140 mg total) into the skin every 12 (twelve) hours.   hydrOXYzine 10 MG tablet Commonly known as: ATARAX Take 10 mg by mouth 3 (three) times daily.   hydrOXYzine 25 MG  capsule Commonly known as: VISTARIL SMARTSIG:0.5-1 Capsule(s) By Mouth 3 Times Daily   labetalol 200 MG tablet Commonly known as: NORMODYNE Take 1 tablet (200 mg total) by mouth 3 (three) times daily. What changed: when to take this   sertraline 50 MG tablet Commonly known as: ZOLOFT Take 50 mg by mouth at bedtime.   Slynd 4 MG Tabs Generic drug: Drospirenone 1 tablet   triamcinolone ointment 0.1 % Commonly known as: KENALOG Apply topically as directed.   Vitamin D (Ergocalciferol) 1.25 MG (50000 UNIT) Caps capsule Commonly known as: DRISDOL Take 50,000 Units by mouth once a week.        Allergies: No Known Allergies  Past Medical History, Surgical history, Social history, and Family History were reviewed and updated.  Review of Systems: All other 10 point review of systems is negative.   Physical Exam:  height is 5\' 9"  (1.753 m) and weight is 334 lb (151.5 kg) (abnormal). Her oral temperature is 98.3 F (36.8 C). Her blood pressure is 143/85 (abnormal) and her pulse is 122 (abnormal). Her respiration is 19 and oxygen saturation is 97%.   Wt Readings from Last 3 Encounters:  04/29/21 (!) 334 lb (151.5 kg)  12/28/20 (!) 324 lb 12.8 oz (147.3 kg)  11/02/20 (!) 316 lb (143.3 kg)    Ocular: Sclerae unicteric, pupils equal, round and reactive to light Ear-nose-throat:  Oropharynx clear, dentition fair Lymphatic: No cervical or supraclavicular adenopathy Lungs no rales or rhonchi, good excursion bilaterally Heart regular rate and rhythm, no murmur appreciated Abd soft, nontender, positive bowel sounds MSK no focal spinal tenderness, no joint edema Neuro: non-focal, well-oriented, appropriate affect Breasts: Deferred   Lab Results  Component Value Date   WBC 6.8 04/29/2021   HGB 12.8 04/29/2021   HCT 40.1 04/29/2021   MCV 79.4 (L) 04/29/2021   PLT 205 04/29/2021   No results found for: FERRITIN, IRON, TIBC, UIBC, IRONPCTSAT Lab Results  Component Value Date    RBC 5.05 04/29/2021   No results found for: KPAFRELGTCHN, LAMBDASER, KAPLAMBRATIO No results found for: IGGSERUM, IGA, IGMSERUM No results found for: Odetta Pink, SPEI   Chemistry      Component Value Date/Time   NA 137 04/29/2021 1106   K 4.2 04/29/2021 1106   CL 102 04/29/2021 1106   CO2 27 04/29/2021 1106   BUN 16 04/29/2021 1106   CREATININE 1.15 (H) 04/29/2021 1106      Component Value Date/Time   CALCIUM 9.7 04/29/2021 1106   ALKPHOS 72 04/29/2021 1106   AST 39 04/29/2021 1106   ALT 68 (H) 04/29/2021 1106   BILITOT 0.4 04/29/2021 1106       Impression and Plan: Nancy Rivera is a very pleasant 36 yo caucasian female with chronic almost occlusive right lower extremity DVT. She seems to be tolerating treatment with Lovenox nicely. Unfortunately, she did have a recurrent RLE DVT while on Eliquis and was taking BID as prescribed.  We will plan to see her back in April and get a repeat US that same day to evaluate her response.   Lottie Dawson, NP 2/3/202312:19 PM

## 2021-04-30 ENCOUNTER — Telehealth (HOSPITAL_BASED_OUTPATIENT_CLINIC_OR_DEPARTMENT_OTHER): Payer: Self-pay

## 2021-05-01 LAB — DRVVT CONFIRM: dRVVT Confirm: 1.3 ratio — ABNORMAL HIGH (ref 0.8–1.2)

## 2021-05-01 LAB — PTT-LA MIX: PTT-LA Mix: 51 s — ABNORMAL HIGH (ref 0.0–48.9)

## 2021-05-01 LAB — HEXAGONAL PHASE PHOSPHOLIPID: Hexagonal Phase Phospholipid: 6 s (ref 0–11)

## 2021-05-01 LAB — LUPUS ANTICOAGULANT PANEL
DRVVT: 50.4 s — ABNORMAL HIGH (ref 0.0–47.0)
PTT Lupus Anticoagulant: 56.6 s — ABNORMAL HIGH (ref 0.0–51.9)

## 2021-05-01 LAB — DRVVT MIX: dRVVT Mix: 42.4 s — ABNORMAL HIGH (ref 0.0–40.4)

## 2021-05-05 ENCOUNTER — Encounter: Payer: Self-pay | Admitting: Family

## 2021-06-06 ENCOUNTER — Telehealth: Payer: Self-pay | Admitting: *Deleted

## 2021-06-06 NOTE — Telephone Encounter (Signed)
Received a call from patient that she slipped on her sons toy and hurt her ankle on the right side.  She is experiencing mild swelling of the ankle.  No bruising or discoloration, no warmth noted.  Patient is able to walk without difficulty but it is sore.  Spoke with Lottie Dawson NP who said that if her swelling continues or worsens or she notices any discoloration or warmth or bruising to go to Urgent Care to be evaluated.  Left message on Patients personal voicemail. Told patient to call us if any questions.   ?

## 2021-06-10 ENCOUNTER — Encounter (HOSPITAL_BASED_OUTPATIENT_CLINIC_OR_DEPARTMENT_OTHER): Payer: Self-pay | Admitting: *Deleted

## 2021-06-10 ENCOUNTER — Other Ambulatory Visit: Payer: Self-pay

## 2021-06-10 ENCOUNTER — Emergency Department (HOSPITAL_BASED_OUTPATIENT_CLINIC_OR_DEPARTMENT_OTHER)
Admission: EM | Admit: 2021-06-10 | Discharge: 2021-06-11 | Disposition: A | Discharge status: Home / Self Care | Payer: 59 | Attending: Emergency Medicine | Admitting: Emergency Medicine

## 2021-06-10 DIAGNOSIS — W010XXA Fall on same level from slipping, tripping and stumbling without subsequent striking against object, initial encounter: Secondary | ICD-10-CM | POA: Insufficient documentation

## 2021-06-10 DIAGNOSIS — S8991XA Unspecified injury of right lower leg, initial encounter: Secondary | ICD-10-CM | POA: Diagnosis present

## 2021-06-10 DIAGNOSIS — Z86718 Personal history of other venous thrombosis and embolism: Secondary | ICD-10-CM | POA: Diagnosis not present

## 2021-06-10 DIAGNOSIS — M25561 Pain in right knee: Secondary | ICD-10-CM

## 2021-06-10 DIAGNOSIS — Z7901 Long term (current) use of anticoagulants: Secondary | ICD-10-CM | POA: Diagnosis not present

## 2021-06-10 DIAGNOSIS — S8001XA Contusion of right knee, initial encounter: Secondary | ICD-10-CM | POA: Diagnosis not present

## 2021-06-10 HISTORY — DX: Acute embolism and thrombosis of unspecified deep veins of unspecified lower extremity: I82.409

## 2021-06-10 LAB — BASIC METABOLIC PANEL
Anion gap: 7 (ref 5–15)
BUN: 25 mg/dL — ABNORMAL HIGH (ref 6–20)
CO2: 22 mmol/L (ref 22–32)
Calcium: 8.5 mg/dL — ABNORMAL LOW (ref 8.9–10.3)
Chloride: 105 mmol/L (ref 98–111)
Creatinine, Ser: 1.5 mg/dL — ABNORMAL HIGH (ref 0.44–1.00)
GFR, Estimated: 45 mL/min — ABNORMAL LOW (ref >60)
Glucose, Bld: 105 mg/dL — ABNORMAL HIGH (ref 70–99)
Potassium: 3.9 mmol/L (ref 3.5–5.1)
Sodium: 134 mmol/L — ABNORMAL LOW (ref 135–145)

## 2021-06-10 LAB — CBC WITH DIFFERENTIAL/PLATELET
Abs Immature Granulocytes: 0.04 10*3/uL (ref 0.00–0.07)
Basophils Absolute: 0.1 10*3/uL (ref 0.0–0.1)
Basophils Relative: 1 %
Eosinophils Absolute: 0.1 10*3/uL (ref 0.0–0.5)
Eosinophils Relative: 1 %
HCT: 39.9 % (ref 36.0–46.0)
Hemoglobin: 12.9 g/dL (ref 12.0–15.0)
Immature Granulocytes: 0 %
Lymphocytes Relative: 32 %
Lymphs Abs: 3.4 10*3/uL (ref 0.7–4.0)
MCH: 25.5 pg — ABNORMAL LOW (ref 26.0–34.0)
MCHC: 32.3 g/dL (ref 30.0–36.0)
MCV: 79 fL — ABNORMAL LOW (ref 80.0–100.0)
Monocytes Absolute: 0.6 10*3/uL (ref 0.1–1.0)
Monocytes Relative: 5 %
Neutro Abs: 6.4 10*3/uL (ref 1.7–7.7)
Neutrophils Relative %: 61 %
Platelets: 208 10*3/uL (ref 150–400)
RBC: 5.05 MIL/uL (ref 3.87–5.11)
RDW: 14.5 % (ref 11.5–15.5)
WBC: 10.5 10*3/uL (ref 4.0–10.5)
nRBC: 0 % (ref 0.0–0.2)

## 2021-06-10 MED ORDER — OXYCODONE-ACETAMINOPHEN 5-325 MG PO TABS: 1.0000 | ORAL_TABLET | Freq: Four times a day (QID) | ORAL | 0 refills | Status: DC | PRN

## 2021-06-10 MED ORDER — OXYCODONE-ACETAMINOPHEN 5-325 MG PO TABS
1.0000 | ORAL_TABLET | Freq: Once | ORAL | Status: AC
  Administered 2021-06-10: 1 via ORAL
  Filled 2021-06-10: qty 1

## 2021-06-10 NOTE — ED Triage Notes (Addendum)
She slipped on a toy 4 days ago. Injury to her right knee. She has a DVT and can only take Tylenol. Her last dose was 3 hours ago. She had a negative xray yesterday at [facility]. Pt is ambulatory.  ?

## 2021-06-10 NOTE — ED Provider Notes (Signed)
?[facility] EMERGENCY DEPARTMENT ?Provider Note ? ? ?CSN: [id number] ?Arrival date & time: 06/10/21  2029 ? ?  ? ?History ? ?Chief Complaint  ?Patient presents with  ? Knee Injury  ? ? ?Nancy Rivera is a 36 y.o. female with PMHx DVT (on Lovenox BID) who presents to the ED today with complaint of chemical fall that occurred 2 days ago.  She fell and landed directly onto her right knee.  She reports that she went to [facility] and had a negative x-ray yesterday.  She reports she was given a steroid injection and then prescribed prednisone however unsure exactly why she was prescribed same.  She has not picked it up.  She reports that this morning she had worsening swelling and stiffness to her right knee with worsening bruising.  She states that she has been unable to put much weight on it.  She was not provided crutches at [facility] and she was not given Ortho follow-up.  States that she has been taking Tylenol due to being on Lovenox without much relief.  She has no other complaints at this time.  ? ?Patient does mention an extensive history of failed coagulation.  She reports that she was diagnosed with a DVT in the right leg in June.  She was on Xarelto at that time however failed.  She was switched to Lovenox and Coumadin however again failed.  She was most recently on Eliquis however failed again and is now on Lovenox twice daily.  She follows with hematology for same.  ? ?The history is provided by the patient, a friend and medical records.  ? ?  ? ?Home Medications ?Prior to Admission medications   ?Medication Sig Start Date End Date Taking? Authorizing Provider  ?oxyCODONE-acetaminophen (PERCOCET/ROXICET) 5-325 MG tablet Take 1 tablet by mouth every 6 (six) hours as needed for severe pain. 06/10/21  Yes Jene Oravec, PA-C  ?acetaminophen (TYLENOL) 500 MG tablet Take 500-1,000 mg by mouth every 6 (six) hours as needed for mild pain or headache.    [provider]  ?acetaminophen  (TYLENOL) 500 MG tablet 1-2 tablets as needed    [provider]  ?busPIRone (BUSPAR) 7.5 MG tablet Take 7.5 mg by mouth 2 (two) times daily as needed (for anxiety).    [provider]  ?Drospirenone (SLYND) 4 MG TABS 1 tablet    [provider]  ?enoxaparin (LOVENOX) 150 MG/ML injection Inject 0.94 mLs (140 mg total) into the skin every 12 (twelve) hours. 04/15/21   Celso Amy, NP  ?hydrOXYzine (ATARAX) 10 MG tablet Take 10 mg by mouth 3 (three) times daily. 11/03/20   [provider]  ?hydrOXYzine (VISTARIL) 25 MG capsule SMARTSIG:0.5-1 Capsule(s) By Mouth 3 Times Daily 11/03/20   [provider]  ?labetalol (NORMODYNE) 200 MG tablet Take 1 tablet (200 mg total) by mouth 3 (three) times daily. ?Patient taking differently: Take 200 mg by mouth 2 (two) times daily. 07/08/20   Everlene Farrier, MD  ?sertraline (ZOLOFT) 50 MG tablet Take 50 mg by mouth at bedtime.    [provider]  ?triamcinolone ointment (KENALOG) 0.1 % Apply topically as directed. 11/03/20   [provider]  ?Vitamin D, Ergocalciferol, (DRISDOL) 1.25 MG (50000 UNIT) CAPS capsule Take 50,000 Units by mouth once a week. 04/24/21   [provider]  ?   ? ?Allergies    ?Patient has no known allergies.   ? ?Review of Systems   ?Review of Systems  ?  Constitutional:  Negative for chills and fever.  ?Musculoskeletal:  Positive for arthralgias and joint swelling.  ?Skin:  Positive for color change (bruising).  ?All other systems reviewed and are negative. ? ?Physical Exam ?Updated Vital Signs ?BP (!) 155/97 (BP Location: Right Arm)   Pulse 79   Temp 98.3 ?F (36.8 ?C)   Resp 18   Ht '5\' 9"'$  (1.753 m)   Wt (!) 151.5 kg   SpO2 96%   BMI 49.32 kg/m?  ?Physical Exam ?Vitals and nursing note reviewed.  ?Constitutional:   ?   Appearance: She is obese. She is not ill-appearing.  ?HENT:  ?   Head: Normocephalic and atraumatic.  ?Eyes:  ?   Conjunctiva/sclera: Conjunctivae normal.   ?Cardiovascular:  ?   Rate and Rhythm: Normal rate and regular rhythm.  ?Pulmonary:  ?   Effort: Pulmonary effort is normal.  ?   Breath sounds: Normal breath sounds.  ?Musculoskeletal:  ?   Comments: Moderate swelling noted to right knee with associated ecchymosis and tenderness palpation.  Range of motion limited significantly secondary to pain.  Sensation intact throughout.  2+ DP pulse.  Compartments are soft.   ?Skin: ?   General: Skin is warm and dry.  ?   Coloration: Skin is not jaundiced.  ?Neurological:  ?   Mental Status: She is alert.  ? ? ?ED Results / Procedures / Treatments   ?Labs ?(all labs ordered are listed, but only abnormal results are displayed) ?Labs Reviewed  ?CBC WITH DIFFERENTIAL/PLATELET - Abnormal; Notable for the following components:  ?    Result Value  ? MCV 79.0 (*)   ? MCH 25.5 (*)   ? All other components within normal limits  ?BASIC METABOLIC PANEL - Abnormal; Notable for the following components:  ? Sodium 134 (*)   ? Glucose, Bld 105 (*)   ? BUN 25 (*)   ? Creatinine, Ser 1.50 (*)   ? Calcium 8.5 (*)   ? GFR, Estimated 45 (*)   ? All other components within normal limits  ? ? ?EKG ?None ? ?Radiology ?No results found. ? ?Procedures ?Procedures  ? ? ?Medications Ordered in ED ?Medications  ?oxyCODONE-acetaminophen (PERCOCET/ROXICET) 5-325 MG per tablet 1 tablet (1 tablet Oral Given 06/10/21 2217)  ? ? ?ED Course/ Medical Decision Making/ A&P ?  ?                        ?Medical Decision Making ?36 year old female who presents to the ED today status post fall that occurred 4 days ago.  Has had worsening swelling, bruising, pain to right knee since that time.  Negative x-ray at [facility] yesterday.  Currently on Lovenox twice daily for DVT.  Has had multiple failed interventions for DVT since June.  On arrival to the ED today vitals are stable.  Patient appears to be in no acute distress.  She is noted to have significant swelling, bruising, tenderness palpation to the  right knee.  She is neurovascular intact.  Her compartments are soft.  Given she had a negative x-ray yesterday I do not feel she requires repeat at this time however given she is on Lovenox concern for possible hematoma in the joint.  We will plan for CBC and BMP to assess for hemoglobin levels at this time.  Will provide pain medication, knee immobilizer, crutches.  Ultimately patient will need to follow-up with orthopedist for further eval.  We currently do not have ultrasound  here at this time however we will plan for outpatient DVT study tomorrow given extensive history of failed interventions for DVT since June with recurrence in January of this year.  ? ?Work-up overall reassuring.  Hemoglobin stable and unchanged.  Patient provided with Ace bandage as knee immobilizer too small for circumference of knee.  Patient provided with crutches.  She will be discharged home with additional Percocet for pain control.  RICE therapy has been discussed.  Have scheduled a DVT study for her tomorrow around 11 AM.  She is advised to come to the radiology department around 1045 for registration.  She will be provided Ortho follow-up for additional evaluation of her knee pain.  She is in agreement with plan and stable for discharge. ? ?Problems Addressed: ?Acute pain of right knee: acute illness or injury ? ?Amount and/or Complexity of Data Reviewed ?Labs: ordered. ?   Details: CBC without leukocytosis.  Hemoglobin stable at 12.9 and unchanged from previous. ?BMP with a slightly elevated creatinine of 1.50.  Baseline around 1.20-1.24. ? ?Risk ?Prescription drug management. ? ? ? ? ? ? ? ? ? ?Final Clinical Impression(s) / ED Diagnoses ?Final diagnoses:  ?Acute pain of right knee  ? ? ?Rx / DC Orders ?ED Discharge Orders   ? ?      Ordered  ?  US Venous Img Lower Unilateral Right       ? 06/10/21 2238  ?  oxyCODONE-acetaminophen (PERCOCET/ROXICET) 5-325 MG tablet  Every 6 hours PRN       ? 06/10/21 2338  ? ?  ?  ? ?   ? ? ? ?Discharge Instructions   ? ?  ?Please arrive at the radiology department at 10:45 AM tomorrow for an ultrasound at 11 AM.  ? ?Pick up pain medication in the AM and take as needed. This medication already has tylenol in it

## 2021-06-10 NOTE — Discharge Instructions (Addendum)
Please arrive at the radiology department at 10:45 AM tomorrow for an ultrasound at 11 AM.  ? ?Pick up pain medication in the AM and take as needed. This medication already has tylenol in it (325 mg). Please be weary regarding additional tylenol throughout the day. Do not exceed 4,000 mg Tylenol in a daily dose.  ? ?Please follow up with Dr. [facility] for further evaluation of your knee pain.  ? ?While at home please rest, ice, and elevate your knee to help reduce swelling/inflammation.  ? ?Use crutches and ace wrap until you can be seen by ortho.  ? ?Return to the ED for any new/worsening symptoms ?

## 2021-06-11 ENCOUNTER — Ambulatory Visit (HOSPITAL_BASED_OUTPATIENT_CLINIC_OR_DEPARTMENT_OTHER)
Admission: RE | Admit: 2021-06-11 | Discharge: 2021-06-11 | Disposition: A | Discharge status: Home / Self Care | Payer: 59 | Source: Ambulatory Visit | Attending: Pain Medicine | Admitting: Pain Medicine

## 2021-06-11 DIAGNOSIS — M79604 Pain in right leg: Secondary | ICD-10-CM | POA: Insufficient documentation

## 2021-06-11 DIAGNOSIS — Z86718 Personal history of other venous thrombosis and embolism: Secondary | ICD-10-CM | POA: Insufficient documentation

## 2021-06-16 ENCOUNTER — Ambulatory Visit: Payer: 59 | Admitting: Family Medicine

## 2021-07-01 ENCOUNTER — Telehealth: Payer: Self-pay | Admitting: *Deleted

## 2021-07-01 ENCOUNTER — Ambulatory Visit (HOSPITAL_BASED_OUTPATIENT_CLINIC_OR_DEPARTMENT_OTHER)
Admission: RE | Admit: 2021-07-01 | Discharge: 2021-07-01 | Disposition: A | Discharge status: Home / Self Care | Payer: 59 | Source: Ambulatory Visit | Attending: Family | Admitting: Family

## 2021-07-01 DIAGNOSIS — D6852 Prothrombin gene mutation: Secondary | ICD-10-CM | POA: Insufficient documentation

## 2021-07-01 DIAGNOSIS — R760 Raised antibody titer: Secondary | ICD-10-CM | POA: Insufficient documentation

## 2021-07-01 DIAGNOSIS — I82411 Acute embolism and thrombosis of right femoral vein: Secondary | ICD-10-CM | POA: Insufficient documentation

## 2021-07-01 NOTE — Telephone Encounter (Signed)
Call received from patient stating that she was at physical therapy this morning and that her calf measurements on her right leg measured 3 cm larger than her left left and that the right leg also has pitting edema and bruising to the top and bottom of her foot.  Pt denies any pain or tenderness to the right leg.  Pt states that there are no problems with her left leg.  [place] NP notified.  Order received for pt to come in today for US Venous of right leg. [facility] states that they can do [place] today at [facility].  Call placed back to patient and patient notified of time.  Pt states that she will be here today at [facility] for [place] and is appreciative of assistance. ?

## 2021-07-04 ENCOUNTER — Telehealth: Payer: Self-pay | Admitting: *Deleted

## 2021-07-04 NOTE — Telephone Encounter (Signed)
As noted below by Lottie Dawson, NP, I informed her that she does not have a DVT. You do have some chronic scarring within some deep and superficial veins that are likelly the cause of her pain and swelling with post phlebitic syndrome. You need to hydrate and wear compression stockings to help reduce/prevent the swelling and give those veins extra support. She verbalized understanding. ?

## 2021-07-04 NOTE — Telephone Encounter (Signed)
-----   Message from Celso Amy, NP sent at 07/04/2021  8:41 AM EDT ----- ?No DVT. She dies have some chronic scarring within some deep and superficial veins that are likely the cause of her pain and swelling with post phlebitic syndrome. She needs to hydrate and wear compression stockings to help reduce/prevent the swelling and give those veins extra support. Thank you! ? ?Sarah ?----- Message ----- ?From: Interface, Rad Results In ?Sent: 07/01/2021   4:26 PM EDT ?To: Celso Amy, NP ? ? ?

## 2021-07-06 ENCOUNTER — Ambulatory Visit (HOSPITAL_BASED_OUTPATIENT_CLINIC_OR_DEPARTMENT_OTHER): Payer: 59

## 2021-07-06 ENCOUNTER — Inpatient Hospital Stay: Payer: 59 | Admitting: Family

## 2021-07-06 ENCOUNTER — Encounter: Payer: Self-pay | Admitting: Family

## 2021-07-06 ENCOUNTER — Inpatient Hospital Stay: Payer: 59 | Attending: Hematology & Oncology

## 2021-07-06 VITALS — BP 123/85 | HR 64 | Temp 98.5°F | Resp 18 | Wt 330.0 lb

## 2021-07-06 DIAGNOSIS — D6852 Prothrombin gene mutation: Secondary | ICD-10-CM

## 2021-07-06 DIAGNOSIS — D6862 Lupus anticoagulant syndrome: Secondary | ICD-10-CM | POA: Diagnosis not present

## 2021-07-06 DIAGNOSIS — Z86718 Personal history of other venous thrombosis and embolism: Secondary | ICD-10-CM | POA: Diagnosis not present

## 2021-07-06 DIAGNOSIS — R760 Raised antibody titer: Secondary | ICD-10-CM

## 2021-07-06 DIAGNOSIS — Z7901 Long term (current) use of anticoagulants: Secondary | ICD-10-CM | POA: Diagnosis not present

## 2021-07-06 DIAGNOSIS — I82411 Acute embolism and thrombosis of right femoral vein: Secondary | ICD-10-CM

## 2021-07-06 LAB — CMP (CANCER CENTER ONLY)
ALT: 41 U/L (ref 0–44)
AST: 20 U/L (ref 15–41)
Albumin: 4.1 g/dL (ref 3.5–5.0)
Alkaline Phosphatase: 51 U/L (ref 38–126)
Anion gap: 9 (ref 5–15)
BUN: 19 mg/dL (ref 6–20)
CO2: 24 mmol/L (ref 22–32)
Calcium: 9.1 mg/dL (ref 8.9–10.3)
Chloride: 105 mmol/L (ref 98–111)
Creatinine: 1.26 mg/dL — ABNORMAL HIGH (ref 0.44–1.00)
GFR, Estimated: 56 mL/min — ABNORMAL LOW (ref >60)
Glucose, Bld: 93 mg/dL (ref 70–99)
Potassium: 4.3 mmol/L (ref 3.5–5.1)
Sodium: 138 mmol/L (ref 135–145)
Total Bilirubin: 0.4 mg/dL (ref 0.3–1.2)
Total Protein: 6.5 g/dL (ref 6.5–8.1)

## 2021-07-06 LAB — CBC WITH DIFFERENTIAL (CANCER CENTER ONLY)
Abs Immature Granulocytes: 0.02 10*3/uL (ref 0.00–0.07)
Basophils Absolute: 0.1 10*3/uL (ref 0.0–0.1)
Basophils Relative: 1 %
Eosinophils Absolute: 0.2 10*3/uL (ref 0.0–0.5)
Eosinophils Relative: 2 %
HCT: 38.5 % (ref 36.0–46.0)
Hemoglobin: 12.2 g/dL (ref 12.0–15.0)
Immature Granulocytes: 0 %
Lymphocytes Relative: 37 %
Lymphs Abs: 2.6 10*3/uL (ref 0.7–4.0)
MCH: 25.2 pg — ABNORMAL LOW (ref 26.0–34.0)
MCHC: 31.7 g/dL (ref 30.0–36.0)
MCV: 79.5 fL — ABNORMAL LOW (ref 80.0–100.0)
Monocytes Absolute: 0.3 10*3/uL (ref 0.1–1.0)
Monocytes Relative: 4 %
Neutro Abs: 4 10*3/uL (ref 1.7–7.7)
Neutrophils Relative %: 56 %
Platelet Count: 191 10*3/uL (ref 150–400)
RBC: 4.84 MIL/uL (ref 3.87–5.11)
RDW: 14.2 % (ref 11.5–15.5)
WBC Count: 7.2 10*3/uL (ref 4.0–10.5)
nRBC: 0 % (ref 0.0–0.2)

## 2021-07-06 LAB — D-DIMER, QUANTITATIVE: D-Dimer, Quant: 0.38 ug/mL-FEU (ref 0.00–0.50)

## 2021-07-06 NOTE — Progress Notes (Signed)
?Hematology and Oncology Follow Up Visit ? ?Nancy Rivera ?[id number] ?Dec 30, 1985 36 y.o. ?07/06/2021 ? ? ?Principle Diagnosis:  ?Acute DVT of the femoral vein of the right lower extremity ?Lupus anticoagulant positive ?Prothrombin gene mutation - heterozygous  ?  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  ?Current Therapy:        ?Lovenox 140 mg SQ BID ?  ?Interim History:  Nancy Rivera is here today for follow-up. She had her repeat US of the right leg last week. This showed no evidence of DVT but she did have persistent synechiae from the previous DVT within the superficial femoral vein proximally and the popliteal vein.  ?She still has some pain and swelling in the right leg but states that this has been improved since wearing her compression stocking. She wears this 2 nights a week as well.  ?She gets some bruising around the injection sites. No abnormal bruising, petechiae or blood loss noted.  ?No fever, chills, n/v, cough, rash, dizziness, SOB, chest pain, palpitations, abdominal pain or changes in bowel or bladder habits.  ?No numbness or tingling in her extremities at this time.  ?No falls or syncope.  ?She has maintained a good appetite and is staying well hydrated. Her weight is stable at 334 lbs.  ? ?ECOG Performance Status: 1 - Symptomatic but completely ambulatory ? ?Medications:  ?Allergies as of 07/06/2021   ?No Known Allergies ?  ? ?  ?Medication List  ?  ? ?  ? Accurate as of July 06, 2021 10:21 AM. If you have any questions, ask your nurse or doctor.  ?  ?  ? ?  ? ?acetaminophen 500 MG tablet ?Commonly known as: TYLENOL ?Take 500-1,000 mg by mouth every 6 (six) hours as needed for mild pain or headache. ?  ?acetaminophen 500 MG tablet ?Commonly known as: TYLENOL ?1-2 tablets as needed ?  ?busPIRone 7.5 MG tablet ?Commonly known as: BUSPAR ?Take 7.5 mg by mouth 2 (two) times daily as needed (for anxiety). ?  ?enoxaparin 150 MG/ML  injection ?Commonly known as: Lovenox ?Inject 0.94 mLs (140 mg total) into the skin every 12 (twelve) hours. ?  ?hydrOXYzine 10 MG tablet ?Commonly known as: ATARAX ?Take 10 mg by mouth 3 (three) times daily. ?  ?hydrOXYzine 25 MG capsule ?Commonly known as: VISTARIL ?SMARTSIG:0.5-1 Capsule(s) By Mouth 3 Times Daily ?  ?labetalol 200 MG tablet ?Commonly known as: NORMODYNE ?Take 1 tablet (200 mg total) by mouth 3 (three) times daily. ?What changed: when to take this ?  ?oxyCODONE-acetaminophen 5-325 MG tablet ?Commonly known as: PERCOCET/ROXICET ?Take 1 tablet by mouth every 6 (six) hours as needed for severe pain. ?  ?sertraline 50 MG tablet ?Commonly known as: ZOLOFT ?Take 50 mg by mouth at bedtime. ?  ?Slynd 4 MG Tabs ?Generic drug: Drospirenone ?1 tablet ?  ?triamcinolone ointment 0.1 % ?Commonly known as: KENALOG ?Apply topically as directed. ?  ?Vitamin D (Ergocalciferol) 1.25 MG (50000 UNIT) Caps capsule ?Commonly known as: DRISDOL ?Take 50,000 Units by mouth once a week. ?  ? ?  ? ? ?Allergies: No Known Allergies ? ?Past Medical History, Surgical history, Social history, and Family History were reviewed and updated. ? ?Review of Systems: ?All other 10 point review of systems is negative.  ? ?Physical Exam: ? vitals were not taken for this visit.  ? ?Wt Readings from Last 3 Encounters:  ?06/10/21 (!) 334 lb (151.5 kg)  ?04/29/21 Marland Kitchen)  334 lb (151.5 kg)  ?12/28/20 (!) 324 lb 12.8 oz (147.3 kg)  ? ? ?Ocular: Sclerae unicteric, pupils equal, round and reactive to light ?Ear-nose-throat: Oropharynx clear, dentition fair ?Lymphatic: No cervical or supraclavicular adenopathy ?Lungs no rales or rhonchi, good excursion bilaterally ?Heart regular rate and rhythm, no murmur appreciated ?Abd soft, nontender, positive bowel sounds ?MSK no focal spinal tenderness, no joint edema ?Neuro: non-focal, well-oriented, appropriate affect ?Breasts: Deferred  ? ?Lab Results  ?Component Value Date  ? WBC 7.2 07/06/2021  ? HGB 12.2  07/06/2021  ? HCT 38.5 07/06/2021  ? MCV 79.5 (L) 07/06/2021  ? PLT 191 07/06/2021  ? ?No results found for: FERRITIN, IRON, TIBC, UIBC, IRONPCTSAT ?Lab Results  ?Component Value Date  ? RBC 4.84 07/06/2021  ? ?No results found for: KPAFRELGTCHN, LAMBDASER, KAPLAMBRATIO ?No results found for: IGGSERUM, IGA, IGMSERUM ?No results found for: TOTALPROTELP, ALBUMINELP, A1GS, A2GS, BETS, BETA2SER, GAMS, MSPIKE, SPEI ?  Chemistry   ?   ?Component Value Date/Time  ? NA 134 (L) 06/10/2021 2143  ? K 3.9 06/10/2021 2143  ? CL 105 06/10/2021 2143  ? CO2 22 06/10/2021 2143  ? BUN 25 (H) 06/10/2021 2143  ? CREATININE 1.50 (H) 06/10/2021 2143  ? CREATININE 1.15 (H) 04/29/2021 1106  ?    ?Component Value Date/Time  ? CALCIUM 8.5 (L) 06/10/2021 2143  ? ALKPHOS 72 04/29/2021 1106  ? AST 39 04/29/2021 1106  ? ALT 68 (H) 04/29/2021 1106  ? BILITOT 0.4 04/29/2021 1106  ?  ? ? ? ?Impression and Plan: Nancy Rivera is a very pleasant 36 yo caucasian female with chronic almost occlusive right lower extremity DVT. ?She is doing well on Lovenox and will continue her same regimen.  ?D-dimer is 0.38.  ?Follow-up in 4 months.  ? ?Lottie Dawson, NP ?4/12/202310:21 AM ? ?

## 2021-07-08 LAB — PTT-LA MIX: PTT-LA Mix: 54.4 s — ABNORMAL HIGH (ref 0.0–40.5)

## 2021-07-08 LAB — HEXAGONAL PHASE PHOSPHOLIPID: Hexagonal Phase Phospholipid: 9 s (ref 0–11)

## 2021-07-08 LAB — LUPUS ANTICOAGULANT PANEL
DRVVT: 59 s — ABNORMAL HIGH (ref 0.0–47.0)
PTT Lupus Anticoagulant: 58.9 s — ABNORMAL HIGH (ref 0.0–43.5)

## 2021-07-08 LAB — DRVVT CONFIRM: dRVVT Confirm: 1.3 ratio — ABNORMAL HIGH (ref 0.8–1.2)

## 2021-07-08 LAB — DRVVT MIX: dRVVT Mix: 46.3 s — ABNORMAL HIGH (ref 0.0–40.4)

## 2021-07-26 ENCOUNTER — Telehealth: Payer: Self-pay | Admitting: *Deleted

## 2021-07-26 NOTE — Telephone Encounter (Signed)
Call received from patient to inform Dr. Marin Olp that she is currently receiving PT for her right leg from a fall and that tenderness to her left calf started yesterday which her PT instructed her to elevate her leg, apply ice and do light stretching.  Pt states that her PT told her that if the pain to her left leg is not better by Friday, to contact this office. Pt states that she has no swelling or redness to the left leg. Dr. Marin Olp notified.  ?

## 2021-07-28 ENCOUNTER — Other Ambulatory Visit: Payer: Self-pay | Admitting: Hematology & Oncology

## 2021-07-28 DIAGNOSIS — M79605 Pain in left leg: Secondary | ICD-10-CM

## 2021-07-29 ENCOUNTER — Other Ambulatory Visit: Payer: Self-pay

## 2021-07-29 ENCOUNTER — Encounter: Payer: Self-pay | Admitting: Hematology & Oncology

## 2021-07-29 ENCOUNTER — Ambulatory Visit (HOSPITAL_BASED_OUTPATIENT_CLINIC_OR_DEPARTMENT_OTHER): Payer: 59

## 2021-07-29 ENCOUNTER — Ambulatory Visit (HOSPITAL_BASED_OUTPATIENT_CLINIC_OR_DEPARTMENT_OTHER)
Admission: RE | Admit: 2021-07-29 | Discharge: 2021-07-29 | Disposition: A | Discharge status: Home / Self Care | Payer: 59 | Source: Ambulatory Visit | Attending: Hematology & Oncology | Admitting: Hematology & Oncology

## 2021-07-29 ENCOUNTER — Emergency Department (HOSPITAL_BASED_OUTPATIENT_CLINIC_OR_DEPARTMENT_OTHER)
Admission: EM | Admit: 2021-07-29 | Discharge: 2021-07-29 | Disposition: A | Discharge status: Home / Self Care | Payer: 59 | Attending: Emergency Medicine | Admitting: Emergency Medicine

## 2021-07-29 ENCOUNTER — Emergency Department (HOSPITAL_BASED_OUTPATIENT_CLINIC_OR_DEPARTMENT_OTHER): Payer: 59

## 2021-07-29 ENCOUNTER — Encounter (HOSPITAL_BASED_OUTPATIENT_CLINIC_OR_DEPARTMENT_OTHER): Payer: Self-pay | Admitting: Emergency Medicine

## 2021-07-29 ENCOUNTER — Inpatient Hospital Stay: Payer: 59 | Attending: Hematology & Oncology | Admitting: Hematology & Oncology

## 2021-07-29 VITALS — BP 120/71 | HR 86 | Temp 98.5°F | Resp 20

## 2021-07-29 DIAGNOSIS — M79662 Pain in left lower leg: Secondary | ICD-10-CM | POA: Diagnosis present

## 2021-07-29 DIAGNOSIS — Z86718 Personal history of other venous thrombosis and embolism: Secondary | ICD-10-CM | POA: Insufficient documentation

## 2021-07-29 DIAGNOSIS — N9489 Other specified conditions associated with female genital organs and menstrual cycle: Secondary | ICD-10-CM | POA: Diagnosis not present

## 2021-07-29 DIAGNOSIS — Z79899 Other long term (current) drug therapy: Secondary | ICD-10-CM | POA: Insufficient documentation

## 2021-07-29 DIAGNOSIS — S8012XA Contusion of left lower leg, initial encounter: Secondary | ICD-10-CM | POA: Insufficient documentation

## 2021-07-29 DIAGNOSIS — I82401 Acute embolism and thrombosis of unspecified deep veins of right lower extremity: Secondary | ICD-10-CM

## 2021-07-29 DIAGNOSIS — I82411 Acute embolism and thrombosis of right femoral vein: Secondary | ICD-10-CM | POA: Insufficient documentation

## 2021-07-29 DIAGNOSIS — M79605 Pain in left leg: Secondary | ICD-10-CM | POA: Insufficient documentation

## 2021-07-29 DIAGNOSIS — D6862 Lupus anticoagulant syndrome: Secondary | ICD-10-CM | POA: Insufficient documentation

## 2021-07-29 DIAGNOSIS — M7981 Nontraumatic hematoma of soft tissue: Secondary | ICD-10-CM | POA: Diagnosis not present

## 2021-07-29 DIAGNOSIS — Z7901 Long term (current) use of anticoagulants: Secondary | ICD-10-CM | POA: Insufficient documentation

## 2021-07-29 LAB — CBC WITH DIFFERENTIAL/PLATELET
Abs Immature Granulocytes: 0.03 10*3/uL (ref 0.00–0.07)
Basophils Absolute: 0.1 10*3/uL (ref 0.0–0.1)
Basophils Relative: 1 %
Eosinophils Absolute: 0.2 10*3/uL (ref 0.0–0.5)
Eosinophils Relative: 2 %
HCT: 39.2 % (ref 36.0–46.0)
Hemoglobin: 12.6 g/dL (ref 12.0–15.0)
Immature Granulocytes: 0 %
Lymphocytes Relative: 23 %
Lymphs Abs: 2.2 10*3/uL (ref 0.7–4.0)
MCH: 25.5 pg — ABNORMAL LOW (ref 26.0–34.0)
MCHC: 32.1 g/dL (ref 30.0–36.0)
MCV: 79.4 fL — ABNORMAL LOW (ref 80.0–100.0)
Monocytes Absolute: 0.6 10*3/uL (ref 0.1–1.0)
Monocytes Relative: 6 %
Neutro Abs: 6.5 10*3/uL (ref 1.7–7.7)
Neutrophils Relative %: 68 %
Platelets: 220 10*3/uL (ref 150–400)
RBC: 4.94 MIL/uL (ref 3.87–5.11)
RDW: 14.5 % (ref 11.5–15.5)
WBC: 9.7 10*3/uL (ref 4.0–10.5)
nRBC: 0 % (ref 0.0–0.2)

## 2021-07-29 LAB — BASIC METABOLIC PANEL
Anion gap: 11 (ref 5–15)
BUN: 19 mg/dL (ref 6–20)
CO2: 23 mmol/L (ref 22–32)
Calcium: 9.4 mg/dL (ref 8.9–10.3)
Chloride: 104 mmol/L (ref 98–111)
Creatinine, Ser: 1.35 mg/dL — ABNORMAL HIGH (ref 0.44–1.00)
GFR, Estimated: 52 mL/min — ABNORMAL LOW (ref >60)
Glucose, Bld: 102 mg/dL — ABNORMAL HIGH (ref 70–99)
Potassium: 4.4 mmol/L (ref 3.5–5.1)
Sodium: 138 mmol/L (ref 135–145)

## 2021-07-29 LAB — HCG, SERUM, QUALITATIVE: Preg, Serum: NEGATIVE

## 2021-07-29 MED ORDER — IOHEXOL 350 MG/ML SOLN
100.0000 mL | Freq: Once | INTRAVENOUS | Status: AC | PRN
  Administered 2021-07-29: 100 mL via INTRAVENOUS

## 2021-07-29 MED ORDER — OXYCODONE-ACETAMINOPHEN 5-325 MG PO TABS
1.0000 | ORAL_TABLET | Freq: Once | ORAL | Status: AC
  Administered 2021-07-29: 1 via ORAL
  Filled 2021-07-29: qty 1

## 2021-07-29 NOTE — Progress Notes (Signed)
?Hematology and Oncology Follow Up Visit ? ?Nancy Rivera ?[id number] ?[birth year] 36 y.o. ?07/29/2021 ? ? ?Principle Diagnosis:  ?Acute DVT of the femoral vein of the right lower extremity ?Lupus anticoagulant positive ?Prothrombin gene mutation - heterozygous  ?  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  ?Current Therapy:        ?Lovenox 140 mg SQ BID ?  ?Interim History:  Nancy Rivera is here today for an unscheduled visit.  She was going to work a few days ago.  She fell today popping in her left  lower leg.  This would like that she had a blood clot in.  Thankfully, the last Doppler that she had done did not show a blood clot in the left  leg.  She is on Lovenox because she has failed oral anticoagulation. ? ?The pain has gotten worse.  We did get a Doppler of her leg today.  This did not show any thrombus but did show a fluid collection which likely is a hematoma. ? ?She is having a very hard time walking.  There is quite a bit of pain in the right leg.  There is no numbness in the foot or toes. ? ?Unfortunately, I think that we are going to have to get her in.  She is going to need a CT scan of the right lower leg to see about the bleeding. ? ?She is going to have to be off Lovenox.  I am unsure if she needs protamine to reverse the Lovenox. ? ?She has had no cough.  There is no shortness of breath.  She has had no nausea or vomiting. ? ?Overall, I would say performance status is probably ECOG 1.   ? ? ?Medications:  ?Allergies as of 07/29/2021   ?No Known Allergies ?  ? ?  ?Medication List  ?  ? ?  ? Accurate as of Jul 29, 2021  3:05 PM. If you have any questions, ask your nurse or doctor.  ?  ?  ? ?  ? ?STOP taking these medications   ? ?Vitamin D (Ergocalciferol) 1.25 MG (50000 UNIT) Caps capsule ?Commonly known as: DRISDOL ?Stopped by: Volanda Napoleon, MD ?  ? ?  ? ?TAKE these medications   ? ?acetaminophen 500 MG tablet ?Commonly known as:  TYLENOL ?Take 500-1,000 mg by mouth every 6 (six) hours as needed for mild pain or headache. ?  ?acetaminophen 500 MG tablet ?Commonly known as: TYLENOL ?1-2 tablets as needed ?  ?busPIRone 7.5 MG tablet ?Commonly known as: BUSPAR ?Take 7.5 mg by mouth 2 (two) times daily as needed (for anxiety). ?  ?enoxaparin 150 MG/ML injection ?Commonly known as: Lovenox ?Inject 0.94 mLs (140 mg total) into the skin every 12 (twelve) hours. ?  ?hydrOXYzine 10 MG tablet ?Commonly known as: ATARAX ?Take 10 mg by mouth 3 (three) times daily. ?  ?hydrOXYzine 25 MG capsule ?Commonly known as: VISTARIL ?SMARTSIG:0.5-1 Capsule(s) By Mouth 3 Times Daily ?  ?labetalol 200 MG tablet ?Commonly known as: NORMODYNE ?Take 1 tablet (200 mg total) by mouth 3 (three) times daily. ?What changed: when to take this ?  ?oxyCODONE-acetaminophen 5-325 MG tablet ?Commonly known as: PERCOCET/ROXICET ?Take 1 tablet by mouth every 6 (six) hours as needed for severe pain. ?  ?sertraline 50 MG tablet ?Commonly known as: ZOLOFT ?Take 50 mg by mouth at bedtime. ?  ?Slynd 4 MG Tabs ?Generic drug: Drospirenone ?daily. ?  ?  triamcinolone ointment 0.1 % ?Commonly known as: KENALOG ?Apply topically as directed. ?  ? ?  ? ? ?Allergies: No Known Allergies ? ?Past Medical History, Surgical history, Social history, and Family History were reviewed and updated. ? ?Review of Systems: ?Review of Systems  ?Constitutional: Negative.   ?HENT: Negative.    ?Eyes: Negative.   ?Respiratory: Negative.    ?Cardiovascular: Negative.   ?Genitourinary: Negative.   ?Musculoskeletal:  Positive for myalgias.  ?Skin: Negative.   ?Neurological: Negative.   ?Endo/Heme/Allergies:  Bruises/bleeds easily.  ?Psychiatric/Behavioral: Negative.    ? ? ?Physical Exam: ? oral temperature is 98.5 ?F (36.9 ?C). Her blood pressure is 120/71 and her pulse is 86. Her respiration is 20 and oxygen saturation is 98%.  ? ?Wt Readings from Last 3 Encounters:  ?07/06/21 (!) 330 lb (149.7 kg)  ?06/10/21 (!)  334 lb (151.5 kg)  ?04/29/21 (!) 334 lb (151.5 kg)  ? ? ?Physical Exam ?Musculoskeletal:  ?   Comments: Left lower extremity is quite swollen.  It is tight below the knee.  There is tenderness to palpation.  She does have a decent pulse.  She has decent sensation.  ? ? ? ?Lab Results  ?Component Value Date  ? WBC 7.2 07/06/2021  ? HGB 12.2 07/06/2021  ? HCT 38.5 07/06/2021  ? MCV 79.5 (L) 07/06/2021  ? PLT 191 07/06/2021  ? ?No results found for: FERRITIN, IRON, TIBC, UIBC, IRONPCTSAT ?Lab Results  ?Component Value Date  ? RBC 4.84 07/06/2021  ? ?No results found for: KPAFRELGTCHN, LAMBDASER, KAPLAMBRATIO ?No results found for: IGGSERUM, IGA, IGMSERUM ?No results found for: TOTALPROTELP, ALBUMINELP, A1GS, A2GS, BETS, BETA2SER, GAMS, MSPIKE, SPEI ?  Chemistry   ?   ?Component Value Date/Time  ? NA 138 07/06/2021 0943  ? K 4.3 07/06/2021 0943  ? CL 105 07/06/2021 0943  ? CO2 24 07/06/2021 0943  ? BUN 19 07/06/2021 0943  ? CREATININE 1.26 (H) 07/06/2021 0943  ?    ?Component Value Date/Time  ? CALCIUM 9.1 07/06/2021 0943  ? ALKPHOS 51 07/06/2021 0943  ? AST 20 07/06/2021 0943  ? ALT 41 07/06/2021 0943  ? BILITOT 0.4 07/06/2021 0943  ?  ? ? ? ?Impression and Plan: Nancy Rivera is a very pleasant 36 yo caucasian female with chronic almost occlusive right lower extremity DVT.  She is been on Lovenox for this. ? ?Again, the last Doppler did not show any problems with acute thrombus..  She now has issues with her left leg.  She has a hematoma in the left leg. ? ?I think she got had to be admitted.  She is going to have to be watched out for compartment syndrome. ? ?I think she will need to have a CT of the leg. ? ?Again she can had to be off of anticoagulation with the Lovenox.  Thankfully, I think we have flexibility for this. ? ?We will have to get her into the hospital.  To go to the emergency room. ? ?We will find her in the hospital when she is admitted. ? ? ?Volanda Napoleon, MD ?5/5/20233:05 PM ? ?

## 2021-07-29 NOTE — ED Provider Notes (Signed)
?[facility] ?Provider Note ? ? ?CSN: [id number] ?Arrival date & time: 07/29/21  1537 ? ?  ? ?History ? ?Chief Complaint  ?Patient presents with  ? Leg Pain  ?  Left   ? ? ?Nancy Rivera is a 36 y.o. female.  She has a history of DVT and is on Lovenox for failed oral anticoagulation.  Complaining of acute pain and left calf while walking.  Said felt like a cramp.  Has been getting progressively worse.  She saw her oncologist today who ordered her an ultrasound that did not show any acute DVT but did show possible hematoma.  Was sent to the emergency department for evaluation of possible active bleeding/compartment syndrome.  Patient has no distal numbness or weakness.  No chest pain or shortness of breath.  No clear trauma.  There is pain at rest but worse with movement. ? ?The history is provided by the patient.  ?Leg Pain ?Location:  Leg ?Time since incident:  4 days ?Leg location:  L lower leg ?Pain details:  ?  Quality:  Aching ?  Severity:  Moderate ?  Onset quality:  Gradual ?  Timing:  Constant ?  Progression:  Worsening ?Chronicity:  New ?Relieved by:  Nothing ?Worsened by:  Bearing weight ?Ineffective treatments:  Elevation and rest ?Associated symptoms: no fever, no numbness and no tingling   ? ?  ? ?Home Medications ?Prior to Admission medications   ?Medication Sig Start Date End Date Taking? Authorizing Provider  ?acetaminophen (TYLENOL) 500 MG tablet Take 500-1,000 mg by mouth every 6 (six) hours as needed for mild pain or headache. ?Patient not taking: Reported on 07/06/2021    [provider]  ?acetaminophen (TYLENOL) 500 MG tablet 1-2 tablets as needed    [provider]  ?busPIRone (BUSPAR) 7.5 MG tablet Take 7.5 mg by mouth 2 (two) times daily as needed (for anxiety).    [provider]  ?Drospirenone (SLYND) 4 MG TABS daily.    [provider]  ?enoxaparin (LOVENOX) 150 MG/ML injection Inject 0.94 mLs (140 mg total) into the skin  every 12 (twelve) hours. 04/15/21   Celso Amy, NP  ?hydrOXYzine (ATARAX) 10 MG tablet Take 10 mg by mouth 3 (three) times daily. ?Patient not taking: Reported on 07/06/2021 11/03/20   [provider]  ?hydrOXYzine (VISTARIL) 25 MG capsule SMARTSIG:0.5-1 Capsule(s) By Mouth 3 Times Daily ?Patient not taking: Reported on 07/06/2021 11/03/20   [provider]  ?labetalol (NORMODYNE) 200 MG tablet Take 1 tablet (200 mg total) by mouth 3 (three) times daily. ?Patient taking differently: Take 200 mg by mouth 2 (two) times daily. 07/08/20   Everlene Farrier, MD  ?oxyCODONE-acetaminophen (PERCOCET/ROXICET) 5-325 MG tablet Take 1 tablet by mouth every 6 (six) hours as needed for severe pain. ?Patient not taking: Reported on 07/06/2021 06/10/21   Eustaquio Maize, PA-C  ?sertraline (ZOLOFT) 50 MG tablet Take 50 mg by mouth at bedtime.    [provider]  ?triamcinolone ointment (KENALOG) 0.1 % Apply topically as directed. 11/03/20   [provider]  ?   ? ?Allergies    ?Patient has no known allergies.   ? ?Review of Systems   ?Review of Systems  ?Constitutional:  Negative for fever.  ?HENT:  Negative for sore throat.   ?Eyes:  Negative for visual disturbance.  ?Respiratory:  Negative for shortness of breath.   ?Cardiovascular:  Positive for leg swelling. Negative for chest pain.  ?Gastrointestinal:  Negative for  abdominal pain.  ?Genitourinary:  Negative for dysuria.  ?Musculoskeletal:  Positive for gait problem.  ?Skin:  Negative for rash.  ? ?Physical Exam ?Updated Vital Signs ?BP (!) 128/92 (BP Location: Right Arm)   Pulse 84   Temp 98.7 ?F (37.1 ?C)   Resp 18   Ht '5\' 9"'$  (1.753 m)   Wt (!) 149.7 kg   SpO2 100%   BMI 48.73 kg/m?  ?Physical Exam ?Vitals and nursing note reviewed.  ?Constitutional:   ?   General: She is not in acute distress. ?   Appearance: Normal appearance. She is well-developed.  ?HENT:  ?   Head: Normocephalic and atraumatic.  ?Eyes:  ?   Conjunctiva/sclera:  Conjunctivae normal.  ?Cardiovascular:  ?   Rate and Rhythm: Normal rate and regular rhythm.  ?   Heart sounds: No murmur heard. ?Pulmonary:  ?   Effort: Pulmonary effort is normal. No respiratory distress.  ?   Breath sounds: Normal breath sounds.  ?Abdominal:  ?   Palpations: Abdomen is soft.  ?   Tenderness: There is no abdominal tenderness.  ?Musculoskeletal:     ?   General: Tenderness present. No swelling.  ?   Cervical back: Neck supple.  ?   Left lower leg: Edema present.  ?   Comments: She has tenderness posterior gastroc.  There is palpable induration in this area.  Distal to this the compartments are soft.  Distal pulses motor and sensation intact.  ?Skin: ?   General: Skin is warm and dry.  ?   Capillary Refill: Capillary refill takes less than 2 seconds.  ?Neurological:  ?   General: No focal deficit present.  ?   Mental Status: She is alert.  ?   Sensory: No sensory deficit.  ?   Motor: No weakness.  ? ? ?ED Results / Procedures / Treatments   ?Labs ?(all labs ordered are listed, but only abnormal results are displayed) ?Labs Reviewed  ?BASIC METABOLIC PANEL - Abnormal; Notable for the following components:  ?    Result Value  ? Glucose, Bld 102 (*)   ? Creatinine, Ser 1.35 (*)   ? GFR, Estimated 52 (*)   ? All other components within normal limits  ?CBC WITH DIFFERENTIAL/PLATELET - Abnormal; Notable for the following components:  ? MCV 79.4 (*)   ? MCH 25.5 (*)   ? All other components within normal limits  ?HCG, SERUM, QUALITATIVE  ? ? ?EKG ?None ? ?Radiology ?CT ANGIO LOW EXTREM LEFT W &/OR WO CONTRAST ? ?Result Date: 07/29/2021 ?CLINICAL DATA:  Soft tissue mass, lower leg, vascular mass suspected concern for active bleeding left calf EXAM: CT ANGIOGRAPHY OF THE LEFT LOWER EXTREMITY TECHNIQUE: Multidetector CT imaging of the left lower extremity was performed using the standard protocol during bolus administration of intravenous contrast. Multiplanar CT image reconstructions and MIPs were obtained to  evaluate the vascular anatomy. RADIATION DOSE REDUCTION: This exam was performed according to the departmental dose-optimization program which includes automated exposure control, adjustment of the mA and/or kV according to patient size and/or use of iterative reconstruction technique. CONTRAST:  164m OMNIPAQUE IOHEXOL 350 MG/ML SOLN COMPARISON:  Lower extremity duplex performed earlier today FINDINGS: LEFT Lower Extremity Please note that contrast bolus timing is suboptimal for arterial assessment. Inflow: Common, internal and external iliac arteries are patent without evidence of aneurysm, dissection, vasculitis or significant stenosis. Outflow: Please note that detailed assessment is limited due to contrast bolus timing. Allowing for this limitation, the common,  superficial and profunda femoral arteries and the popliteal artery are patent without evidence of aneurysm, dissection, vasculitis or significant stenosis. Runoff: Please note that detailed assessment is limited due to contrast bolus timing. There is evidence of three-vessel runoff to the ankle. No evidence of active arterial extravasation. Veins: No obvious venous abnormality within the limitations of this arterial phase study. Musculoskeletal: There is a heterogeneous fluid collection within the medial head of the gastrocnemius muscle, size measurement and borders are difficult to define on this arterial exam, visualized on series 7 images 257 through 274. Internal density is heterogeneous. Intramuscular stranding extends proximally to the level of the posterior knee joint. No internal air. Mild generalized soft tissue edema about the medial calf. There is a prominent 14 mm popliteal lymph node. Other: No acute intrapelvic findings. Review of the MIP images confirms the above findings. IMPRESSION: 1. Limited CTA of the left lower extremity due to contrast bolus timing. Allowing for limitations, no active extravasation or acute arterial abnormalities  are seen. 2. Heterogeneous fluid collection within the medial head of the gastrocnemius. This may represent hematoma when compared with ultrasound earlier today. Size evaluation is limited on this arterial phase exam, as t

## 2021-07-29 NOTE — Discharge Instructions (Signed)
You were seen in the emergency department for worsening of left lower leg pain.  Your ultrasound did not show any evidence of a blood clot.  They did see concerns for hematoma.  You had blood work and a CAT scan that did not show any evidence of active bleeding in this area.  This is likely a tear of the muscle and some bleeding into that area.  At this time there is no evidence of a compartment syndrome although we are giving you instructions about what to watch out for.  You should ice the area and elevate.  Tylenol as needed for pain.  Follow-up with your doctor.  Return to the emergency department if any worsening or concerning symptoms. ?

## 2021-07-29 NOTE — ED Triage Notes (Signed)
Pt reports left leg DVT shown today in [place] . Sent for treatment  ?

## 2021-07-30 ENCOUNTER — Encounter: Payer: Self-pay | Admitting: Hematology & Oncology

## 2021-08-01 ENCOUNTER — Other Ambulatory Visit: Payer: Self-pay | Admitting: *Deleted

## 2021-08-01 DIAGNOSIS — I82401 Acute embolism and thrombosis of unspecified deep veins of right lower extremity: Secondary | ICD-10-CM

## 2021-08-01 DIAGNOSIS — I82411 Acute embolism and thrombosis of right femoral vein: Secondary | ICD-10-CM

## 2021-08-01 MED ORDER — TRAMADOL HCL 50 MG PO TABS
50.0000 mg | ORAL_TABLET | Freq: Four times a day (QID) | ORAL | 0 refills | Status: DC | PRN
Start: 2021-08-01 — End: 2021-08-01

## 2021-08-01 MED ORDER — TRAMADOL HCL 50 MG PO TABS: 50.0000 mg | ORAL_TABLET | Freq: Four times a day (QID) | ORAL | 0 refills | Status: DC | PRN

## 2021-08-02 ENCOUNTER — Inpatient Hospital Stay: Payer: 59

## 2021-08-02 ENCOUNTER — Other Ambulatory Visit: Payer: Self-pay

## 2021-08-02 ENCOUNTER — Encounter: Payer: Self-pay | Admitting: Family

## 2021-08-02 ENCOUNTER — Inpatient Hospital Stay: Payer: 59 | Admitting: Family

## 2021-08-02 VITALS — BP 116/82 | HR 73 | Resp 18 | Ht 69.0 in | Wt 330.8 lb

## 2021-08-02 DIAGNOSIS — Z79899 Other long term (current) drug therapy: Secondary | ICD-10-CM | POA: Diagnosis not present

## 2021-08-02 DIAGNOSIS — Z86718 Personal history of other venous thrombosis and embolism: Secondary | ICD-10-CM | POA: Diagnosis not present

## 2021-08-02 DIAGNOSIS — D6862 Lupus anticoagulant syndrome: Secondary | ICD-10-CM | POA: Diagnosis not present

## 2021-08-02 DIAGNOSIS — I82411 Acute embolism and thrombosis of right femoral vein: Secondary | ICD-10-CM

## 2021-08-02 DIAGNOSIS — D6859 Other primary thrombophilia: Secondary | ICD-10-CM | POA: Diagnosis not present

## 2021-08-02 DIAGNOSIS — R760 Raised antibody titer: Secondary | ICD-10-CM

## 2021-08-02 DIAGNOSIS — S8012XA Contusion of left lower leg, initial encounter: Secondary | ICD-10-CM | POA: Diagnosis not present

## 2021-08-02 DIAGNOSIS — I82401 Acute embolism and thrombosis of unspecified deep veins of right lower extremity: Secondary | ICD-10-CM

## 2021-08-02 DIAGNOSIS — Z7901 Long term (current) use of anticoagulants: Secondary | ICD-10-CM | POA: Diagnosis not present

## 2021-08-02 DIAGNOSIS — M79605 Pain in left leg: Secondary | ICD-10-CM | POA: Diagnosis not present

## 2021-08-02 LAB — CMP (CANCER CENTER ONLY)
ALT: 33 U/L (ref 0–44)
AST: 26 U/L (ref 15–41)
Albumin: 4.1 g/dL (ref 3.5–5.0)
Alkaline Phosphatase: 63 U/L (ref 38–126)
Anion gap: 7 (ref 5–15)
BUN: 21 mg/dL — ABNORMAL HIGH (ref 6–20)
CO2: 27 mmol/L (ref 22–32)
Calcium: 9.4 mg/dL (ref 8.9–10.3)
Chloride: 104 mmol/L (ref 98–111)
Creatinine: 1.17 mg/dL — ABNORMAL HIGH (ref 0.44–1.00)
GFR, Estimated: >60 mL/min (ref >60)
Glucose, Bld: 100 mg/dL — ABNORMAL HIGH (ref 70–99)
Potassium: 4.6 mmol/L (ref 3.5–5.1)
Sodium: 138 mmol/L (ref 135–145)
Total Bilirubin: 0.4 mg/dL (ref 0.3–1.2)
Total Protein: 6.9 g/dL (ref 6.5–8.1)

## 2021-08-02 LAB — CBC WITH DIFFERENTIAL (CANCER CENTER ONLY)
Abs Immature Granulocytes: 0.04 10*3/uL (ref 0.00–0.07)
Basophils Absolute: 0.1 10*3/uL (ref 0.0–0.1)
Basophils Relative: 1 %
Eosinophils Absolute: 0.3 10*3/uL (ref 0.0–0.5)
Eosinophils Relative: 3 %
HCT: 35 % — ABNORMAL LOW (ref 36.0–46.0)
Hemoglobin: 11.2 g/dL — ABNORMAL LOW (ref 12.0–15.0)
Immature Granulocytes: 1 %
Lymphocytes Relative: 27 %
Lymphs Abs: 2.4 10*3/uL (ref 0.7–4.0)
MCH: 25.5 pg — ABNORMAL LOW (ref 26.0–34.0)
MCHC: 32 g/dL (ref 30.0–36.0)
MCV: 79.5 fL — ABNORMAL LOW (ref 80.0–100.0)
Monocytes Absolute: 0.5 10*3/uL (ref 0.1–1.0)
Monocytes Relative: 6 %
Neutro Abs: 5.5 10*3/uL (ref 1.7–7.7)
Neutrophils Relative %: 62 %
Platelet Count: 232 10*3/uL (ref 150–400)
RBC: 4.4 MIL/uL (ref 3.87–5.11)
RDW: 14 % (ref 11.5–15.5)
WBC Count: 8.7 10*3/uL (ref 4.0–10.5)
nRBC: 0 % (ref 0.0–0.2)

## 2021-08-02 LAB — D-DIMER, QUANTITATIVE: D-Dimer, Quant: 1.52 ug/mL-FEU — ABNORMAL HIGH (ref 0.00–0.50)

## 2021-08-02 NOTE — Progress Notes (Signed)
?Hematology and Oncology Follow Up Visit ? ?Nancy Rivera ?[id number] ?09/01/1985 36 y.o. ?08/02/2021 ? ? ?Principle Diagnosis:  ?Acute DVT of the femoral vein of the right lower extremity ?Lupus anticoagulant positive ?Prothrombin gene mutation - heterozygous  ?  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  ?Current Therapy:        ?Lovenox 140 mg SQ BID ?  ?Interim History:  Nancy Rivera is here today for follow-up after going to the ED last week for persistent pain and swelling in the left leg. US showed a hematoma in the left calf. She stopped her Lovenox on Friday and notes that the pain and swelling in the left leg are better.  ?She is able to walk again without the pain in her left foot.  ?No new falls or syncope to report.  ?No fever, chills, n/v, cough, rash, dizziness, SOB, chest pain, palpitations, abdominal pain or changes in bowel or bladder habits.  ?No numbness or tingling in her extremities at this time.  ?She has been eating well and doing her best to stay well hydrated. Her weight is stable at 330 lbs.  ? ?ECOG Performance Status: 1 - Symptomatic but completely ambulatory ? ?Medications:  ?Allergies as of 08/02/2021   ?No Known Allergies ?  ? ?  ?Medication List  ?  ? ?  ? Accurate as of Aug 02, 2021 11:17 AM. If you have any questions, ask your nurse or doctor.  ?  ?  ? ?  ? ?STOP taking these medications   ? ?hydrOXYzine 10 MG tablet ?Commonly known as: ATARAX ?Stopped by: Lottie Dawson, NP ?  ?hydrOXYzine 25 MG capsule ?Commonly known as: VISTARIL ?Stopped by: Lottie Dawson, NP ?  ? ?  ? ?TAKE these medications   ? ?acetaminophen 500 MG tablet ?Commonly known as: TYLENOL ?1-2 tablets as needed ?What changed: Another medication with the same name was removed. Continue taking this medication, and follow the directions you see here. ?Changed by: Lottie Dawson, NP ?  ?busPIRone 7.5 MG tablet ?Commonly known as: BUSPAR ?Take 7.5 mg by mouth 2 (two)  times daily as needed (for anxiety). ?  ?enoxaparin 150 MG/ML injection ?Commonly known as: Lovenox ?Inject 0.94 mLs (140 mg total) into the skin every 12 (twelve) hours. ?  ?labetalol 200 MG tablet ?Commonly known as: NORMODYNE ?Take 1 tablet (200 mg total) by mouth 3 (three) times daily. ?What changed: when to take this ?  ?oxyCODONE-acetaminophen 5-325 MG tablet ?Commonly known as: PERCOCET/ROXICET ?Take 1 tablet by mouth every 6 (six) hours as needed for severe pain. ?  ?sertraline 50 MG tablet ?Commonly known as: ZOLOFT ?Take 50 mg by mouth at bedtime. ?  ?Slynd 4 MG Tabs ?Generic drug: Drospirenone ?daily. ?  ?traMADol 50 MG tablet ?Commonly known as: ULTRAM ?Take 1 tablet (50 mg total) by mouth every 6 (six) hours as needed. ?  ?triamcinolone ointment 0.1 % ?Commonly known as: KENALOG ?Apply topically as directed. ?  ? ?  ? ? ?Allergies: No Known Allergies ? ?Past Medical History, Surgical history, Social history, and Family History were reviewed and updated. ? ?Review of Systems: ?All other 10 point review of systems is negative.  ? ?Physical Exam: ? height is '5\' 9"'$  (1.753 m) and weight is 330 lb 12.8 oz (150 kg) (abnormal). Her blood pressure is 116/82 and her pulse is 73. Her respiration is 18 and oxygen saturation is 100%.  ? ?Wt  Readings from Last 3 Encounters:  ?08/02/21 (!) 330 lb 12.8 oz (150 kg)  ?07/29/21 (!) 330 lb (149.7 kg)  ?07/06/21 (!) 330 lb (149.7 kg)  ? ? ?Ocular: Sclerae unicteric, pupils equal, round and reactive to light ?Ear-nose-throat: Oropharynx clear, dentition fair ?Lymphatic: No cervical or supraclavicular adenopathy ?Lungs no rales or rhonchi, good excursion bilaterally ?Heart regular rate and rhythm, no murmur appreciated ?Abd soft, nontender, positive bowel sounds ?MSK no focal spinal tenderness, no joint edema ?Neuro: non-focal, well-oriented, appropriate affect ?Breasts: Deferred  ? ?Lab Results  ?Component Value Date  ? WBC 8.7 08/02/2021  ? HGB 11.2 (L) 08/02/2021  ? HCT  35.0 (L) 08/02/2021  ? MCV 79.5 (L) 08/02/2021  ? PLT 232 08/02/2021  ? ?No results found for: FERRITIN, IRON, TIBC, UIBC, IRONPCTSAT ?Lab Results  ?Component Value Date  ? RBC 4.40 08/02/2021  ? ?No results found for: KPAFRELGTCHN, LAMBDASER, KAPLAMBRATIO ?No results found for: IGGSERUM, IGA, IGMSERUM ?No results found for: TOTALPROTELP, ALBUMINELP, A1GS, A2GS, BETS, BETA2SER, GAMS, MSPIKE, SPEI ?  Chemistry   ?   ?Component Value Date/Time  ? NA 138 07/29/2021 1603  ? K 4.4 07/29/2021 1603  ? CL 104 07/29/2021 1603  ? CO2 23 07/29/2021 1603  ? BUN 19 07/29/2021 1603  ? CREATININE 1.35 (H) 07/29/2021 1603  ? CREATININE 1.26 (H) 07/06/2021 0943  ?    ?Component Value Date/Time  ? CALCIUM 9.4 07/29/2021 1603  ? ALKPHOS 51 07/06/2021 0943  ? AST 20 07/06/2021 0943  ? ALT 41 07/06/2021 0943  ? BILITOT 0.4 07/06/2021 0943  ?  ? ? ? ?Impression and Plan: Nancy Rivera is a very pleasant 36 yo caucasian female with chronic almost occlusive right lower extremity DVT.  ?She now has a hematoma in the left calf.  ?She will continue to hold her Lovenox for 1 more week and restart next Tuesday. She verbalized understanding.  ?Follow-up in 1 month.  ? ?Lottie Dawson, NP ?5/9/202311:17 AM  ?

## 2021-08-04 LAB — LUPUS ANTICOAGULANT PANEL
DRVVT: 53.9 s — ABNORMAL HIGH (ref 0.0–47.0)
PTT Lupus Anticoagulant: 42.8 s (ref 0.0–43.5)

## 2021-08-04 LAB — DRVVT MIX: dRVVT Mix: 48 s — ABNORMAL HIGH (ref 0.0–40.4)

## 2021-08-04 LAB — DRVVT CONFIRM: dRVVT Confirm: 1.3 ratio — ABNORMAL HIGH (ref 0.8–1.2)

## 2021-08-23 ENCOUNTER — Telehealth: Payer: Self-pay | Admitting: *Deleted

## 2021-08-23 ENCOUNTER — Other Ambulatory Visit: Payer: Self-pay | Admitting: Family

## 2021-08-23 DIAGNOSIS — M79605 Pain in left leg: Secondary | ICD-10-CM

## 2021-08-23 NOTE — Telephone Encounter (Signed)
Message received from patient stating that she has new pain to her left calf and would like a return call.  Call placed back to patient and patient states that her left lower leg is sensitive and sore, is not swollen and is not hot to touch.  Vale Haven NP notified and order received for pt to have an [place] of left leg.  Order placed and pt notified.

## 2021-08-24 ENCOUNTER — Ambulatory Visit (HOSPITAL_BASED_OUTPATIENT_CLINIC_OR_DEPARTMENT_OTHER)
Admission: RE | Admit: 2021-08-24 | Discharge: 2021-08-24 | Disposition: A | Discharge status: Home / Self Care | Payer: 59 | Source: Ambulatory Visit | Attending: Family | Admitting: Family

## 2021-08-24 DIAGNOSIS — M79605 Pain in left leg: Secondary | ICD-10-CM | POA: Diagnosis present

## 2021-08-25 ENCOUNTER — Telehealth: Payer: Self-pay | Admitting: *Deleted

## 2021-08-25 NOTE — Telephone Encounter (Signed)
Per Dr. Marin Olp, I called and informed patient that she does NOT have a DVT within the left lower extremity. This message was left on her cell phone. Instructed her to call the office if she had any questions or concerns.

## 2021-08-30 ENCOUNTER — Telehealth: Payer: Self-pay | Admitting: *Deleted

## 2021-08-30 NOTE — Telephone Encounter (Signed)
This nurse returned patient's phone call regarding the US performed on 5/5 and 5/31. Patient asked about the measurements and thought that the hematoma was bigger on the 5/31 [place]. Patient stated,"how can the hematoma be dissolving when the measurements are bigger?" I read her the addendum that Radiology went back and looked at. She verbalized understanding.

## 2021-09-02 ENCOUNTER — Other Ambulatory Visit: Payer: Self-pay

## 2021-09-02 ENCOUNTER — Inpatient Hospital Stay: Payer: 59 | Attending: Hematology & Oncology

## 2021-09-02 ENCOUNTER — Encounter: Payer: Self-pay | Admitting: Family

## 2021-09-02 ENCOUNTER — Other Ambulatory Visit: Payer: Self-pay | Admitting: Oncology

## 2021-09-02 ENCOUNTER — Inpatient Hospital Stay: Payer: 59 | Admitting: Family

## 2021-09-02 VITALS — BP 132/79 | HR 70 | Temp 98.5°F | Resp 18 | Ht 69.0 in | Wt 324.8 lb

## 2021-09-02 DIAGNOSIS — R760 Raised antibody titer: Secondary | ICD-10-CM | POA: Diagnosis not present

## 2021-09-02 DIAGNOSIS — D6859 Other primary thrombophilia: Secondary | ICD-10-CM

## 2021-09-02 DIAGNOSIS — Z79899 Other long term (current) drug therapy: Secondary | ICD-10-CM | POA: Insufficient documentation

## 2021-09-02 DIAGNOSIS — Z86718 Personal history of other venous thrombosis and embolism: Secondary | ICD-10-CM | POA: Diagnosis not present

## 2021-09-02 DIAGNOSIS — I82411 Acute embolism and thrombosis of right femoral vein: Secondary | ICD-10-CM | POA: Diagnosis present

## 2021-09-02 DIAGNOSIS — D6862 Lupus anticoagulant syndrome: Secondary | ICD-10-CM | POA: Diagnosis not present

## 2021-09-02 DIAGNOSIS — I82401 Acute embolism and thrombosis of unspecified deep veins of right lower extremity: Secondary | ICD-10-CM | POA: Diagnosis not present

## 2021-09-02 DIAGNOSIS — Z7901 Long term (current) use of anticoagulants: Secondary | ICD-10-CM | POA: Insufficient documentation

## 2021-09-02 LAB — CMP (CANCER CENTER ONLY)
ALT: 23 U/L (ref 0–44)
AST: 17 U/L (ref 15–41)
Albumin: 4.4 g/dL (ref 3.5–5.0)
Alkaline Phosphatase: 57 U/L (ref 38–126)
Anion gap: 7 (ref 5–15)
BUN: 21 mg/dL — ABNORMAL HIGH (ref 6–20)
CO2: 25 mmol/L (ref 22–32)
Calcium: 9.7 mg/dL (ref 8.9–10.3)
Chloride: 105 mmol/L (ref 98–111)
Creatinine: 1.44 mg/dL — ABNORMAL HIGH (ref 0.44–1.00)
GFR, Estimated: 48 mL/min — ABNORMAL LOW (ref >60)
Glucose, Bld: 94 mg/dL (ref 70–99)
Potassium: 4.8 mmol/L (ref 3.5–5.1)
Sodium: 137 mmol/L (ref 135–145)
Total Bilirubin: 0.3 mg/dL (ref 0.3–1.2)
Total Protein: 6.9 g/dL (ref 6.5–8.1)

## 2021-09-02 LAB — CBC WITH DIFFERENTIAL (CANCER CENTER ONLY)
Abs Immature Granulocytes: 0.04 10*3/uL (ref 0.00–0.07)
Basophils Absolute: 0.1 10*3/uL (ref 0.0–0.1)
Basophils Relative: 1 %
Eosinophils Absolute: 0.1 10*3/uL (ref 0.0–0.5)
Eosinophils Relative: 1 %
HCT: 40.7 % (ref 36.0–46.0)
Hemoglobin: 12.8 g/dL (ref 12.0–15.0)
Immature Granulocytes: 1 %
Lymphocytes Relative: 33 %
Lymphs Abs: 2.7 10*3/uL (ref 0.7–4.0)
MCH: 25.3 pg — ABNORMAL LOW (ref 26.0–34.0)
MCHC: 31.4 g/dL (ref 30.0–36.0)
MCV: 80.4 fL (ref 80.0–100.0)
Monocytes Absolute: 0.4 10*3/uL (ref 0.1–1.0)
Monocytes Relative: 4 %
Neutro Abs: 5.1 10*3/uL (ref 1.7–7.7)
Neutrophils Relative %: 60 %
Platelet Count: 202 10*3/uL (ref 150–400)
RBC: 5.06 MIL/uL (ref 3.87–5.11)
RDW: 14.9 % (ref 11.5–15.5)
WBC Count: 8.4 10*3/uL (ref 4.0–10.5)
nRBC: 0 % (ref 0.0–0.2)

## 2021-09-02 LAB — D-DIMER, QUANTITATIVE: D-Dimer, Quant: 0.27 ug/mL-FEU (ref 0.00–0.50)

## 2021-09-02 NOTE — Progress Notes (Signed)
Hematology and Oncology Follow Up Visit  Nancy Rivera [id number] 02-06-1986 36 y.o. 09/02/2021   Principle Diagnosis:  Acute DVT of the femoral vein of the right lower extremity Lupus anticoagulant positive Prothrombin gene mutation - heterozygous    Past Therapy: Xarelto - progression Coumadin - INR not therapeutic  Eliquis - recurrent DVT in RLE   Current Therapy:        Lovenox 140 mg SQ BID   Interim History:  Nancy Rivera is here today for follow-up. She is feeling much better and states that the pain in her left leg has resolved. [place] last week showed improvement overall in the hematoma.  No issue with bleeding. No petechiae.  She has some mild bruising on her abdomen where she injects Lovenox.  No fever, chills, n/v, cough, rash, dizziness, SOB, chest pain, palpitations, abdominal pain or changes in bowel or bladder habits.  No swelling, tenderness, numbness or tingling in her extremities.  No falls or syncope.  Appetite and hydration have been good. Weight is stable at 324 lbs.   ECOG Performance Status: 1 - Symptomatic but completely ambulatory  Medications:  Allergies as of 09/02/2021   No Known Allergies      Medication List        Accurate as of September 02, 2021 12:14 PM. If you have any questions, ask your nurse or doctor.          STOP taking these medications    oxyCODONE-acetaminophen 5-325 MG tablet Commonly known as: PERCOCET/ROXICET Stopped by: Lottie Dawson, NP       TAKE these medications    acetaminophen 500 MG tablet Commonly known as: TYLENOL   buPROPion 150 MG 24 hr tablet Commonly known as: WELLBUTRIN XL Take 150 mg by mouth every morning.   busPIRone 7.5 MG tablet Commonly known as: BUSPAR Take 7.5 mg by mouth 2 (two) times daily as needed (for anxiety).   enoxaparin 150 MG/ML injection Commonly known as: Lovenox Inject 0.94 mLs (140 mg total) into the skin every 12 (twelve) hours.   labetalol 200 MG tablet Commonly known as:  NORMODYNE Take 1 tablet (200 mg total) by mouth 3 (three) times daily. What changed: when to take this   metFORMIN 500 MG tablet Commonly known as: GLUCOPHAGE Take 500 mg by mouth daily.   sertraline 50 MG tablet Commonly known as: ZOLOFT Take 50 mg by mouth at bedtime.   Slynd 4 MG Tabs Generic drug: Drospirenone daily.   traMADol 50 MG tablet Commonly known as: ULTRAM Take 1 tablet (50 mg total) by mouth every 6 (six) hours as needed.   triamcinolone ointment 0.1 % Commonly known as: KENALOG Apply topically as directed.        Allergies: No Known Allergies  Past Medical History, Surgical history, Social history, and Family History were reviewed and updated.  Review of Systems: All other 10 point review of systems is negative.   Physical Exam:  height is '5\' 9"'$  (1.753 m) and weight is 324 lb 12.8 oz (147.3 kg) (abnormal). Her oral temperature is 98.5 F (36.9 C). Her blood pressure is 132/79 and her pulse is 70. Her respiration is 18 and oxygen saturation is 100%.   Wt Readings from Last 3 Encounters:  09/02/21 (!) 324 lb 12.8 oz (147.3 kg)  08/02/21 (!) 330 lb 12.8 oz (150 kg)  07/29/21 (!) 330 lb (149.7 kg)    Ocular: Sclerae unicteric, pupils equal, round and reactive to light Ear-nose-throat: Oropharynx clear, dentition fair Lymphatic:  No cervical or supraclavicular adenopathy Lungs no rales or rhonchi, good excursion bilaterally Heart regular rate and rhythm, no murmur appreciated Abd soft, nontender, positive bowel sounds MSK no focal spinal tenderness, no joint edema Neuro: non-focal, well-oriented, appropriate affect Breasts: Deferred   Lab Results  Component Value Date   WBC 8.4 09/02/2021   HGB 12.8 09/02/2021   HCT 40.7 09/02/2021   MCV 80.4 09/02/2021   PLT 202 09/02/2021   No results found for: "FERRITIN", "IRON", "TIBC", "UIBC", "IRONPCTSAT" Lab Results  Component Value Date   RBC 5.06 09/02/2021   No results found for: "KPAFRELGTCHN",  "LAMBDASER", "KAPLAMBRATIO" No results found for: "IGGSERUM", "IGA", "IGMSERUM" No results found for: "TOTALPROTELP", "ALBUMINELP", "A1GS", "A2GS", "BETS", "BETA2SER", "GAMS", "MSPIKE", "SPEI"   Chemistry      Component Value Date/Time   NA 137 09/02/2021 1021   K 4.8 09/02/2021 1021   CL 105 09/02/2021 1021   CO2 25 09/02/2021 1021   BUN 21 (H) 09/02/2021 1021   CREATININE 1.44 (H) 09/02/2021 1021      Component Value Date/Time   CALCIUM 9.7 09/02/2021 1021   ALKPHOS 57 09/02/2021 1021   AST 17 09/02/2021 1021   ALT 23 09/02/2021 1021   BILITOT 0.3 09/02/2021 1021       Impression and Plan: Nancy Rivera is a very pleasant 36 yo caucasian female with chronic almost occlusive right lower extremity DVT.  Continue same regimen with Lovenox SQ BID.  Follow-up in 1 month.   Lottie Dawson, NP 6/9/202312:14 PM

## 2021-09-03 LAB — DRVVT MIX: dRVVT Mix: 45.6 s — ABNORMAL HIGH (ref 0.0–40.4)

## 2021-09-03 LAB — DRVVT CONFIRM: dRVVT Confirm: 1.1 ratio (ref 0.8–1.2)

## 2021-09-03 LAB — LUPUS ANTICOAGULANT PANEL
DRVVT: 52.1 s — ABNORMAL HIGH (ref 0.0–47.0)
PTT Lupus Anticoagulant: 58.6 s — ABNORMAL HIGH (ref 0.0–43.5)

## 2021-09-03 LAB — HEXAGONAL PHASE PHOSPHOLIPID: Hexagonal Phase Phospholipid: 7 s (ref 0–11)

## 2021-09-03 LAB — PTT-LA MIX: PTT-LA Mix: 53.3 s — ABNORMAL HIGH (ref 0.0–40.5)

## 2021-09-09 ENCOUNTER — Other Ambulatory Visit: Payer: Self-pay | Admitting: Radiology

## 2021-11-04 ENCOUNTER — Encounter: Payer: Self-pay | Admitting: Family

## 2021-11-04 ENCOUNTER — Inpatient Hospital Stay: Payer: 59 | Attending: Hematology & Oncology

## 2021-11-04 ENCOUNTER — Inpatient Hospital Stay: Payer: 59 | Admitting: Family

## 2021-11-04 ENCOUNTER — Other Ambulatory Visit: Payer: Self-pay

## 2021-11-04 VITALS — BP 120/73 | HR 75 | Temp 97.7°F | Resp 18 | Ht 69.0 in | Wt 335.8 lb

## 2021-11-04 DIAGNOSIS — I82401 Acute embolism and thrombosis of unspecified deep veins of right lower extremity: Secondary | ICD-10-CM

## 2021-11-04 DIAGNOSIS — R760 Raised antibody titer: Secondary | ICD-10-CM

## 2021-11-04 DIAGNOSIS — I82411 Acute embolism and thrombosis of right femoral vein: Secondary | ICD-10-CM | POA: Insufficient documentation

## 2021-11-04 DIAGNOSIS — Z86718 Personal history of other venous thrombosis and embolism: Secondary | ICD-10-CM | POA: Insufficient documentation

## 2021-11-04 DIAGNOSIS — R252 Cramp and spasm: Secondary | ICD-10-CM | POA: Diagnosis not present

## 2021-11-04 DIAGNOSIS — D6862 Lupus anticoagulant syndrome: Secondary | ICD-10-CM | POA: Diagnosis not present

## 2021-11-04 DIAGNOSIS — Z7901 Long term (current) use of anticoagulants: Secondary | ICD-10-CM | POA: Diagnosis not present

## 2021-11-04 DIAGNOSIS — D6859 Other primary thrombophilia: Secondary | ICD-10-CM | POA: Diagnosis not present

## 2021-11-04 LAB — CMP (CANCER CENTER ONLY)
ALT: 37 U/L (ref 0–44)
AST: 19 U/L (ref 15–41)
Albumin: 4.4 g/dL (ref 3.5–5.0)
Alkaline Phosphatase: 68 U/L (ref 38–126)
Anion gap: 9 (ref 5–15)
BUN: 22 mg/dL — ABNORMAL HIGH (ref 6–20)
CO2: 26 mmol/L (ref 22–32)
Calcium: 9.5 mg/dL (ref 8.9–10.3)
Chloride: 103 mmol/L (ref 98–111)
Creatinine: 1.41 mg/dL — ABNORMAL HIGH (ref 0.44–1.00)
GFR, Estimated: 49 mL/min — ABNORMAL LOW (ref >60)
Glucose, Bld: 80 mg/dL (ref 70–99)
Potassium: 4 mmol/L (ref 3.5–5.1)
Sodium: 138 mmol/L (ref 135–145)
Total Bilirubin: 0.4 mg/dL (ref 0.3–1.2)
Total Protein: 6.5 g/dL (ref 6.5–8.1)

## 2021-11-04 LAB — CBC WITH DIFFERENTIAL (CANCER CENTER ONLY)
Abs Immature Granulocytes: 0.04 10*3/uL (ref 0.00–0.07)
Basophils Absolute: 0.1 10*3/uL (ref 0.0–0.1)
Basophils Relative: 1 %
Eosinophils Absolute: 0.1 10*3/uL (ref 0.0–0.5)
Eosinophils Relative: 2 %
HCT: 38.8 % (ref 36.0–46.0)
Hemoglobin: 12.1 g/dL (ref 12.0–15.0)
Immature Granulocytes: 1 %
Lymphocytes Relative: 30 %
Lymphs Abs: 2.2 10*3/uL (ref 0.7–4.0)
MCH: 25.3 pg — ABNORMAL LOW (ref 26.0–34.0)
MCHC: 31.2 g/dL (ref 30.0–36.0)
MCV: 81.2 fL (ref 80.0–100.0)
Monocytes Absolute: 0.4 10*3/uL (ref 0.1–1.0)
Monocytes Relative: 5 %
Neutro Abs: 4.3 10*3/uL (ref 1.7–7.7)
Neutrophils Relative %: 61 %
Platelet Count: 196 10*3/uL (ref 150–400)
RBC: 4.78 MIL/uL (ref 3.87–5.11)
RDW: 14.9 % (ref 11.5–15.5)
WBC Count: 7.1 10*3/uL (ref 4.0–10.5)
nRBC: 0 % (ref 0.0–0.2)

## 2021-11-04 LAB — LACTATE DEHYDROGENASE: LDH: 230 U/L — ABNORMAL HIGH (ref 98–192)

## 2021-11-04 NOTE — Progress Notes (Signed)
Hematology and Oncology Follow Up Visit  Nancy Rivera [id number] 21-May-1985 36 y.o. 11/04/2021   Principle Diagnosis:  Acute DVT of the femoral vein of the right lower extremity Lupus anticoagulant positive Prothrombin gene mutation - heterozygous    Past Therapy: Xarelto - progression Coumadin - INR not therapeutic  Eliquis - recurrent DVT in RLE   Current Therapy:        Lovenox 140 mg SQ BID   Interim History:  Nancy Rivera is here today for follow-up. She is doing well but did have a fall over a baby gate that resulted in significant bruising and a mild cellulitis of the left knee. She is currently on Keflex and the bruising has resolved. No redness or edema noted.  No other bruising. No blood loss or petechiae noted.  No fever, chills, n/v, cough, rash, dizziness, SOB, chest pain, palpitations, abdominal pain or changes in bowel or bladder habits.  No numbness or tingling in her extremities.  She has random cramping in her legs, hands and sides that comes and goes. Work up with PCP has been negative. She is staying well hydrated.  Her appetite is good and weight is stable at 335 lbs.   ECOG Performance Status: 1 - Symptomatic but completely ambulatory  Medications:  Allergies as of 11/04/2021   No Known Allergies      Medication List        Accurate as of November 04, 2021  8:49 AM. If you have any questions, ask your nurse or doctor.          STOP taking these medications    buPROPion 150 MG 24 hr tablet Commonly known as: WELLBUTRIN XL Stopped by: Lottie Dawson, NP   busPIRone 7.5 MG tablet Commonly known as: BUSPAR Stopped by: Lottie Dawson, NP   Slynd 4 MG Tabs Generic drug: Drospirenone Stopped by: Lottie Dawson, NP   traMADol 50 MG tablet Commonly known as: ULTRAM Stopped by: Lottie Dawson, NP       TAKE these medications    acetaminophen 500 MG tablet Commonly known as: TYLENOL   enoxaparin 150 MG/ML injection Commonly known as:  Lovenox Inject 0.94 mLs (140 mg total) into the skin every 12 (twelve) hours.   Keflex 500 MG capsule Generic drug: cephALEXin Take 1 capsule 4 times a day by oral route for 14 days.   labetalol 200 MG tablet Commonly known as: NORMODYNE Take 1 tablet (200 mg total) by mouth 3 (three) times daily. What changed: when to take this   metFORMIN 500 MG tablet Commonly known as: GLUCOPHAGE Take 500 mg by mouth daily.   sertraline 50 MG tablet Commonly known as: ZOLOFT Take 50 mg by mouth at bedtime.   triamcinolone ointment 0.1 % Commonly known as: KENALOG Apply topically as directed.        Allergies: No Known Allergies  Past Medical History, Surgical history, Social history, and Family History were reviewed and updated.  Review of Systems: All other 10 point review of systems is negative.   Physical Exam:  height is '5\' 9"'$  (1.753 m) and weight is 335 lb 12.8 oz (152.3 kg) (abnormal). Her oral temperature is 97.7 F (36.5 C). Her blood pressure is 120/73 and her pulse is 75. Her respiration is 18 and oxygen saturation is 100%.   Wt Readings from Last 3 Encounters:  11/04/21 (!) 335 lb 12.8 oz (152.3 kg)  09/02/21 (!) 324 lb 12.8 oz (147.3 kg)  08/02/21 (!) 330 lb 12.8 oz (150  kg)    Ocular: Sclerae unicteric, pupils equal, round and reactive to light Ear-nose-throat: Oropharynx clear, dentition fair Lymphatic: No cervical or supraclavicular adenopathy Lungs no rales or rhonchi, good excursion bilaterally Heart regular rate and rhythm, no murmur appreciated Abd soft, nontender, positive bowel sounds MSK no focal spinal tenderness, no joint edema Neuro: non-focal, well-oriented, appropriate affect Breasts: Deferred   Lab Results  Component Value Date   WBC 7.1 11/04/2021   HGB 12.1 11/04/2021   HCT 38.8 11/04/2021   MCV 81.2 11/04/2021   PLT 196 11/04/2021   No results found for: "FERRITIN", "IRON", "TIBC", "UIBC", "IRONPCTSAT" Lab Results  Component Value  Date   RBC 4.78 11/04/2021   No results found for: "KPAFRELGTCHN", "LAMBDASER", "KAPLAMBRATIO" No results found for: "IGGSERUM", "IGA", "IGMSERUM" No results found for: "TOTALPROTELP", "ALBUMINELP", "A1GS", "A2GS", "BETS", "BETA2SER", "GAMS", "MSPIKE", "SPEI"   Chemistry      Component Value Date/Time   NA 137 09/02/2021 1021   K 4.8 09/02/2021 1021   CL 105 09/02/2021 1021   CO2 25 09/02/2021 1021   BUN 21 (H) 09/02/2021 1021   CREATININE 1.44 (H) 09/02/2021 1021      Component Value Date/Time   CALCIUM 9.7 09/02/2021 1021   ALKPHOS 57 09/02/2021 1021   AST 17 09/02/2021 1021   ALT 23 09/02/2021 1021   BILITOT 0.3 09/02/2021 1021       Impression and Plan:  Nancy Rivera is a very pleasant 36 yo caucasian female with chronic almost occlusive right lower extremity DVT.  Continue same regimen with Lovenox.  Follow-up in 3 months.   Lottie Dawson, NP 8/11/20238:49 AM

## 2021-11-05 LAB — DRVVT MIX: dRVVT Mix: 44.1 s — ABNORMAL HIGH (ref 0.0–40.4)

## 2021-11-05 LAB — DRVVT CONFIRM: dRVVT Confirm: 1.2 ratio (ref 0.8–1.2)

## 2021-11-05 LAB — HEXAGONAL PHASE PHOSPHOLIPID: Hexagonal Phase Phospholipid: 8 s (ref 0–11)

## 2021-11-05 LAB — LUPUS ANTICOAGULANT PANEL
DRVVT: 57 s — ABNORMAL HIGH (ref 0.0–47.0)
PTT Lupus Anticoagulant: 61.9 s — ABNORMAL HIGH (ref 0.0–43.5)

## 2021-11-05 LAB — PTT-LA MIX: PTT-LA Mix: 54.4 s — ABNORMAL HIGH (ref 0.0–40.5)

## 2021-11-17 ENCOUNTER — Encounter: Payer: Self-pay | Admitting: Family

## 2021-11-18 ENCOUNTER — Ambulatory Visit (HOSPITAL_BASED_OUTPATIENT_CLINIC_OR_DEPARTMENT_OTHER)
Admission: RE | Admit: 2021-11-18 | Discharge: 2021-11-18 | Disposition: A | Discharge status: Home / Self Care | Payer: 59 | Source: Ambulatory Visit | Attending: Hematology & Oncology | Admitting: Hematology & Oncology

## 2021-11-18 ENCOUNTER — Encounter: Payer: Self-pay | Admitting: *Deleted

## 2021-11-18 ENCOUNTER — Other Ambulatory Visit: Payer: Self-pay | Admitting: *Deleted

## 2021-11-18 DIAGNOSIS — R760 Raised antibody titer: Secondary | ICD-10-CM | POA: Diagnosis present

## 2021-11-18 DIAGNOSIS — I82401 Acute embolism and thrombosis of unspecified deep veins of right lower extremity: Secondary | ICD-10-CM | POA: Insufficient documentation

## 2021-11-18 DIAGNOSIS — D6859 Other primary thrombophilia: Secondary | ICD-10-CM | POA: Diagnosis present

## 2021-11-22 ENCOUNTER — Encounter: Payer: Self-pay | Admitting: Family

## 2022-01-11 IMAGING — US US EXTREM LOW VENOUS*R*
1 series · 13 of 24 positions shown · non-contrast
Comparison: 09/02/2020

CLINICAL DATA: 35-year-old with history of right lower extremity
DVT. Follow-up. Continued swelling with anticoagulation.



[Series 1: us venous img lower uni right (dvt) · portal-venous · 13 of 43 slices shown]
[im 1/43]
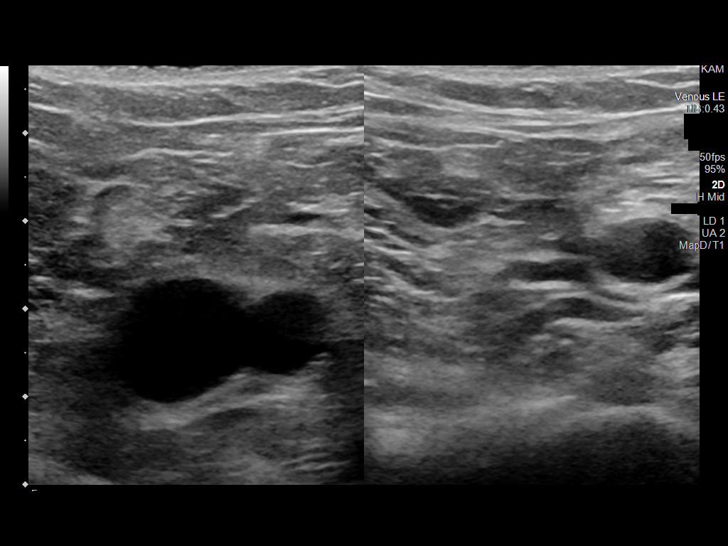
[im 4/43]
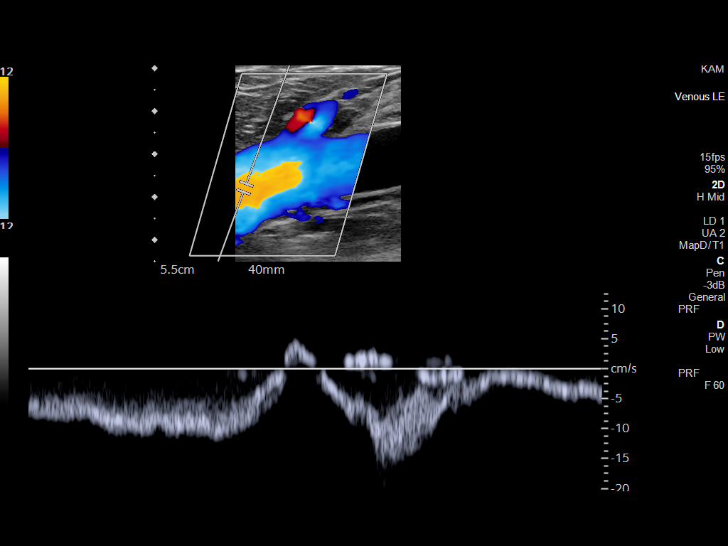
[im 8/43]
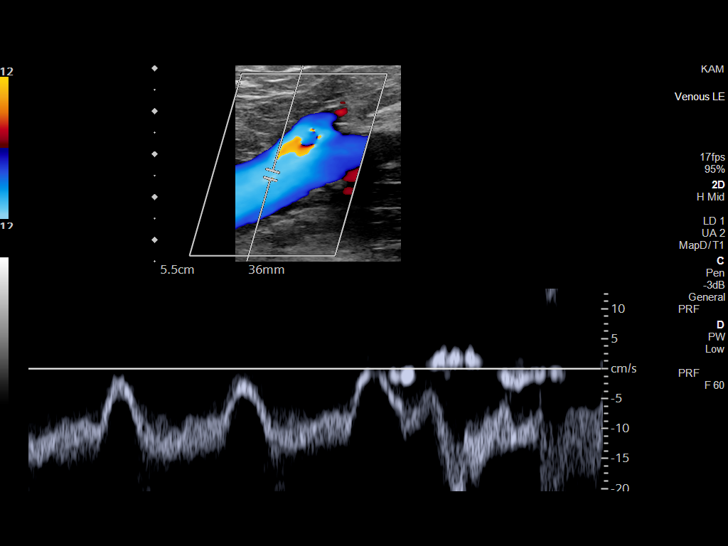
[im 11/43]
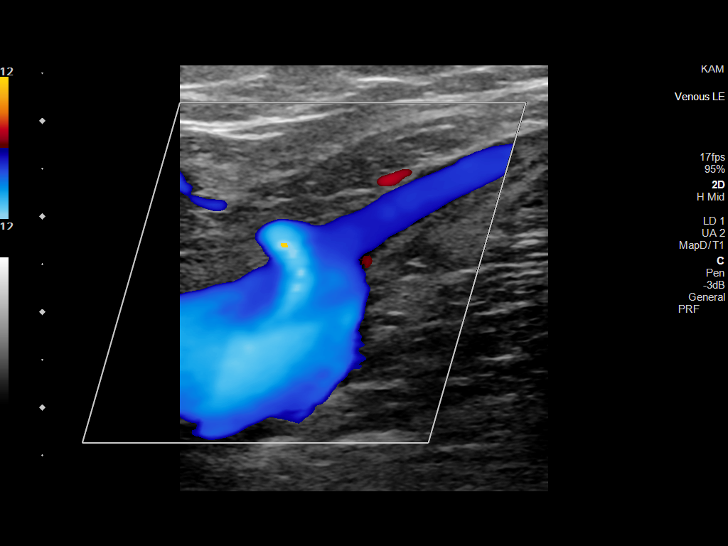
[im 15/43]
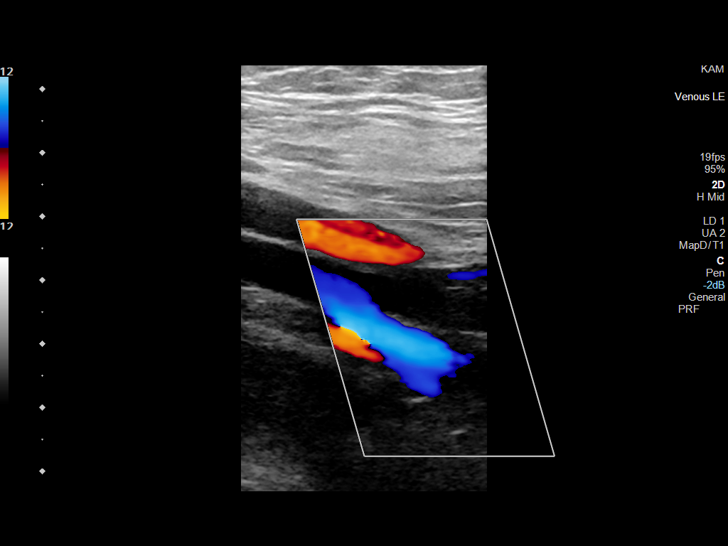
[im 19/43]
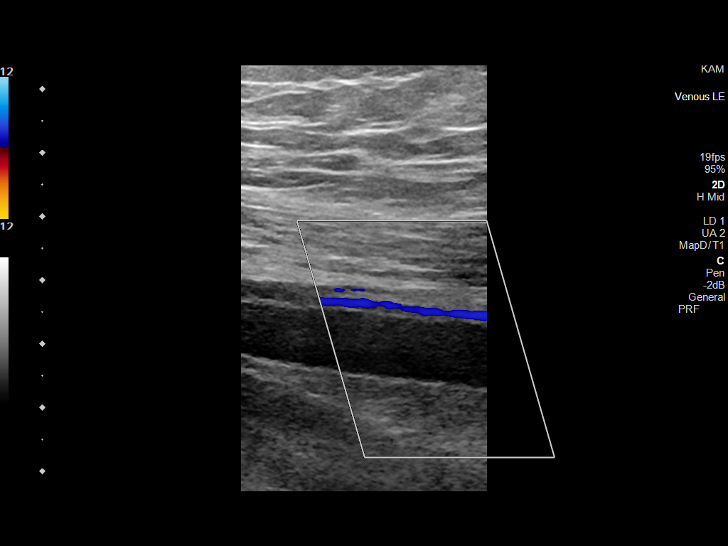
[im 22/43]
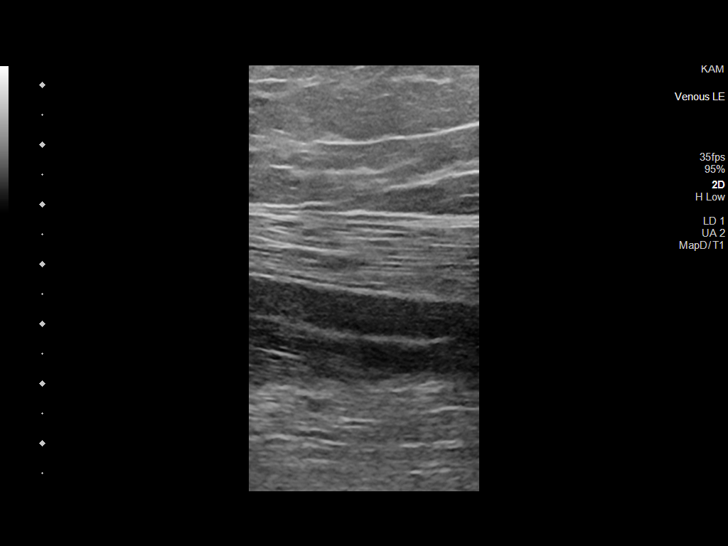
[im 24/43]
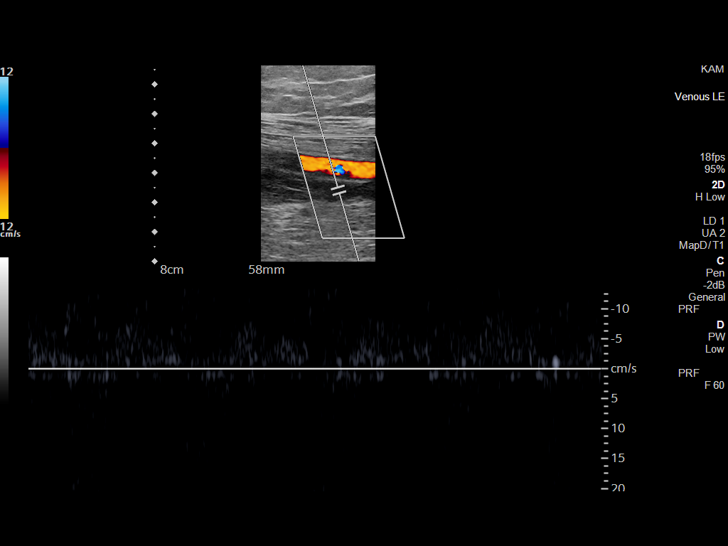
[im 28/43]
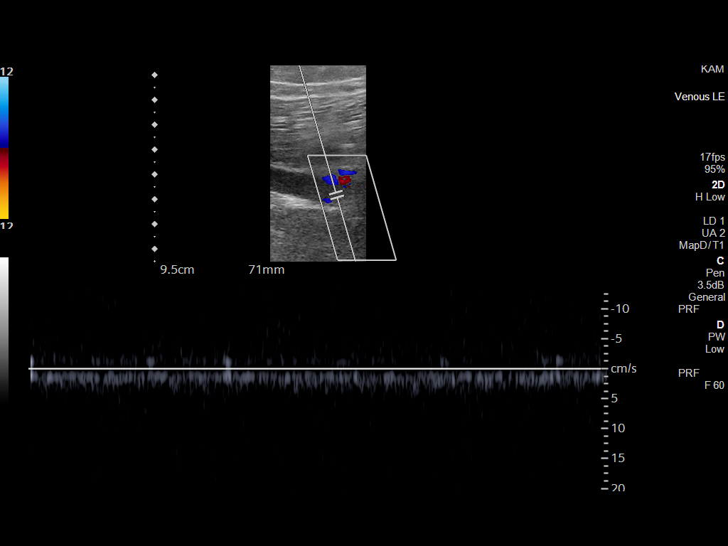
[im 32/43]
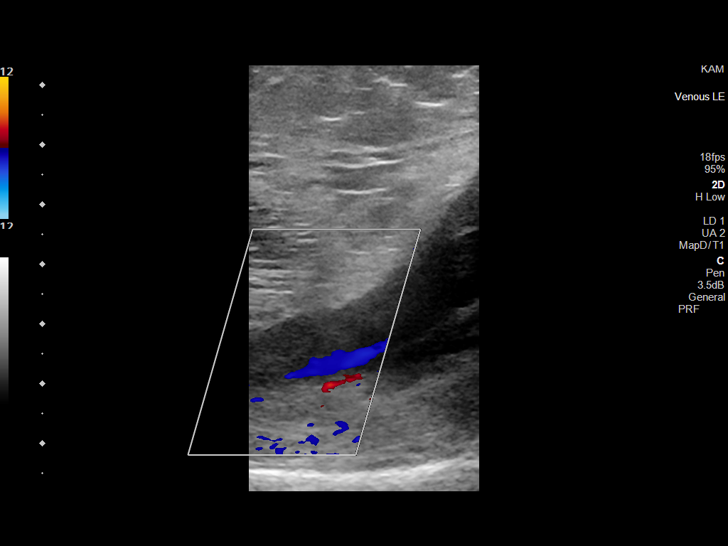
[im 35/43]
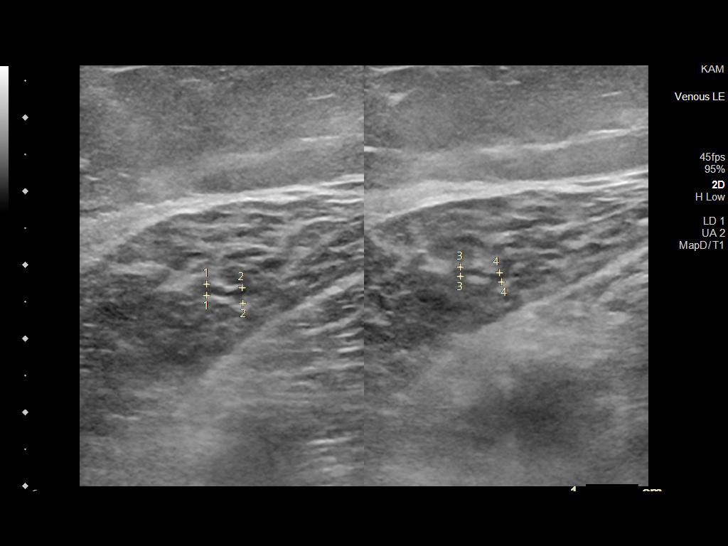
[im 39/43]
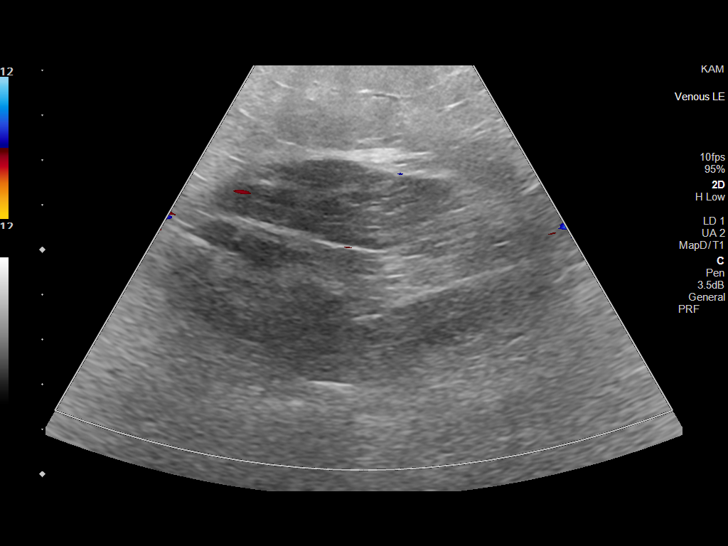
[im 43/43]
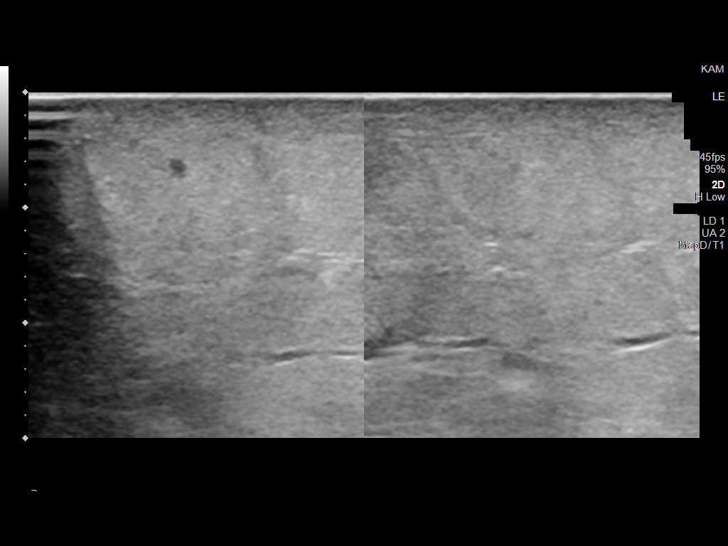

[13 of 24 positions shown; findings below may reference images not displayed]

FINDINGS: Contralateral Common Femoral Vein: Respiratory phasicity is normal
and symmetric with the symptomatic side. No evidence of thrombus.
Normal compressibility.

Common Femoral Vein: No evidence of thrombus. Normal compressibility
and color Doppler flow. Thrombus has resolved from the prior
examination.

Saphenofemoral Junction: No evidence of thrombus. Normal
compressibility and flow on color Doppler imaging.

Profunda Femoral Vein: No evidence of thrombus. Normal
compressibility and flow on color Doppler imaging.

Femoral Vein: Positive for thrombus. Persistent thrombus involving
the proximal, mid and distal femoral vein. Femoral vein thrombus
remains relatively occlusive with minimal color Doppler flow.

Popliteal Vein: Positive for thrombus. Minimal color Doppler flow in
the right popliteal vein.

Calf Veins: Visualized right deep calf veins are patent without
thrombus.

Superficial Great Saphenous Vein: No evidence of thrombus. Normal
compressibility.

Other Findings:  None.
IMPRESSION: Persistent deep venous thrombosis in the right lower extremity.
There is nearly occlusive thrombus still involving the right
popliteal vein and right femoral vein. However, the thrombus in the
right common femoral vein has resolved.

## 2022-02-10 ENCOUNTER — Inpatient Hospital Stay (HOSPITAL_BASED_OUTPATIENT_CLINIC_OR_DEPARTMENT_OTHER): Payer: 59 | Admitting: Family

## 2022-02-10 ENCOUNTER — Encounter: Payer: Self-pay | Admitting: Family

## 2022-02-10 ENCOUNTER — Other Ambulatory Visit: Payer: Self-pay

## 2022-02-10 ENCOUNTER — Inpatient Hospital Stay: Payer: 59 | Attending: Hematology & Oncology

## 2022-02-10 VITALS — BP 117/79 | HR 84 | Temp 97.7°F | Resp 18 | Ht 69.0 in | Wt 315.8 lb

## 2022-02-10 DIAGNOSIS — Z7901 Long term (current) use of anticoagulants: Secondary | ICD-10-CM | POA: Insufficient documentation

## 2022-02-10 DIAGNOSIS — Z79899 Other long term (current) drug therapy: Secondary | ICD-10-CM | POA: Diagnosis not present

## 2022-02-10 DIAGNOSIS — Z86718 Personal history of other venous thrombosis and embolism: Secondary | ICD-10-CM | POA: Diagnosis not present

## 2022-02-10 DIAGNOSIS — D6862 Lupus anticoagulant syndrome: Secondary | ICD-10-CM | POA: Insufficient documentation

## 2022-02-10 DIAGNOSIS — R200 Anesthesia of skin: Secondary | ICD-10-CM | POA: Insufficient documentation

## 2022-02-10 DIAGNOSIS — R760 Raised antibody titer: Secondary | ICD-10-CM

## 2022-02-10 DIAGNOSIS — I82411 Acute embolism and thrombosis of right femoral vein: Secondary | ICD-10-CM | POA: Diagnosis not present

## 2022-02-10 DIAGNOSIS — D509 Iron deficiency anemia, unspecified: Secondary | ICD-10-CM | POA: Diagnosis not present

## 2022-02-10 DIAGNOSIS — R202 Paresthesia of skin: Secondary | ICD-10-CM | POA: Insufficient documentation

## 2022-02-10 DIAGNOSIS — D6859 Other primary thrombophilia: Secondary | ICD-10-CM

## 2022-02-10 DIAGNOSIS — I82401 Acute embolism and thrombosis of unspecified deep veins of right lower extremity: Secondary | ICD-10-CM

## 2022-02-10 LAB — CBC WITH DIFFERENTIAL (CANCER CENTER ONLY)
Abs Immature Granulocytes: 0.04 10*3/uL (ref 0.00–0.07)
Basophils Absolute: 0.1 10*3/uL (ref 0.0–0.1)
Basophils Relative: 1 %
Eosinophils Absolute: 0.1 10*3/uL (ref 0.0–0.5)
Eosinophils Relative: 2 %
HCT: 41.4 % (ref 36.0–46.0)
Hemoglobin: 12.9 g/dL (ref 12.0–15.0)
Immature Granulocytes: 1 %
Lymphocytes Relative: 35 %
Lymphs Abs: 2.6 10*3/uL (ref 0.7–4.0)
MCH: 24.6 pg — ABNORMAL LOW (ref 26.0–34.0)
MCHC: 31.2 g/dL (ref 30.0–36.0)
MCV: 78.9 fL — ABNORMAL LOW (ref 80.0–100.0)
Monocytes Absolute: 0.4 10*3/uL (ref 0.1–1.0)
Monocytes Relative: 5 %
Neutro Abs: 4.3 10*3/uL (ref 1.7–7.7)
Neutrophils Relative %: 56 %
Platelet Count: 205 10*3/uL (ref 150–400)
RBC: 5.25 MIL/uL — ABNORMAL HIGH (ref 3.87–5.11)
RDW: 14.6 % (ref 11.5–15.5)
WBC Count: 7.4 10*3/uL (ref 4.0–10.5)
nRBC: 0 % (ref 0.0–0.2)

## 2022-02-10 LAB — FERRITIN: Ferritin: 23 ng/mL (ref 11–307)

## 2022-02-10 LAB — CMP (CANCER CENTER ONLY)
ALT: 30 U/L (ref 0–44)
AST: 21 U/L (ref 15–41)
Albumin: 4.5 g/dL (ref 3.5–5.0)
Alkaline Phosphatase: 66 U/L (ref 38–126)
Anion gap: 9 (ref 5–15)
BUN: 18 mg/dL (ref 6–20)
CO2: 26 mmol/L (ref 22–32)
Calcium: 9.8 mg/dL (ref 8.9–10.3)
Chloride: 103 mmol/L (ref 98–111)
Creatinine: 1.49 mg/dL — ABNORMAL HIGH (ref 0.44–1.00)
GFR, Estimated: 46 mL/min — ABNORMAL LOW (ref >60)
Glucose, Bld: 91 mg/dL (ref 70–99)
Potassium: 4 mmol/L (ref 3.5–5.1)
Sodium: 138 mmol/L (ref 135–145)
Total Bilirubin: 0.3 mg/dL (ref 0.3–1.2)
Total Protein: 7.1 g/dL (ref 6.5–8.1)

## 2022-02-10 LAB — LACTATE DEHYDROGENASE: LDH: 156 U/L (ref 98–192)

## 2022-02-10 NOTE — Progress Notes (Signed)
Hematology and Oncology Follow Up Visit  Nancy Rivera [id number] [birth year] 36 y.o. 02/10/2022   Principle Diagnosis:  Acute DVT of the femoral vein of the right lower extremity Lupus anticoagulant positive Prothrombin gene mutation - heterozygous    Past Therapy: Xarelto - progression Coumadin - INR not therapeutic  Eliquis - recurrent DVT in RLE   Current Therapy:        Lovenox 140 mg SQ BID   Interim History:  Nancy Rivera is here today for follow-up. She is doing quite well and is tolerating Lovenox 140 mg SQ BID nicely. She has some occasional bruising on arms and legs. No issues at injection sites at this time. She can try taking vitamin C to help prevent bruising.  She has PCOS and has not had a cycle. No petechiae.  No issue with infection. No fever, chills, n/v, cough, rash, dizziness, SOB, chest pain, palpitations, abdominal pain or changes in bowel or bladder habits. She has numbness and tingling in her fingers and toes that waxes and wanes.  No falls or syncope reported.  Appetite and hydration are good. Weight is 315 lbs.  ECOG Performance Status: 1 - Symptomatic but completely ambulatory  Medications:  Allergies as of 02/10/2022   No Known Allergies      Medication List        Accurate as of February 10, 2022  2:48 PM. If you have any questions, ask your nurse or doctor.          STOP taking these medications    Keflex 500 MG capsule Generic drug: cephALEXin Stopped by: Lottie Dawson, NP       TAKE these medications    acetaminophen 500 MG tablet Commonly known as: TYLENOL   enoxaparin 150 MG/ML injection Commonly known as: Lovenox Inject 0.94 mLs (140 mg total) into the skin every 12 (twelve) hours.   labetalol 200 MG tablet Commonly known as: NORMODYNE Take 1 tablet (200 mg total) by mouth 3 (three) times daily. What changed: when to take this   metFORMIN 500 MG tablet Commonly known as: GLUCOPHAGE Take 500 mg by mouth daily.    phentermine 37.5 MG capsule Take 37.5 mg by mouth every morning.   sertraline 50 MG tablet Commonly known as: ZOLOFT Take 50 mg by mouth at bedtime.   topiramate 25 MG tablet Commonly known as: TOPAMAX Take 25 mg by mouth at bedtime.   triamcinolone ointment 0.1 % Commonly known as: KENALOG Apply topically as directed.        Allergies: No Known Allergies  Past Medical History, Surgical history, Social history, and Family History were reviewed and updated.  Review of Systems: All other 10 point review of systems is negative.   Physical Exam:  height is '5\' 9"'$  (1.753 m) and weight is 315 lb 12.8 oz (143.2 kg) (abnormal). Her oral temperature is 97.7 F (36.5 C). Her blood pressure is 117/79 and her pulse is 84. Her respiration is 18 and oxygen saturation is 100%.   Wt Readings from Last 3 Encounters:  02/10/22 (!) 315 lb 12.8 oz (143.2 kg)  11/04/21 (!) 335 lb 12.8 oz (152.3 kg)  09/02/21 (!) 324 lb 12.8 oz (147.3 kg)    Ocular: Sclerae unicteric, pupils equal, round and reactive to light Ear-nose-throat: Oropharynx clear, dentition fair Lymphatic: No cervical or supraclavicular adenopathy Lungs no rales or rhonchi, good excursion bilaterally Heart regular rate and rhythm, no murmur appreciated Abd soft, nontender, positive bowel sounds MSK no focal spinal tenderness,  no joint edema Neuro: non-focal, well-oriented, appropriate affect Breasts: Deferred   Lab Results  Component Value Date   WBC 7.4 02/10/2022   HGB 12.9 02/10/2022   HCT 41.4 02/10/2022   MCV 78.9 (L) 02/10/2022   PLT 205 02/10/2022   No results found for: "FERRITIN", "IRON", "TIBC", "UIBC", "IRONPCTSAT" Lab Results  Component Value Date   RBC 5.25 (H) 02/10/2022   No results found for: "KPAFRELGTCHN", "LAMBDASER", "KAPLAMBRATIO" No results found for: "IGGSERUM", "IGA", "IGMSERUM" No results found for: "TOTALPROTELP", "ALBUMINELP", "A1GS", "A2GS", "BETS", "BETA2SER", "GAMS", "MSPIKE",  "SPEI"   Chemistry      Component Value Date/Time   NA 138 02/10/2022 1411   K 4.0 02/10/2022 1411   CL 103 02/10/2022 1411   CO2 26 02/10/2022 1411   BUN 18 02/10/2022 1411   CREATININE 1.49 (H) 02/10/2022 1411      Component Value Date/Time   CALCIUM 9.8 02/10/2022 1411   ALKPHOS 66 02/10/2022 1411   AST 21 02/10/2022 1411   ALT 30 02/10/2022 1411   BILITOT 0.3 02/10/2022 1411       Impression and Plan: Nancy Rivera is a very pleasant 36 yo caucasian female with chronic almost occlusive right lower extremity DVT.  Continue same regimen with Lovenox 140 mg SQ BID per MD.  Iron studies pending. We will replace if needed.  Can try taking vitamin C PO OTC for bruising.  Follow-up in 4 months.   Lottie Dawson, NP 11/17/20232:48 PM

## 2022-02-13 ENCOUNTER — Encounter: Payer: Self-pay | Admitting: *Deleted

## 2022-02-13 LAB — DRVVT MIX: dRVVT Mix: 46.4 s — ABNORMAL HIGH (ref 0.0–40.4)

## 2022-02-13 LAB — PTT-LA MIX: PTT-LA Mix: 58.2 s — ABNORMAL HIGH (ref 0.0–40.5)

## 2022-02-13 LAB — IRON AND IRON BINDING CAPACITY (CC-WL,HP ONLY)
Iron: 42 ug/dL (ref 28–170)
Saturation Ratios: 9 % — ABNORMAL LOW (ref 10.4–31.8)
TIBC: 476 ug/dL — ABNORMAL HIGH (ref 250–450)
UIBC: 434 ug/dL (ref 148–442)

## 2022-02-13 LAB — LUPUS ANTICOAGULANT PANEL
DRVVT: 56.4 s — ABNORMAL HIGH (ref 0.0–47.0)
PTT Lupus Anticoagulant: 59 s — ABNORMAL HIGH (ref 0.0–43.5)

## 2022-02-13 LAB — DRVVT CONFIRM: dRVVT Confirm: 1.2 ratio (ref 0.8–1.2)

## 2022-02-13 LAB — HEXAGONAL PHASE PHOSPHOLIPID: Hexagonal Phase Phospholipid: 0 s (ref 0–11)

## 2022-02-24 ENCOUNTER — Other Ambulatory Visit: Payer: Self-pay | Admitting: Family

## 2022-02-24 DIAGNOSIS — D6852 Prothrombin gene mutation: Secondary | ICD-10-CM

## 2022-02-24 DIAGNOSIS — R760 Raised antibody titer: Secondary | ICD-10-CM

## 2022-02-24 DIAGNOSIS — I82411 Acute embolism and thrombosis of right femoral vein: Secondary | ICD-10-CM

## 2022-03-02 DIAGNOSIS — S36892A Contusion of other intra-abdominal organs, initial encounter: Secondary | ICD-10-CM

## 2022-03-02 DIAGNOSIS — R58 Hemorrhage, not elsewhere classified: Secondary | ICD-10-CM | POA: Insufficient documentation

## 2022-03-02 HISTORY — DX: Contusion of other intra-abdominal organs, initial encounter: S36.892A

## 2022-03-03 DIAGNOSIS — Z7901 Long term (current) use of anticoagulants: Secondary | ICD-10-CM | POA: Insufficient documentation

## 2022-04-09 IMAGING — US US EXTREM LOW VENOUS*R*
1 series · 13 of 24 positions shown · non-contrast
Comparison: 10/01/2020; 09/02/2020

CLINICAL DATA: Follow-up right lower extremity DVT, currently on
anticoagulation.



[Series 1: us extrem low venous*right* · 13 of 43 slices shown]
[im 1/43]
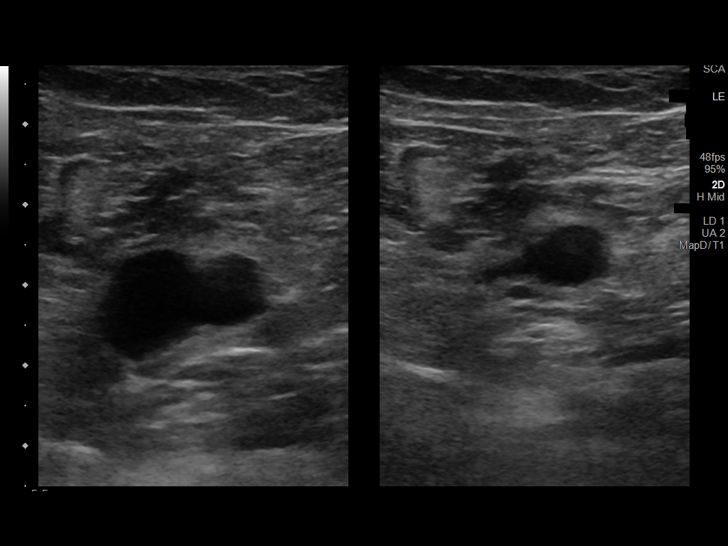
[im 4/43]
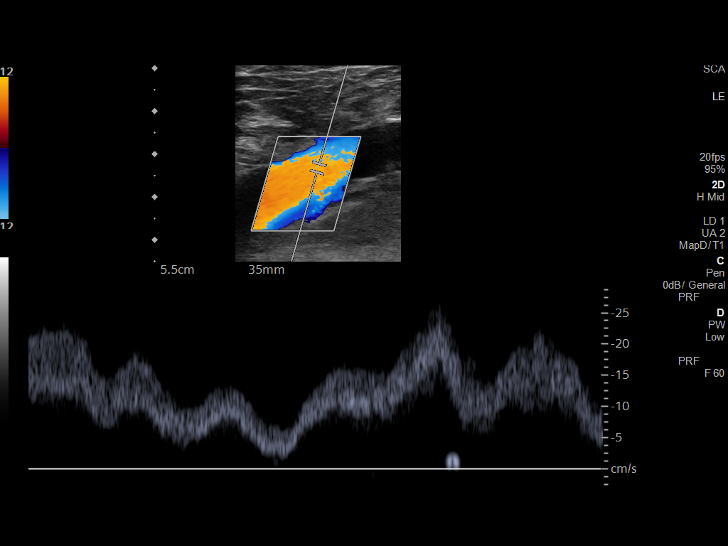
[im 8/43]
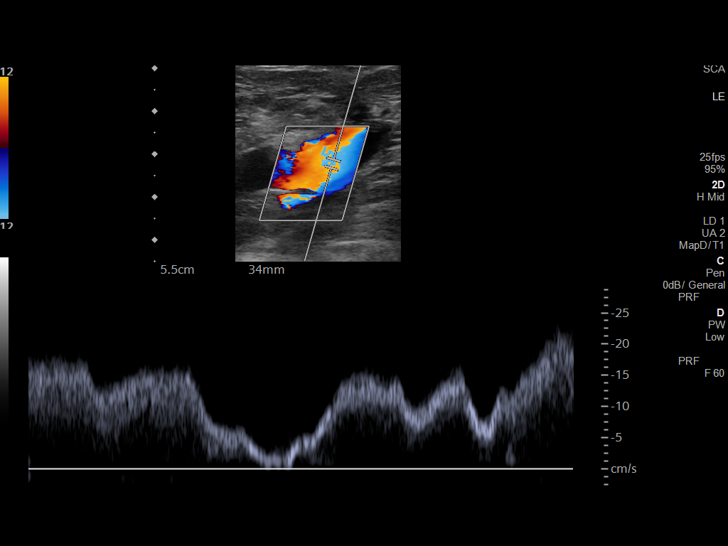
[im 11/43]
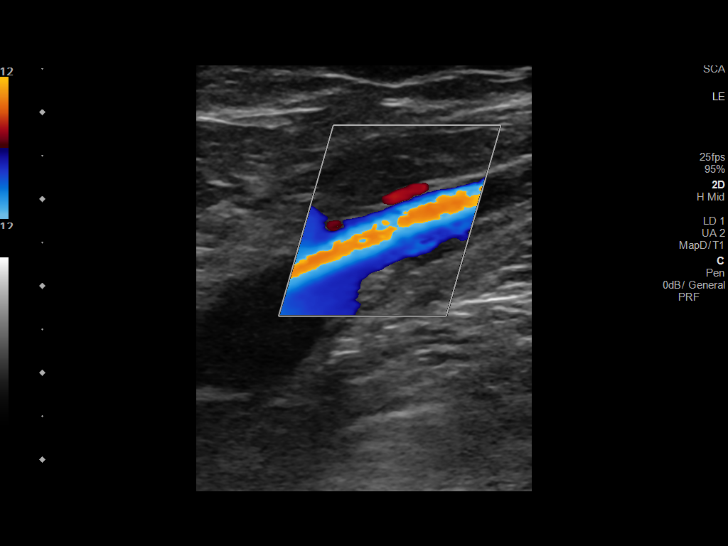
[im 15/43]
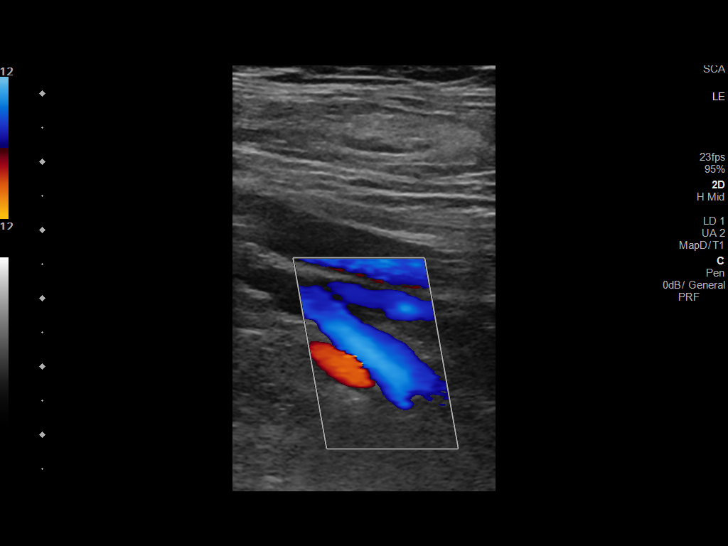
[im 19/43]
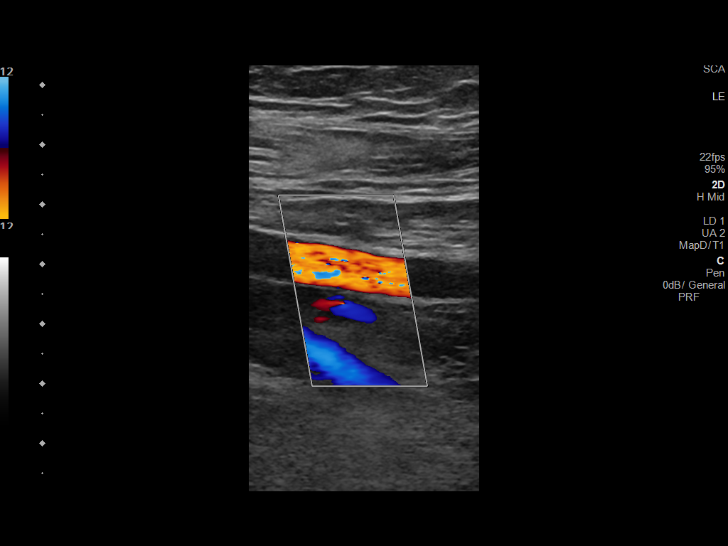
[im 22/43]
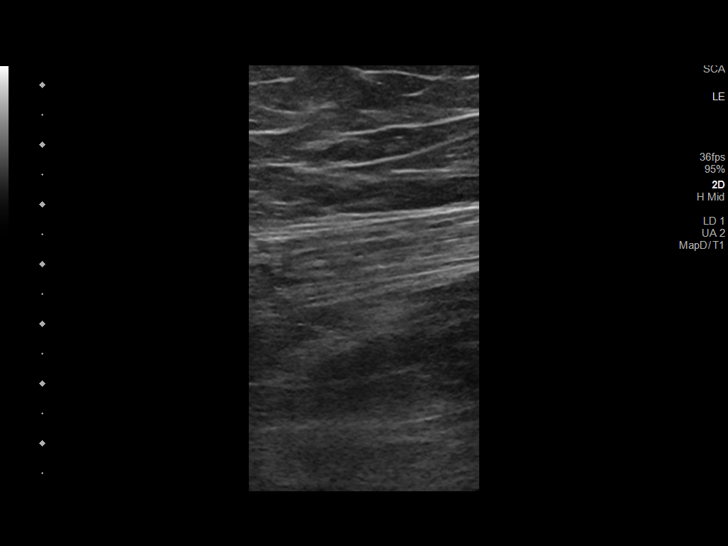
[im 24/43]
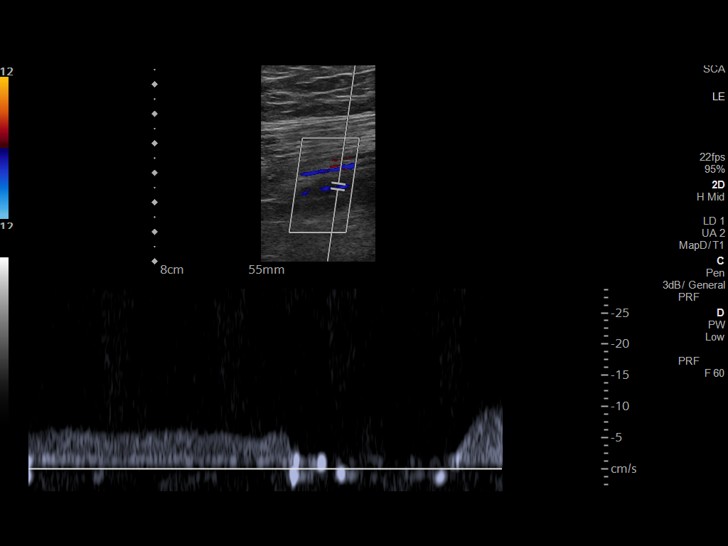
[im 28/43]
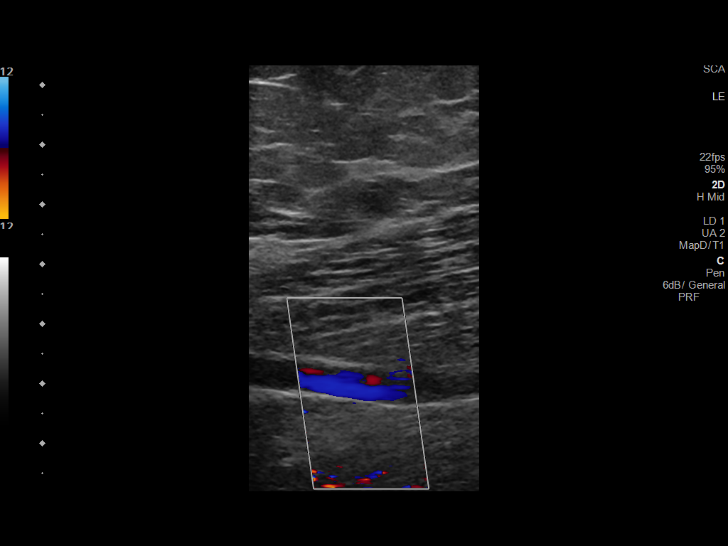
[im 32/43]
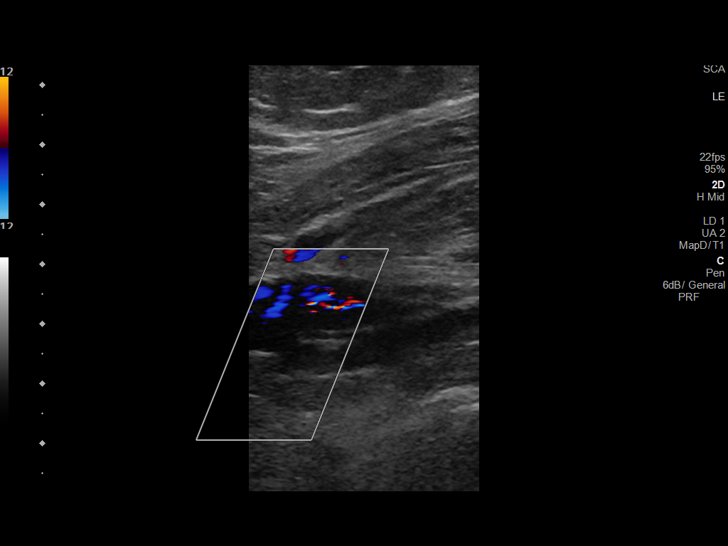
[im 35/43]
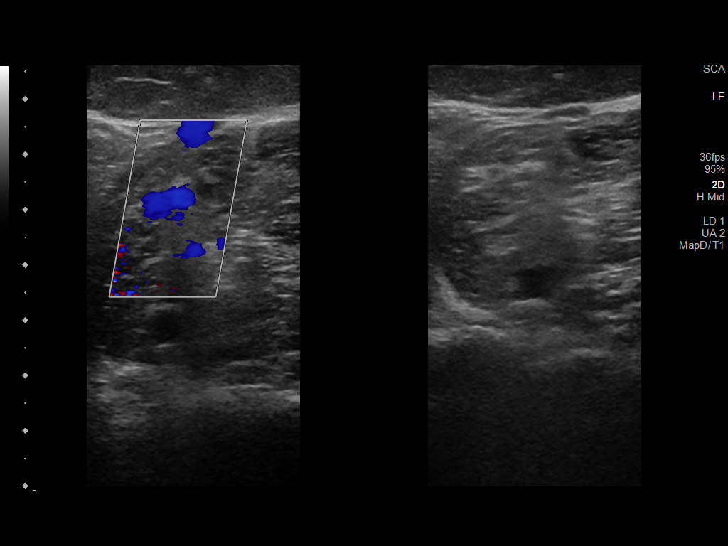
[im 39/43]
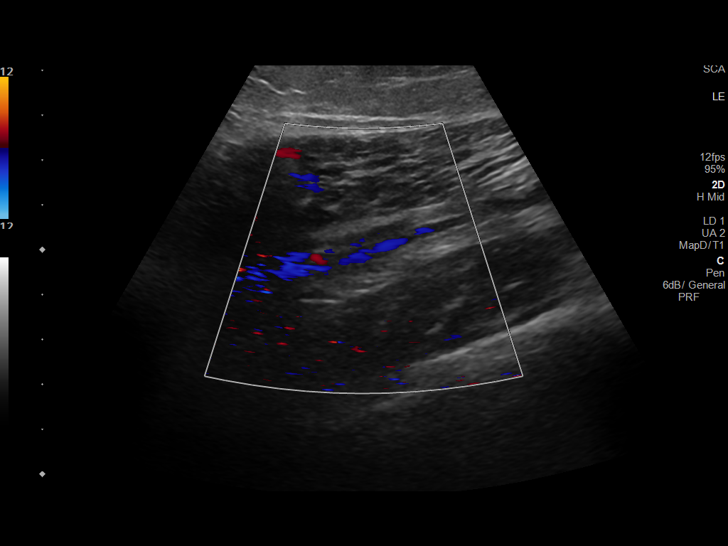
[im 43/43]
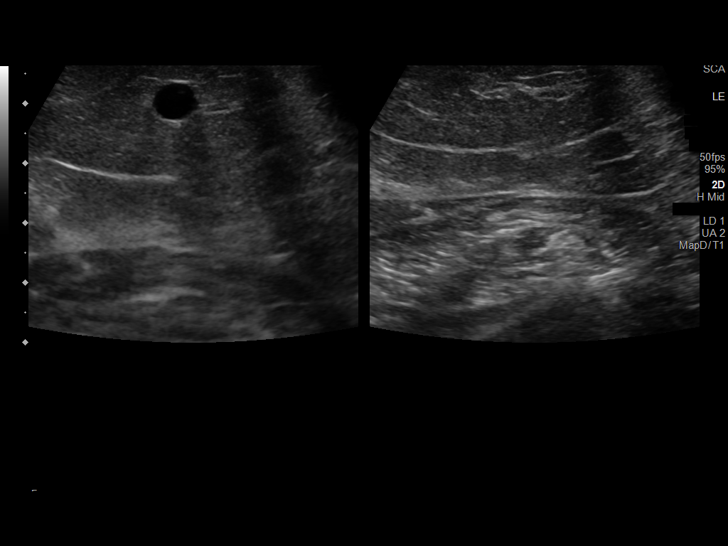

[13 of 24 positions shown; findings below may reference images not displayed]

FINDINGS: Contralateral Common Femoral Vein: Respiratory phasicity is normal
and symmetric with the symptomatic side. No evidence of thrombus.
Normal compressibility.

Common Femoral Vein: No evidence of acute or chronic thrombus.
Normal compressibility, respiratory phasicity and response to
augmentation.

Saphenofemoral Junction: No evidence of thrombus. Normal
compressibility and flow on color Doppler imaging.

Profunda Femoral Vein: No evidence of thrombus. Normal
compressibility and flow on color Doppler imaging.

Femoral Vein: There is near occlusive wall thickening/chronic DVT
involving the proximal (image 20), mid (image 24) and distal (image
28) aspects of the right femoral vein which interval atresia of the
vessel.

Popliteal Vein: There is near occlusive wall thickening/chronic DVT
involving the right popliteal vein (image 33) with interval atresia
of the vessel.

Calf Veins: No evidence of thrombus. Normal compressibility and flow
on color Doppler imaging.

Superficial Great Saphenous Vein: No evidence of thrombus. Normal
compressibility.

Other Findings:  None.
IMPRESSION: 1. No evidence of acute DVT within the right lower extremity.
2. Examination is positive for near occlusive wall
thickening/chronic DVT involving the right femoral and popliteal
veins with interval atresia of both of these vessels.

## 2022-06-09 ENCOUNTER — Encounter: Payer: Self-pay | Admitting: Family

## 2022-06-09 ENCOUNTER — Inpatient Hospital Stay: Payer: 59 | Attending: Hematology & Oncology

## 2022-06-09 ENCOUNTER — Inpatient Hospital Stay: Payer: 59 | Admitting: Family

## 2022-06-09 VITALS — BP 131/81 | HR 85 | Temp 98.9°F | Resp 17 | Wt 307.0 lb

## 2022-06-09 DIAGNOSIS — M549 Dorsalgia, unspecified: Secondary | ICD-10-CM | POA: Diagnosis not present

## 2022-06-09 DIAGNOSIS — O2230 Deep phlebothrombosis in pregnancy, unspecified trimester: Secondary | ICD-10-CM | POA: Diagnosis present

## 2022-06-09 DIAGNOSIS — D6859 Other primary thrombophilia: Secondary | ICD-10-CM

## 2022-06-09 DIAGNOSIS — D6852 Prothrombin gene mutation: Secondary | ICD-10-CM

## 2022-06-09 DIAGNOSIS — Z3A00 Weeks of gestation of pregnancy not specified: Secondary | ICD-10-CM | POA: Diagnosis not present

## 2022-06-09 DIAGNOSIS — I82411 Acute embolism and thrombosis of right femoral vein: Secondary | ICD-10-CM | POA: Diagnosis not present

## 2022-06-09 DIAGNOSIS — I82401 Acute embolism and thrombosis of unspecified deep veins of right lower extremity: Secondary | ICD-10-CM

## 2022-06-09 DIAGNOSIS — R760 Raised antibody titer: Secondary | ICD-10-CM

## 2022-06-09 DIAGNOSIS — D6862 Lupus anticoagulant syndrome: Secondary | ICD-10-CM | POA: Insufficient documentation

## 2022-06-09 DIAGNOSIS — Z79899 Other long term (current) drug therapy: Secondary | ICD-10-CM | POA: Diagnosis not present

## 2022-06-09 DIAGNOSIS — R609 Edema, unspecified: Secondary | ICD-10-CM | POA: Diagnosis not present

## 2022-06-09 DIAGNOSIS — Z7901 Long term (current) use of anticoagulants: Secondary | ICD-10-CM | POA: Insufficient documentation

## 2022-06-09 DIAGNOSIS — D509 Iron deficiency anemia, unspecified: Secondary | ICD-10-CM | POA: Diagnosis not present

## 2022-06-09 DIAGNOSIS — Z3491 Encounter for supervision of normal pregnancy, unspecified, first trimester: Secondary | ICD-10-CM

## 2022-06-09 LAB — CBC WITH DIFFERENTIAL (CANCER CENTER ONLY)
Abs Immature Granulocytes: 0.07 10*3/uL (ref 0.00–0.07)
Basophils Absolute: 0.1 10*3/uL (ref 0.0–0.1)
Basophils Relative: 1 %
Eosinophils Absolute: 0 10*3/uL (ref 0.0–0.5)
Eosinophils Relative: 0 %
HCT: 43.3 % (ref 36.0–46.0)
Hemoglobin: 13.8 g/dL (ref 12.0–15.0)
Immature Granulocytes: 1 %
Lymphocytes Relative: 22 %
Lymphs Abs: 2.4 10*3/uL (ref 0.7–4.0)
MCH: 25.3 pg — ABNORMAL LOW (ref 26.0–34.0)
MCHC: 31.9 g/dL (ref 30.0–36.0)
MCV: 79.3 fL — ABNORMAL LOW (ref 80.0–100.0)
Monocytes Absolute: 0.5 10*3/uL (ref 0.1–1.0)
Monocytes Relative: 4 %
Neutro Abs: 8 10*3/uL — ABNORMAL HIGH (ref 1.7–7.7)
Neutrophils Relative %: 72 %
Platelet Count: 229 10*3/uL (ref 150–400)
RBC: 5.46 MIL/uL — ABNORMAL HIGH (ref 3.87–5.11)
RDW: 15.2 % (ref 11.5–15.5)
WBC Count: 11.1 10*3/uL — ABNORMAL HIGH (ref 4.0–10.5)
nRBC: 0 % (ref 0.0–0.2)

## 2022-06-09 LAB — CMP (CANCER CENTER ONLY)
ALT: 13 U/L (ref 0–44)
AST: 12 U/L — ABNORMAL LOW (ref 15–41)
Albumin: 4.4 g/dL (ref 3.5–5.0)
Alkaline Phosphatase: 74 U/L (ref 38–126)
Anion gap: 10 (ref 5–15)
BUN: 17 mg/dL (ref 6–20)
CO2: 25 mmol/L (ref 22–32)
Calcium: 9.4 mg/dL (ref 8.9–10.3)
Chloride: 101 mmol/L (ref 98–111)
Creatinine: 1.04 mg/dL — ABNORMAL HIGH (ref 0.44–1.00)
GFR, Estimated: >60 mL/min (ref >60)
Glucose, Bld: 97 mg/dL (ref 70–99)
Potassium: 3.7 mmol/L (ref 3.5–5.1)
Sodium: 136 mmol/L (ref 135–145)
Total Bilirubin: 0.4 mg/dL (ref 0.3–1.2)
Total Protein: 7.4 g/dL (ref 6.5–8.1)

## 2022-06-09 LAB — IRON AND IRON BINDING CAPACITY (CC-WL,HP ONLY)
Iron: 49 ug/dL (ref 28–170)
Saturation Ratios: 13 % (ref 10.4–31.8)
TIBC: 385 ug/dL (ref 250–450)
UIBC: 336 ug/dL (ref 148–442)

## 2022-06-09 LAB — FERRITIN: Ferritin: 126 ng/mL (ref 11–307)

## 2022-06-09 MED ORDER — ENOXAPARIN SODIUM 150 MG/ML IJ SOSY: 140.0000 mg | PREFILLED_SYRINGE | Freq: Two times a day (BID) | INTRAMUSCULAR | 3 refills | Status: DC

## 2022-06-09 NOTE — Progress Notes (Signed)
Hematology and Oncology Follow Up Visit  Nancy Rivera [id number] 11-16-1985 37 y.o. 06/09/2022   Principle Diagnosis:  Acute DVT of the femoral vein of the right lower extremity Lupus anticoagulant positive Prothrombin gene mutation - heterozygous    Past Therapy: Xarelto - progression Coumadin - INR not therapeutic  Eliquis - recurrent DVT in RLE   Current Therapy:        Lovenox 140 mg SQ BID - restarted 06/09/2022   Interim History:  Nancy Rivera is here today for follow-up and to let us know she is pregnant. She tested positive this week on Wednesday and will be seeing her maternal fetal specialist next week on Wednesday.  She was in the hospital ([facility]) back in November 2023 with a left retroperitoneal bleed. She had been having back pain and then had what sounds like a syncopal episode while straining with constipation prior to diagnosis.  She was then transitioned to Coumadin which she stopped on Wednesday with positive pregnancy test. Prior to stopping she was taking 10 mg on Tues, Thurs and Sat and 7.5 mg on the other days of the week. INR was checked today and was 2.11 per patient.  She has not had any other issues with bleeding. No abnormal bruising, no petechiae.  No fever, chills, n/v, cough, rash, dizziness, SOB, chest pain, palpitations, abdominal pain or changes in bowel or bladder habits.  Swelling in her lower extremities waxes and wanes. She wears compression stockings to help reduce fluid retention.  No new syncopal episodes, no falls.  Appetite and hydration are good. Weight is stable 307 lbs.   ECOG Performance Status: 1 - Symptomatic but completely ambulatory  Medications:  Allergies as of 06/09/2022   No Known Allergies      Medication List        Accurate as of June 09, 2022  1:16 PM. If you have any questions, ask your nurse or doctor.          acetaminophen 500 MG tablet Commonly known as: TYLENOL   enoxaparin 150 MG/ML injection Commonly  known as: LOVENOX INJECT 0.93ML (140MG )  SUBCUTANEOUSLY EVERY 12 HOURS  (DISCARD UNUSED AFTER FIRST USE)   labetalol 200 MG tablet Commonly known as: NORMODYNE Take 1 tablet (200 mg total) by mouth 3 (three) times daily. What changed: when to take this   metFORMIN 500 MG tablet Commonly known as: GLUCOPHAGE Take 500 mg by mouth daily.   phentermine 37.5 MG capsule Take 37.5 mg by mouth every morning.   sertraline 50 MG tablet Commonly known as: ZOLOFT Take 50 mg by mouth at bedtime.   topiramate 25 MG tablet Commonly known as: TOPAMAX Take 25 mg by mouth at bedtime.   triamcinolone ointment 0.1 % Commonly known as: KENALOG Apply topically as directed.        Allergies: No Known Allergies  Past Medical History, Surgical history, Social history, and Family History were reviewed and updated.  Review of Systems: All other 10 point review of systems is negative.   Physical Exam:  weight is 307 lb (139.3 kg) (abnormal). Her oral temperature is 98.9 F (37.2 C). Her blood pressure is 151/91 (abnormal) and her pulse is 85. Her respiration is 17 and oxygen saturation is 100%.   Wt Readings from Last 3 Encounters:  06/09/22 (!) 307 lb (139.3 kg)  02/10/22 (!) 315 lb 12.8 oz (143.2 kg)  11/04/21 (!) 335 lb 12.8 oz (152.3 kg)    Ocular: Sclerae unicteric, pupils equal, round and  reactive to light Ear-nose-throat: Oropharynx clear, dentition fair Lymphatic: No cervical or supraclavicular adenopathy Lungs no rales or rhonchi, good excursion bilaterally Heart regular rate and rhythm, no murmur appreciated Abd soft, nontender, positive bowel sounds MSK no focal spinal tenderness, no joint edema Neuro: non-focal, well-oriented, appropriate affect Breasts: Deferred   Lab Results  Component Value Date   WBC 11.1 (H) 06/09/2022   HGB 13.8 06/09/2022   HCT 43.3 06/09/2022   MCV 79.3 (L) 06/09/2022   PLT 229 06/09/2022   Lab Results  Component Value Date   FERRITIN 23  02/10/2022   IRON 42 02/10/2022   TIBC 476 (H) 02/10/2022   UIBC 434 02/10/2022   IRONPCTSAT 9 (L) 02/10/2022   Lab Results  Component Value Date   RBC 5.46 (H) 06/09/2022   No results found for: "KPAFRELGTCHN", "LAMBDASER", "KAPLAMBRATIO" No results found for: "IGGSERUM", "IGA", "IGMSERUM" No results found for: "TOTALPROTELP", "ALBUMINELP", "A1GS", "A2GS", "BETS", "BETA2SER", "GAMS", "MSPIKE", "SPEI"   Chemistry      Component Value Date/Time   NA 138 02/10/2022 1411   K 4.0 02/10/2022 1411   CL 103 02/10/2022 1411   CO2 26 02/10/2022 1411   BUN 18 02/10/2022 1411   CREATININE 1.49 (H) 02/10/2022 1411      Component Value Date/Time   CALCIUM 9.8 02/10/2022 1411   ALKPHOS 66 02/10/2022 1411   AST 21 02/10/2022 1411   ALT 30 02/10/2022 1411   BILITOT 0.3 02/10/2022 1411       Impression and Plan: Nancy Rivera is a very pleasant 37 yo caucasian female with chronic almost occlusive right lower extremity DVT.  She has stopped her Coumadin and will now resume her regimen with Lovenox 140 mg SQ BID per MD.  Iron studies pending.  Follow-up in 5 weeks.   Nancy Dawson, NP 3/15/20241:16 PM

## 2022-06-10 LAB — LUPUS ANTICOAGULANT PANEL
DRVVT: 56.1 s — ABNORMAL HIGH (ref 0.0–47.0)
PTT Lupus Anticoagulant: 40.9 s (ref 0.0–43.5)

## 2022-06-10 LAB — DRVVT CONFIRM: dRVVT Confirm: 1.1 ratio (ref 0.8–1.2)

## 2022-06-10 LAB — DRVVT MIX: dRVVT Mix: 43.3 s — ABNORMAL HIGH (ref 0.0–40.4)

## 2022-06-13 DIAGNOSIS — O09529 Supervision of elderly multigravida, unspecified trimester: Secondary | ICD-10-CM | POA: Insufficient documentation

## 2022-06-13 DIAGNOSIS — E282 Polycystic ovarian syndrome: Secondary | ICD-10-CM

## 2022-06-13 DIAGNOSIS — E669 Obesity, unspecified: Secondary | ICD-10-CM | POA: Insufficient documentation

## 2022-06-13 HISTORY — DX: Polycystic ovarian syndrome: E28.2

## 2022-06-13 NOTE — Progress Notes (Addendum)
Patient information  Patient Name: Nancy Rivera  Patient MRN:   [id number]  Referring practice: [facility] Referring Provider: [facility]  Medical/Obstetric History   Past pregnancies OB History  Gravida Para Term Preterm AB Living  3 2 1 1   2   SAB IAB Ectopic Multiple Live Births        0 2    # Outcome Date GA Lbr Len/2nd Weight Sex Delivery Anes PTL Lv  3 Current           2 Preterm 07/05/20 [redacted]w[redacted]d  6 lb 2.9 oz (2.805 kg) M CS-Vac Spinal  LIV  1 Term 07/12/13 [redacted]w[redacted]d 04:15 / 00:37 6 lb 0.3 oz (2.73 kg) F Vag-Spont EPI  LIV     Nancy Rivera is a 37 y.o. [id number] at [redacted]w[redacted]d here for ultrasound and consultation for  chronic almost occlusive right lower extremity DVT requiring anticoagulation.   Nancy Rivera has a history of a DVT that occurred 2 months after the birth of her son in 2022.  She was taking estrogen-based birth control at the time. She was initially diagnosed on 08/31/2020 and treated with Heparin IV before transitioning to Lovenox and going home on Xarelto. US showed acute DVT involving the right femoral, right popliteal, right posterior tibial, right soleal and right gastrocnemius veins. US performed with her PCP showed DVT only effecting the popliteal vein. She had gone to the ED on 10/01/2020 with worsening pain and swelling. [place] repeated at that time showed a nearly occlusive thrombus effecting the right popliteal and right femoral veins. The right common femoral vein thrombus had resolved. At the time the vascular specialist felt appropriate to discontinue Xarelto and bridge with Lovenox prior to starting Coumadin. She has no prior history of thrombotic event and no known family history of thrombotic event. In September 2022 she had a very high INR on Coumadin and was changed to Eliquis. Thrombophilia testing showed she was lupus anticoagulant positive and was heterozygous for the prothrombin gene mutation. In January of 2023 she went to urgent care with leg pain/swelling and was  diagnosed with a new DVT despite being on Eliquis.  She was then transition to Lovenox 140 mg SQ twice daily. In April for 2023 she had  repeat US of the right leg that showed no evidence of DVT but she did have persistent synechiae from the previous DVT within the superficial femoral vein proximally and the popliteal vein. Unfortunately while on Lovenox she devolved a left retroperitoneal bleed and was life-flown to [facility] back in November 2023. Prior to the event she had been having back pain and then had what sounds like a syncopal episode while straining with constipation prior to diagnosis.  At this time she was then transitioned to Coumadin which she stopped on Wednesday with positive pregnancy test. Prior to stopping she was taking 10 mg on Tues, Thurs and Sat and 7.5 mg on the other days of the week. INR was checked today and was 2.11 per patient.   RE Antiphospholipid Syndrome, Prothrombin mutation and hx of DVT The patient meets criteria for antiphospholipid syndrome based on her history of chronic DVT (clinical criteria) and positive lupus anticoagulant over 12 weeks apart.  (lab criteria).   I discussed that APS is an acquired autoimmune disorder associated with increased risk of thrombosis as well as adverse pregnancy outcomes.  Patients with APS are more likely to have recurrent miscarriage especially over [redacted] weeks gestation, stillbirth, placental insufficiency, growth restriction and increased  rates of early onset severe preeclampsia.  There is also an increased risk of thrombocytopenia similar to ITP.  Management of APS typically consist of anticoagulation and aspirin therapy throughout the pregnancy continuing until least 6 weeks postpartum or longer if indicated.  I discussed the role of future ultrasounds to assess fetal anatomy and growth as well as antenatal testing to minimize the risk of stillbirth.  We discussed the importance of assessing anti-Xa levels 3 to 4 hours after the dose of  Lovenox to ensure the level is therapeutic between 0.6 and 1.  Anesthesia consultation is recommended in the third trimester or at the time of admission.  Lovenox typically is held 24 hours prior to the onset of labor to minimize the risk of epidural or spinal hematoma if neuraxial anesthesia is required or desired.  I discussed that none of these medical conditions are absolute contraindications but they do have the potential for developing a life-threatening complication such as uncontrolled bleeding or DVT/PE.   RE Warfarin exposure in pregnancy  Nancy Rivera was on warfarin at the time of conception until about 5 weeks pregnancy when she discontinued.  I discussed the potential for severe birth defects given the warfarin exposure but thankfully this is probably less than 1 to 5% since she discontinued early in the pregnancy before 6 weeks.  I discussed the role of termination and emphasized that this would be an option until the 12 th week of pregnancy in the state of [place] in the absence of life threatening maternal or life-limiting fetal abnormalities. Delivery can occur at any time if there is reasonable concern for life of the mother.  In the case of a "life-limiting" fetal abnormality, abortion is legal through the 24th week of pregnancy. I also discussed that there are other nuances around the state abortion legislature and that we could refer her out-of-state if there were certain restrictions later in the pregnancy.  Warfarin exposure early in pregnancy is associated with multiple birth defect  mostly localized within the skeletal and CNS. These include: nasal hypoplasia, stippled epiphyses visible on radiographs, intellectual disability, microcephaly, hydrocephalus, Dandy-Walker malformation, agenesis of the corpus callosum, eye defects such as optic atrophy or cataracts. Fetal growth restriction is also common. The critical period for the nasal and bony effects of fetal warfarin syndrome appears  to be between 6 and 9 weeks' gestation. Overall there is a 3.9-fold increase in the overall rate of major congenital anomalies in comparison to healthy unexposed women, however, only about 0.5% of first trimester warfarin exposure presents with typical fetal warfarin syndrome. The actual risk of warfarin embryopathy is difficult to estimate, but is probably in the range of 5%. There is good evidence that embryopathy is less likely if the warfarin dose is less than 5 mg/d. The fetal risks continue beyond the first trimester, because warfarin increases the possibility of both fetal and intrauterine bleeding. Other pregnancy complications that are increased with warfarin exposure include fetal growth restriction, spontaneous abortion and low IQ less than 80 with second trimester and third trimester exposure.  Pregnancy specific complications with warfarin exposure include spontaneous abortion, preterm delivery and fetal growth restriction.  Therefore, a detailed anatomy ultrasound to assess for fetal birth defects as well as placental bleeding in addition to serial growth ultrasounds in the third trimester are recommended.  Antenatal testing is reserved for growth restriction  or the presence of birth defects.  Review of Systems: A review of systems was performed and was negative except per HPI  Vitals and Physical Exam    06/09/2022    1:17 PM 06/09/2022    1:13 PM 02/10/2022    2:39 PM  Vitals with BMI  Height   5\' 9"   Weight  307 lbs 315 lbs 13 oz  BMI   99991111  Systolic [id number]  Diastolic 81 91 79  Pulse  85 84  Sitting comfortably  Nonlabored breathing Normal rate and rhythm  Assessment Patient Active Problem List   Diagnosis Date Noted   Antiphospholipid syndrome complicating pregnancy, antepartum ([place]) 06/16/2022   Multigravida of advanced maternal age 77/19/2024   Obesity 06/13/2022   Current use of long term anticoagulation 03/03/2022   Deep venous thrombosis, chronic ([place])  09/02/2020   Vaginal delivery 07/05/2020   Chronic hypertension affecting pregnancy 07/04/2020    Plan -I discussed the role of termination and she has declined at this time but would consider termination in the setting of a major birth defect -Ultrasound around 12 to 13 weeks to rule out any major birth defects that may be related to warfarin exposure that may lead her to terminate the pregnancy if present -Detailed anatomy ultrasound with special attention to the fetal CNS and skeletal system  -Baseline preeclampsia labs: CMP, CBC and urine protein/creatinine ratio if not previously completed.  -Early glucose screening due to multiple risk factors. -Aspirin 81 mg for preeclampsia prophylaxis and due to APS -Continue weight-based Lovenox at 1 mg/kg twice a day.  I discussed the importance of checking anti-Xa levels 3-4 hours after her Lovenox dose. The goal is to have the level between 0.6 and 1. This is critical since she already had a large bleeding event while on Lovenox.  If she has complications on Lovenox we can consider transitioning to warfarin between 12 and [redacted] weeks gestation. -Serial growth ultrasounds starting around 28 weeks to monitor for fetal growth restriction -Fetal echo due to Warfarin exposure early in pregnancy  -Antenatal testing to start around 30-32 weeks due to the increased risk of stillbirth given her high risk complications -Delivery timing pending clinical course but likely around 37-[redacted] weeks gestation pending her clinical course -Anti-coagulation is critical postpartum, since this will be the highest risk time.  With the guidance of her hematologist she can consider continuing Lovenox versus transitioning to a different agent with breast-feeding considerations. -Continue routine prenatal care with referring OB provider and hematologist  I spent 60 minutes reviewing the patients chart, including labs and images as well as counseling the patient about her medical  conditions. Greater than 50% of the time was spent in direct face-to-face patient counseling.  Valeda Malm  MFM, [place]   06/13/2022  12:41 PM   Future Appointments   Future Appointments  Date Time Provider [place]  06/14/2022  8:45 AM [facility] NURSE [facility]  06/14/2022  9:00 AM [facility] MD [facility]  06/27/2022  7:45 AM [facility] None  06/27/2022  8:00 AM Ennever, Rudell Cobb, MD [facility] None  08/01/2022  1:15 PM [facility] LAB [facility] None  08/01/2022  1:30 PM Celso Amy, NP [facility] None

## 2022-06-14 ENCOUNTER — Other Ambulatory Visit: Payer: Self-pay | Admitting: *Deleted

## 2022-06-14 ENCOUNTER — Ambulatory Visit: Payer: 59 | Admitting: *Deleted

## 2022-06-14 ENCOUNTER — Encounter: Payer: Self-pay | Admitting: *Deleted

## 2022-06-14 ENCOUNTER — Ambulatory Visit: Payer: 59 | Attending: Obstetrics & Gynecology | Admitting: Maternal & Fetal Medicine

## 2022-06-14 VITALS — BP 144/77 | HR 76

## 2022-06-14 DIAGNOSIS — O09521 Supervision of elderly multigravida, first trimester: Secondary | ICD-10-CM

## 2022-06-14 DIAGNOSIS — Z86718 Personal history of other venous thrombosis and embolism: Secondary | ICD-10-CM | POA: Diagnosis not present

## 2022-06-14 DIAGNOSIS — O99119 Other diseases of the blood and blood-forming organs and certain disorders involving the immune mechanism complicating pregnancy, unspecified trimester: Secondary | ICD-10-CM

## 2022-06-14 DIAGNOSIS — O10911 Unspecified pre-existing hypertension complicating pregnancy, first trimester: Secondary | ICD-10-CM | POA: Insufficient documentation

## 2022-06-14 DIAGNOSIS — Z3A01 Less than 8 weeks gestation of pregnancy: Secondary | ICD-10-CM

## 2022-06-14 DIAGNOSIS — D6862 Lupus anticoagulant syndrome: Secondary | ICD-10-CM | POA: Diagnosis not present

## 2022-06-14 DIAGNOSIS — O99111 Other diseases of the blood and blood-forming organs and certain disorders involving the immune mechanism complicating pregnancy, first trimester: Secondary | ICD-10-CM | POA: Insufficient documentation

## 2022-06-14 DIAGNOSIS — Z7901 Long term (current) use of anticoagulants: Secondary | ICD-10-CM | POA: Diagnosis not present

## 2022-06-14 DIAGNOSIS — O10919 Unspecified pre-existing hypertension complicating pregnancy, unspecified trimester: Secondary | ICD-10-CM | POA: Diagnosis not present

## 2022-06-14 DIAGNOSIS — Z8759 Personal history of other complications of pregnancy, childbirth and the puerperium: Secondary | ICD-10-CM | POA: Insufficient documentation

## 2022-06-14 DIAGNOSIS — O09891 Supervision of other high risk pregnancies, first trimester: Secondary | ICD-10-CM

## 2022-06-14 DIAGNOSIS — D6861 Antiphospholipid syndrome: Secondary | ICD-10-CM

## 2022-06-15 ENCOUNTER — Encounter: Payer: Self-pay | Admitting: Family

## 2022-06-16 ENCOUNTER — Inpatient Hospital Stay: Payer: 59

## 2022-06-16 ENCOUNTER — Ambulatory Visit: Payer: 59 | Admitting: Family

## 2022-06-16 DIAGNOSIS — D6861 Antiphospholipid syndrome: Secondary | ICD-10-CM | POA: Insufficient documentation

## 2022-06-19 ENCOUNTER — Encounter: Payer: Self-pay | Admitting: Family

## 2022-06-19 ENCOUNTER — Other Ambulatory Visit: Payer: Self-pay | Admitting: Family

## 2022-06-19 ENCOUNTER — Telehealth: Payer: Self-pay | Admitting: Family

## 2022-06-19 NOTE — Telephone Encounter (Signed)
The bruise on her right calf appears to be healing nicely. Her maternal fetal medicine specialist requested she have an Anti-Factor Xa level checked. We will have her come in between 10-10:30 this week at her convenience for this. Patient in agreement with the plan. No other questions or concerns at this time. Patient appreciative of call.

## 2022-06-21 ENCOUNTER — Other Ambulatory Visit: Payer: Self-pay | Admitting: Family

## 2022-06-21 DIAGNOSIS — R760 Raised antibody titer: Secondary | ICD-10-CM

## 2022-06-21 DIAGNOSIS — I82401 Acute embolism and thrombosis of unspecified deep veins of right lower extremity: Secondary | ICD-10-CM

## 2022-06-21 DIAGNOSIS — Z5181 Encounter for therapeutic drug level monitoring: Secondary | ICD-10-CM

## 2022-06-21 DIAGNOSIS — D6859 Other primary thrombophilia: Secondary | ICD-10-CM

## 2022-06-21 DIAGNOSIS — D6852 Prothrombin gene mutation: Secondary | ICD-10-CM

## 2022-06-21 DIAGNOSIS — Z3491 Encounter for supervision of normal pregnancy, unspecified, first trimester: Secondary | ICD-10-CM

## 2022-06-21 DIAGNOSIS — I82411 Acute embolism and thrombosis of right femoral vein: Secondary | ICD-10-CM

## 2022-06-23 ENCOUNTER — Telehealth: Payer: Self-pay

## 2022-06-23 ENCOUNTER — Inpatient Hospital Stay: Payer: 59

## 2022-06-23 DIAGNOSIS — I82401 Acute embolism and thrombosis of unspecified deep veins of right lower extremity: Secondary | ICD-10-CM

## 2022-06-23 DIAGNOSIS — Z5181 Encounter for therapeutic drug level monitoring: Secondary | ICD-10-CM

## 2022-06-23 DIAGNOSIS — Z3491 Encounter for supervision of normal pregnancy, unspecified, first trimester: Secondary | ICD-10-CM

## 2022-06-23 DIAGNOSIS — D6852 Prothrombin gene mutation: Secondary | ICD-10-CM

## 2022-06-23 DIAGNOSIS — I82411 Acute embolism and thrombosis of right femoral vein: Secondary | ICD-10-CM

## 2022-06-23 DIAGNOSIS — D6859 Other primary thrombophilia: Secondary | ICD-10-CM

## 2022-06-23 DIAGNOSIS — R760 Raised antibody titer: Secondary | ICD-10-CM

## 2022-06-23 DIAGNOSIS — D509 Iron deficiency anemia, unspecified: Secondary | ICD-10-CM

## 2022-06-23 DIAGNOSIS — O2230 Deep phlebothrombosis in pregnancy, unspecified trimester: Secondary | ICD-10-CM | POA: Diagnosis not present

## 2022-06-23 LAB — IRON AND IRON BINDING CAPACITY (CC-WL,HP ONLY)
Iron: 71 ug/dL (ref 28–170)
Saturation Ratios: 19 % (ref 10.4–31.8)
TIBC: 365 ug/dL (ref 250–450)
UIBC: 294 ug/dL (ref 148–442)

## 2022-06-23 LAB — CBC WITH DIFFERENTIAL (CANCER CENTER ONLY)
Abs Immature Granulocytes: 0.01 10*3/uL (ref 0.00–0.07)
Basophils Absolute: 0.1 10*3/uL (ref 0.0–0.1)
Basophils Relative: 1 %
Eosinophils Absolute: 0.1 10*3/uL (ref 0.0–0.5)
Eosinophils Relative: 2 %
HCT: 41.9 % (ref 36.0–46.0)
Hemoglobin: 13.5 g/dL (ref 12.0–15.0)
Immature Granulocytes: 0 %
Lymphocytes Relative: 25 %
Lymphs Abs: 1.5 10*3/uL (ref 0.7–4.0)
MCH: 26.2 pg (ref 26.0–34.0)
MCHC: 32.2 g/dL (ref 30.0–36.0)
MCV: 81.2 fL (ref 80.0–100.0)
Monocytes Absolute: 0.3 10*3/uL (ref 0.1–1.0)
Monocytes Relative: 5 %
Neutro Abs: 4.2 10*3/uL (ref 1.7–7.7)
Neutrophils Relative %: 67 %
Platelet Count: 171 10*3/uL (ref 150–400)
RBC: 5.16 MIL/uL — ABNORMAL HIGH (ref 3.87–5.11)
RDW: 15.7 % — ABNORMAL HIGH (ref 11.5–15.5)
WBC Count: 6.2 10*3/uL (ref 4.0–10.5)
nRBC: 0 % (ref 0.0–0.2)

## 2022-06-23 LAB — CMP (CANCER CENTER ONLY)
ALT: 31 U/L (ref 0–44)
AST: 18 U/L (ref 15–41)
Albumin: 4.3 g/dL (ref 3.5–5.0)
Alkaline Phosphatase: 63 U/L (ref 38–126)
Anion gap: 9 (ref 5–15)
BUN: 13 mg/dL (ref 6–20)
CO2: 25 mmol/L (ref 22–32)
Calcium: 9.2 mg/dL (ref 8.9–10.3)
Chloride: 103 mmol/L (ref 98–111)
Creatinine: 1.01 mg/dL — ABNORMAL HIGH (ref 0.44–1.00)
GFR, Estimated: >60 mL/min (ref >60)
Glucose, Bld: 95 mg/dL (ref 70–99)
Potassium: 4.2 mmol/L (ref 3.5–5.1)
Sodium: 137 mmol/L (ref 135–145)
Total Bilirubin: 0.5 mg/dL (ref 0.3–1.2)
Total Protein: 6.6 g/dL (ref 6.5–8.1)

## 2022-06-23 LAB — HEPARIN ANTI-XA: Heparin LMW: 1.5 IU/mL

## 2022-06-23 LAB — FERRITIN: Ferritin: 119 ng/mL (ref 11–307)

## 2022-06-23 NOTE — Telephone Encounter (Signed)
Patient came in for lab only today and stated her OB wanted to also put her on a baby ASA along with her lovenox we prescribe, okay per Dr.Ennever as long as it is a low dose ASA. Pt informed via mychart

## 2022-06-24 LAB — PTT-LA MIX: PTT-LA Mix: 55.3 s — ABNORMAL HIGH (ref 0.0–40.5)

## 2022-06-24 LAB — DRVVT CONFIRM: dRVVT Confirm: 1.2 ratio (ref 0.8–1.2)

## 2022-06-24 LAB — LUPUS ANTICOAGULANT PANEL
DRVVT: 51.8 s — ABNORMAL HIGH (ref 0.0–47.0)
PTT Lupus Anticoagulant: 59 s — ABNORMAL HIGH (ref 0.0–43.5)

## 2022-06-24 LAB — HEXAGONAL PHASE PHOSPHOLIPID: Hexagonal Phase Phospholipid: 7 s (ref 0–11)

## 2022-06-24 LAB — DRVVT MIX: dRVVT Mix: 44.3 s — ABNORMAL HIGH (ref 0.0–40.4)

## 2022-06-27 ENCOUNTER — Inpatient Hospital Stay: Payer: 59

## 2022-06-27 ENCOUNTER — Encounter: Payer: Self-pay | Admitting: Hematology & Oncology

## 2022-06-27 ENCOUNTER — Inpatient Hospital Stay: Payer: 59 | Attending: Hematology & Oncology | Admitting: Hematology & Oncology

## 2022-06-27 VITALS — BP 123/70 | HR 80 | Temp 98.5°F | Resp 20 | Ht 69.0 in | Wt 311.0 lb

## 2022-06-27 DIAGNOSIS — O99119 Other diseases of the blood and blood-forming organs and certain disorders involving the immune mechanism complicating pregnancy, unspecified trimester: Secondary | ICD-10-CM

## 2022-06-27 DIAGNOSIS — I82411 Acute embolism and thrombosis of right femoral vein: Secondary | ICD-10-CM | POA: Diagnosis present

## 2022-06-27 DIAGNOSIS — Z3A08 8 weeks gestation of pregnancy: Secondary | ICD-10-CM

## 2022-06-27 DIAGNOSIS — Z7901 Long term (current) use of anticoagulants: Secondary | ICD-10-CM | POA: Insufficient documentation

## 2022-06-27 DIAGNOSIS — Z3A00 Weeks of gestation of pregnancy not specified: Secondary | ICD-10-CM | POA: Insufficient documentation

## 2022-06-27 DIAGNOSIS — M7989 Other specified soft tissue disorders: Secondary | ICD-10-CM | POA: Diagnosis not present

## 2022-06-27 DIAGNOSIS — O2230 Deep phlebothrombosis in pregnancy, unspecified trimester: Secondary | ICD-10-CM | POA: Diagnosis present

## 2022-06-27 DIAGNOSIS — D6862 Lupus anticoagulant syndrome: Secondary | ICD-10-CM | POA: Diagnosis not present

## 2022-06-27 DIAGNOSIS — D6861 Antiphospholipid syndrome: Secondary | ICD-10-CM

## 2022-06-27 DIAGNOSIS — Z86718 Personal history of other venous thrombosis and embolism: Secondary | ICD-10-CM | POA: Diagnosis not present

## 2022-06-27 NOTE — Progress Notes (Signed)
Hematology and Oncology Follow Up Visit  CARLETA BOURDO [id number] [birth year] 37 y.o. 06/09/2022   Principle Diagnosis:  Acute DVT of the femoral vein of the right lower extremity Lupus anticoagulant positive Prothrombin gene mutation - heterozygous    Past Therapy: Xarelto - progression Coumadin - INR not therapeutic  Eliquis - recurrent DVT in RLE   Current Therapy:        Lovenox 140 mg SQ BID - restarted 06/09/2022 EC ASA 81mg  po q day   Interim History:  Ms. Nancy Rivera is here today for follow-up.  She is doing well with her pregnancy.  So far, everything is going quite well.  I think she is going to have a confirmatory ultrasound this week.  She is doing well on the Lovenox.  We have her on full dose Lovenox.  She also was started on baby aspirin.  I told to make sure she takes a baby aspirin with food in the morning.  She has had no problems with bleeding.  There is no change in bowel or bladder habits.  She has had no cough or shortness of breath.  There is been no chest wall pain.  She has had little bit of leg swelling but this is more chronic.  She does stand quite a bit at work.  She has had no fever.  She has had COVID in the past.  She has had no morning sickness.  She has had no headache.  Overall, I would have to say that her performance status is ECOG 1.   Medications:  Allergies as of 06/09/2022   No Known Allergies      Medication List        Accurate as of June 09, 2022  1:16 PM. If you have any questions, ask your nurse or doctor.          acetaminophen 500 MG tablet Commonly known as: TYLENOL   enoxaparin 150 MG/ML injection Commonly known as: LOVENOX INJECT 0.93ML (140MG )  SUBCUTANEOUSLY EVERY 12 HOURS  (DISCARD UNUSED AFTER FIRST USE)   labetalol 200 MG tablet Commonly known as: NORMODYNE Take 1 tablet (200 mg total) by mouth 3 (three) times daily. What changed: when to take this   metFORMIN 500 MG tablet Commonly known as:  GLUCOPHAGE Take 500 mg by mouth daily.   phentermine 37.5 MG capsule Take 37.5 mg by mouth every morning.   sertraline 50 MG tablet Commonly known as: ZOLOFT Take 50 mg by mouth at bedtime.   topiramate 25 MG tablet Commonly known as: TOPAMAX Take 25 mg by mouth at bedtime.   triamcinolone ointment 0.1 % Commonly known as: KENALOG Apply topically as directed.        Allergies: No Known Allergies  Past Medical History, Surgical history, Social history, and Family History were reviewed and updated.  Review of Systems: Review of Systems  Constitutional: Negative.   HENT: Negative.    Eyes: Negative.   Respiratory: Negative.    Cardiovascular:  Positive for leg swelling.  Gastrointestinal: Negative.   Genitourinary: Negative.   Musculoskeletal: Negative.   Skin: Negative.   Neurological: Negative.   Endo/Heme/Allergies: Negative.   Psychiatric/Behavioral: Negative.        Physical Exam:  weight is 307 lb (139.3 kg) (abnormal). Her oral temperature is 98.9 F (37.2 C). Her blood pressure is 151/91 (abnormal) and her pulse is 85. Her respiration is 17 and oxygen saturation is 100%.   Wt Readings from Last 3 Encounters:  06/09/22 (!) 307  lb (139.3 kg)  02/10/22 (!) 315 lb 12.8 oz (143.2 kg)  11/04/21 (!) 335 lb 12.8 oz (152.3 kg)   Physical Exam Vitals reviewed.  HENT:     Head: Normocephalic and atraumatic.  Eyes:     Pupils: Pupils are equal, round, and reactive to light.  Cardiovascular:     Rate and Rhythm: Normal rate and regular rhythm.     Heart sounds: Normal heart sounds.  Pulmonary:     Effort: Pulmonary effort is normal.     Breath sounds: Normal breath sounds.  Abdominal:     General: Bowel sounds are normal.     Palpations: Abdomen is soft.     Comments: Abdominal exam is somewhat obese.  She does have the ecchymoses where she does her Lovenox injections.  There is no abdominal mass.  There is no palpable liver or spleen tip.  Musculoskeletal:         General: No tenderness or deformity. Normal range of motion.     Cervical back: Normal range of motion.     Comments: Extremities show some chronic edema.  This is nonpitting edema.  She has good range of motion of her joints.  She has good pulses in the distal extremities.  Lymphadenopathy:     Cervical: No cervical adenopathy.  Skin:    General: Skin is warm and dry.     Findings: No erythema or rash.  Neurological:     Mental Status: She is alert and oriented to person, place, and time.  Psychiatric:        Behavior: Behavior normal.        Thought Content: Thought content normal.        Judgment: Judgment normal.     Lab Results  Component Value Date   WBC 11.1 (H) 06/09/2022   HGB 13.8 06/09/2022   HCT 43.3 06/09/2022   MCV 79.3 (L) 06/09/2022   PLT 229 06/09/2022   Lab Results  Component Value Date   FERRITIN 23 02/10/2022   IRON 42 02/10/2022   TIBC 476 (H) 02/10/2022   UIBC 434 02/10/2022   IRONPCTSAT 9 (L) 02/10/2022   Lab Results  Component Value Date   RBC 5.46 (H) 06/09/2022   No results found for: "KPAFRELGTCHN", "LAMBDASER", "KAPLAMBRATIO" No results found for: "IGGSERUM", "IGA", "IGMSERUM" No results found for: "TOTALPROTELP", "ALBUMINELP", "A1GS", "A2GS", "BETS", "BETA2SER", "GAMS", "MSPIKE", "SPEI"   Chemistry      Component Value Date/Time   NA 138 02/10/2022 1411   K 4.0 02/10/2022 1411   CL 103 02/10/2022 1411   CO2 26 02/10/2022 1411   BUN 18 02/10/2022 1411   CREATININE 1.49 (H) 02/10/2022 1411      Component Value Date/Time   CALCIUM 9.8 02/10/2022 1411   ALKPHOS 66 02/10/2022 1411   AST 21 02/10/2022 1411   ALT 30 02/10/2022 1411   BILITOT 0.3 02/10/2022 1411       Impression and Plan: Ms. Nancy Rivera is a very pleasant 37 yo caucasian female with chronic almost occlusive right lower extremity DVT.  She has a couple hypercoagulable issues.  Pressure now is pregnant.  Due date is probably November 12.  I think she will have a baby  girl.  We will have to see what she has That we see her.  We will continue on the Lovenox.  She is doing well on Lovenox.  Her know that she has had the bleeding episode in the past.  I just hate that this happened  to her.  I would like to see her back in a couple months.  I think what had to keep a close eye on her throughout at this pregnancy.  Patient has 2 other children.  I am just happy that she we will have another 1.  I just  want to make sure that the baby is healthy and that she is also healthy.    Lottie Dawson, NP 3/15/20241:16 PM

## 2022-07-10 ENCOUNTER — Encounter: Payer: Self-pay | Admitting: *Deleted

## 2022-07-11 LAB — OB RESULTS CONSOLE ABO/RH: RH Type: POSITIVE

## 2022-07-11 LAB — OB RESULTS CONSOLE RUBELLA ANTIBODY, IGM: Rubella: IMMUNE

## 2022-07-11 LAB — OB RESULTS CONSOLE ANTIBODY SCREEN: Antibody Screen: NEGATIVE

## 2022-07-11 LAB — HEPATITIS C ANTIBODY: HCV Ab: NEGATIVE

## 2022-07-11 LAB — OB RESULTS CONSOLE GC/CHLAMYDIA
Chlamydia: NEGATIVE
Neisseria Gonorrhea: NEGATIVE

## 2022-07-11 LAB — OB RESULTS CONSOLE HEPATITIS B SURFACE ANTIGEN: Hepatitis B Surface Ag: NEGATIVE

## 2022-07-11 LAB — OB RESULTS CONSOLE HIV ANTIBODY (ROUTINE TESTING): HIV: NONREACTIVE

## 2022-07-24 DIAGNOSIS — O09899 Supervision of other high risk pregnancies, unspecified trimester: Secondary | ICD-10-CM | POA: Insufficient documentation

## 2022-07-24 DIAGNOSIS — O09891 Supervision of other high risk pregnancies, first trimester: Secondary | ICD-10-CM | POA: Insufficient documentation

## 2022-07-26 ENCOUNTER — Ambulatory Visit: Payer: 59

## 2022-07-26 ENCOUNTER — Other Ambulatory Visit: Payer: Self-pay | Admitting: *Deleted

## 2022-07-26 ENCOUNTER — Ambulatory Visit: Payer: 59 | Admitting: *Deleted

## 2022-07-26 ENCOUNTER — Ambulatory Visit: Payer: 59 | Attending: Maternal & Fetal Medicine

## 2022-07-26 VITALS — BP 112/70 | HR 60

## 2022-07-26 DIAGNOSIS — Z86718 Personal history of other venous thrombosis and embolism: Secondary | ICD-10-CM

## 2022-07-26 DIAGNOSIS — O09899 Supervision of other high risk pregnancies, unspecified trimester: Secondary | ICD-10-CM | POA: Insufficient documentation

## 2022-07-26 DIAGNOSIS — O09891 Supervision of other high risk pregnancies, first trimester: Secondary | ICD-10-CM | POA: Diagnosis not present

## 2022-07-26 DIAGNOSIS — D6861 Antiphospholipid syndrome: Secondary | ICD-10-CM | POA: Diagnosis not present

## 2022-07-26 DIAGNOSIS — O09521 Supervision of elderly multigravida, first trimester: Secondary | ICD-10-CM

## 2022-07-26 DIAGNOSIS — O34219 Maternal care for unspecified type scar from previous cesarean delivery: Secondary | ICD-10-CM

## 2022-07-26 DIAGNOSIS — O99111 Other diseases of the blood and blood-forming organs and certain disorders involving the immune mechanism complicating pregnancy, first trimester: Secondary | ICD-10-CM

## 2022-07-26 DIAGNOSIS — O99211 Obesity complicating pregnancy, first trimester: Secondary | ICD-10-CM

## 2022-07-26 DIAGNOSIS — Z7901 Long term (current) use of anticoagulants: Secondary | ICD-10-CM

## 2022-07-26 DIAGNOSIS — O10911 Unspecified pre-existing hypertension complicating pregnancy, first trimester: Secondary | ICD-10-CM

## 2022-07-26 DIAGNOSIS — O10011 Pre-existing essential hypertension complicating pregnancy, first trimester: Secondary | ICD-10-CM

## 2022-07-26 DIAGNOSIS — O09511 Supervision of elderly primigravida, first trimester: Secondary | ICD-10-CM

## 2022-07-26 DIAGNOSIS — E669 Obesity, unspecified: Secondary | ICD-10-CM

## 2022-07-26 DIAGNOSIS — Z3A12 12 weeks gestation of pregnancy: Secondary | ICD-10-CM

## 2022-07-26 DIAGNOSIS — O09211 Supervision of pregnancy with history of pre-term labor, first trimester: Secondary | ICD-10-CM

## 2022-07-26 DIAGNOSIS — Z362 Encounter for other antenatal screening follow-up: Secondary | ICD-10-CM

## 2022-08-01 ENCOUNTER — Ambulatory Visit: Payer: 59 | Admitting: Family

## 2022-08-01 ENCOUNTER — Inpatient Hospital Stay: Payer: 59

## 2022-08-04 LAB — PANORAMA PRENATAL TEST FULL PANEL:PANORAMA TEST PLUS 5 ADDITIONAL MICRODELETIONS: FETAL FRACTION: 2.6

## 2022-08-07 ENCOUNTER — Telehealth: Payer: Self-pay | Admitting: Obstetrics and Gynecology

## 2022-08-07 NOTE — Telephone Encounter (Signed)
Left voicemail for Ms. Jarzabek to call back regarding results of Panorama testing.  Results showed low fetal fraction and therefore a repeat sample was requested.  Cherly Anderson, MS, CGC

## 2022-08-09 ENCOUNTER — Telehealth: Payer: Self-pay | Admitting: Obstetrics and Gynecology

## 2022-08-09 NOTE — Telephone Encounter (Signed)
I spoke with Ms. Poupard to let her know the results of the recent Panorama which showed low fetal fraction (2.6%).  She informed me that she had another Panorama drawn earlier at [facility] and it was also low fetal fraction. The patient has an increased BMI and is taking blood thinners in this pregnancy, both of which may contribute to these results.  We reviewed the following options given these results as well as maternal age (37yo): Repeat the Panorama again, though it may continue to not be able to provide a result. Proceed with anatomy ultrasound at [redacted] weeks gestation and make decisions about further testing based upon any possible differences seen at that time. Plan for amniocentesis for chromosome evaluation.  The risks, benefits and limitations of this test were briefly reviewed.  The patient was encourage to let us know if she desires additional testing through our clinic. She stated that she would speak with her husband as well as her OB provider.  We can be reached at [phone].  Cherly Anderson, MS, CGC

## 2022-09-07 ENCOUNTER — Ambulatory Visit: Payer: 59 | Admitting: Hematology & Oncology

## 2022-09-07 ENCOUNTER — Inpatient Hospital Stay: Payer: 59

## 2022-09-13 ENCOUNTER — Ambulatory Visit: Payer: 59 | Attending: Obstetrics | Admitting: Obstetrics

## 2022-09-13 ENCOUNTER — Ambulatory Visit: Payer: 59 | Admitting: *Deleted

## 2022-09-13 ENCOUNTER — Ambulatory Visit: Payer: 59 | Attending: Obstetrics

## 2022-09-13 ENCOUNTER — Other Ambulatory Visit: Payer: Self-pay | Admitting: *Deleted

## 2022-09-13 VITALS — BP 135/73 | HR 61

## 2022-09-13 DIAGNOSIS — O99212 Obesity complicating pregnancy, second trimester: Secondary | ICD-10-CM | POA: Diagnosis not present

## 2022-09-13 DIAGNOSIS — O2232 Deep phlebothrombosis in pregnancy, second trimester: Secondary | ICD-10-CM

## 2022-09-13 DIAGNOSIS — O99211 Obesity complicating pregnancy, first trimester: Secondary | ICD-10-CM | POA: Insufficient documentation

## 2022-09-13 DIAGNOSIS — O99112 Other diseases of the blood and blood-forming organs and certain disorders involving the immune mechanism complicating pregnancy, second trimester: Secondary | ICD-10-CM | POA: Diagnosis not present

## 2022-09-13 DIAGNOSIS — O10912 Unspecified pre-existing hypertension complicating pregnancy, second trimester: Secondary | ICD-10-CM

## 2022-09-13 DIAGNOSIS — Z3A19 19 weeks gestation of pregnancy: Secondary | ICD-10-CM

## 2022-09-13 DIAGNOSIS — Z86718 Personal history of other venous thrombosis and embolism: Secondary | ICD-10-CM | POA: Insufficient documentation

## 2022-09-13 DIAGNOSIS — O09522 Supervision of elderly multigravida, second trimester: Secondary | ICD-10-CM

## 2022-09-13 DIAGNOSIS — O10012 Pre-existing essential hypertension complicating pregnancy, second trimester: Secondary | ICD-10-CM | POA: Diagnosis not present

## 2022-09-13 DIAGNOSIS — D6861 Antiphospholipid syndrome: Secondary | ICD-10-CM

## 2022-09-13 DIAGNOSIS — O99119 Other diseases of the blood and blood-forming organs and certain disorders involving the immune mechanism complicating pregnancy, unspecified trimester: Secondary | ICD-10-CM

## 2022-09-13 DIAGNOSIS — O09212 Supervision of pregnancy with history of pre-term labor, second trimester: Secondary | ICD-10-CM

## 2022-09-13 DIAGNOSIS — Z362 Encounter for other antenatal screening follow-up: Secondary | ICD-10-CM | POA: Diagnosis present

## 2022-09-13 DIAGNOSIS — O10911 Unspecified pre-existing hypertension complicating pregnancy, first trimester: Secondary | ICD-10-CM | POA: Diagnosis present

## 2022-09-13 DIAGNOSIS — E669 Obesity, unspecified: Secondary | ICD-10-CM

## 2022-09-13 DIAGNOSIS — O280 Abnormal hematological finding on antenatal screening of mother: Secondary | ICD-10-CM

## 2022-09-13 NOTE — Progress Notes (Signed)
MFM Note  Nancy Rivera was seen for a detailed fetal anatomy scan due to advanced maternal age (37 years old), maternal obesity with a BMI of 45, and history of antiphospholipid antibody syndrome with a prior DVT.  She is currently treated with therapeutic Lovenox 150 mg twice a day and daily baby aspirin.  She denies any problems in her current pregnancy.    She has had 3 cell free DNA tests  drawn in her current pregnancy which all showed no results due to insufficient fetal DNA.  She was informed that the fetal growth and amniotic fluid level were appropriate for her gestational age.   There were no obvious fetal anomalies noted on today's ultrasound exam.  Today's exam was limited due to maternal body habitus and the fetal position.  The patient was informed that anomalies may be missed due to technical limitations. If the fetus is in a suboptimal position or maternal habitus is increased, visualization of the fetus in the maternal uterus may be impaired.  The following were discussed during today's consultation:  Advanced maternal age with cell free DNA tests showing no results  The increased risk of fetal aneuploidy due to advanced maternal age was discussed.   Due to advanced maternal age, the patient was offered and declined an amniocentesis today for definitive diagnosis of fetal aneuploidy.  She reports that she is comfortable with today's normal fetal anatomy scan.  Due to maternal obesity, advanced maternal age, and the antiphospholipid antibody syndrome, weekly fetal testing should be started at around 32 weeks.  Antiphospholipid antibody syndrome  She was advised to continue treatment with Lovenox for the duration of her pregnancy. In regards to the management of her anticoagulation and delivery, she should continue therapeutic Lovenox up until the day before her scheduled delivery.    As she is treated with a therapeutic dose of anticoagulation, she may be eligible to  receive regional anesthesia if it has been 24 hours or more since she received the anticoagulation medication.    She should have an anesthesia consult when she is admitted for delivery.  We will continue to follow her with growth ultrasounds throughout her pregnancy.    A follow-up exam was scheduled in 4 weeks.    The patient and her husband stated that all of their questions were answered today.  A total of 30 minutes was spent counseling and coordinating the care for this patient.  Greater than 50% of the time was spent in direct face-to-face contact.

## 2022-10-06 ENCOUNTER — Encounter: Payer: Self-pay | Admitting: *Deleted

## 2022-10-11 IMAGING — US US EXTREM LOW VENOUS*R*
2 series · 13 of 24 positions shown · non-contrast
Comparison: None.

CLINICAL DATA: recurrent DVT, assess repsone to Lovenox.



[Series 1: us extrem low venous*right* · 12 of 45 slices shown (1 of 2)]
[im 1/45]
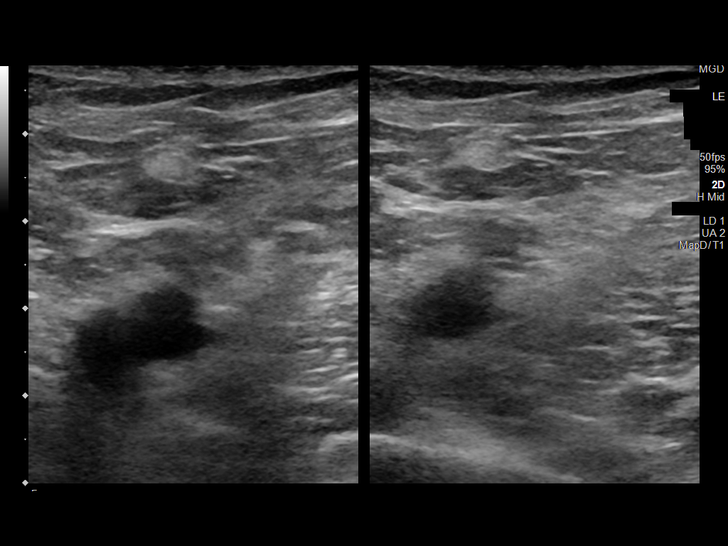
[im 5/45]
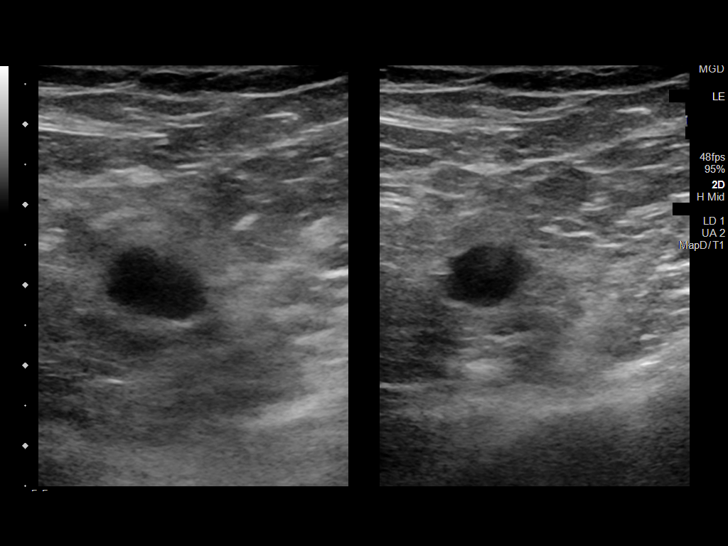
[im 9/45]
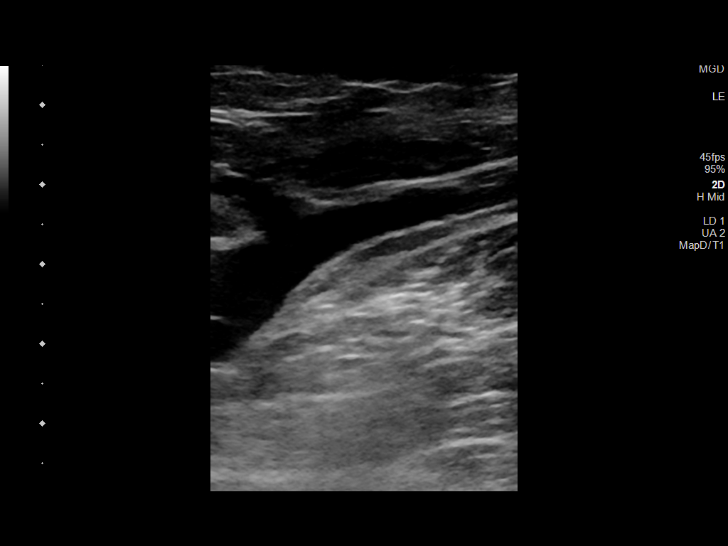
[im 13/45]
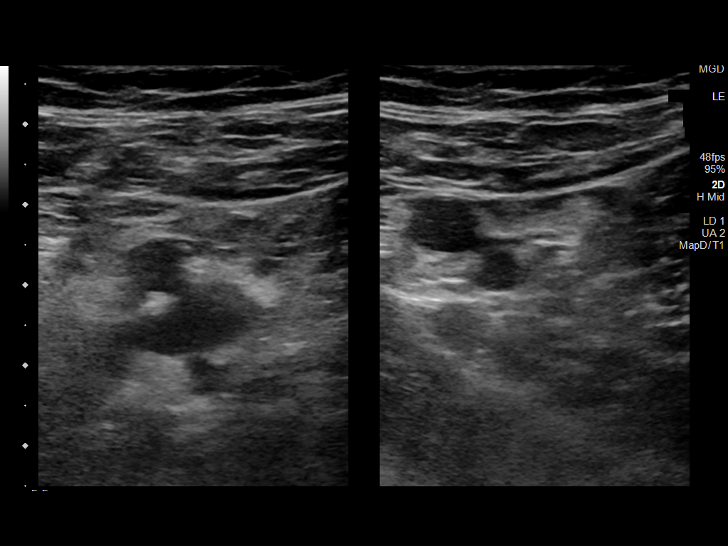
[im 17/45]
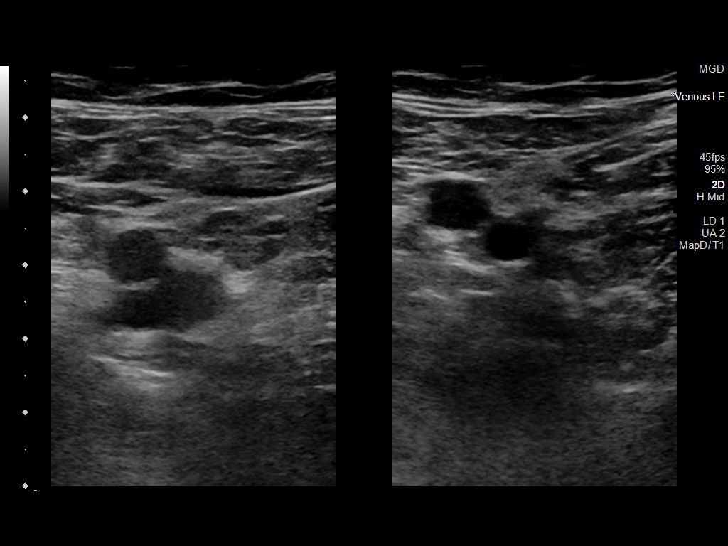
[im 21/45]
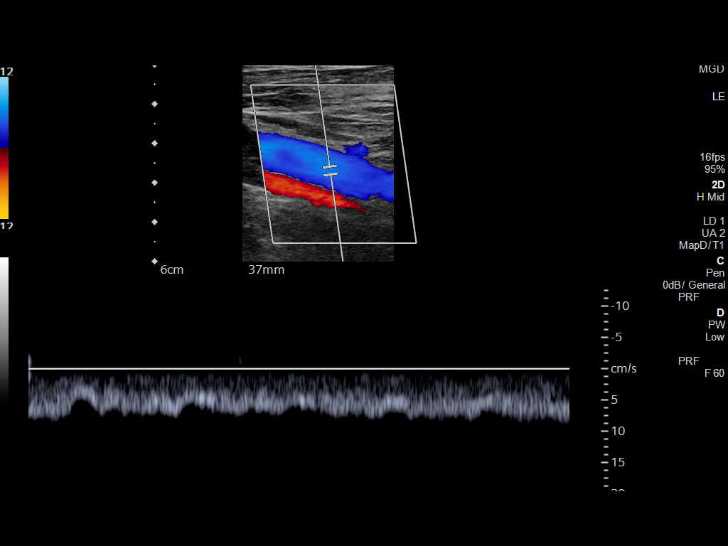
[im 25/45]
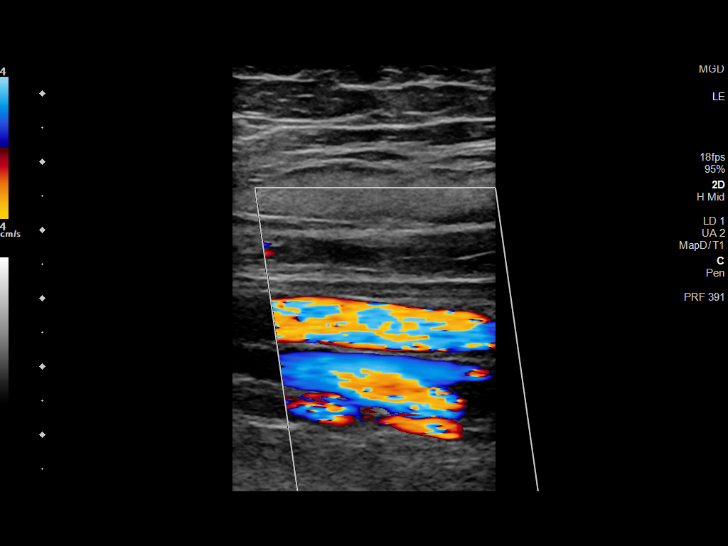
[im 27/45]
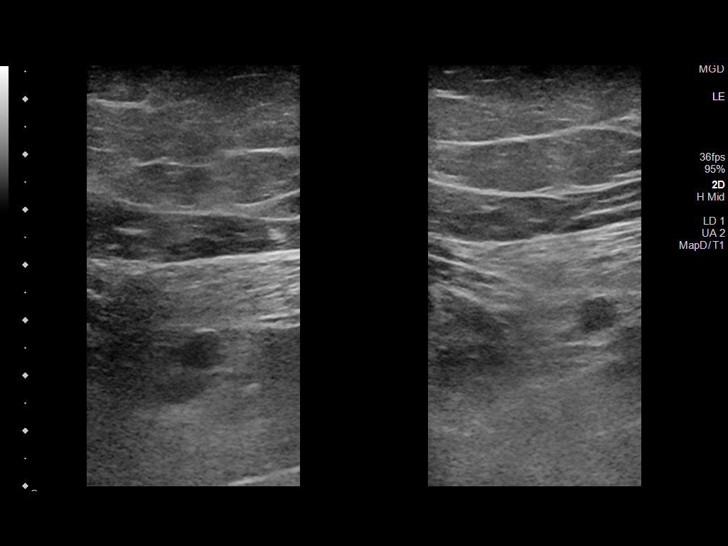
[im 31/45]
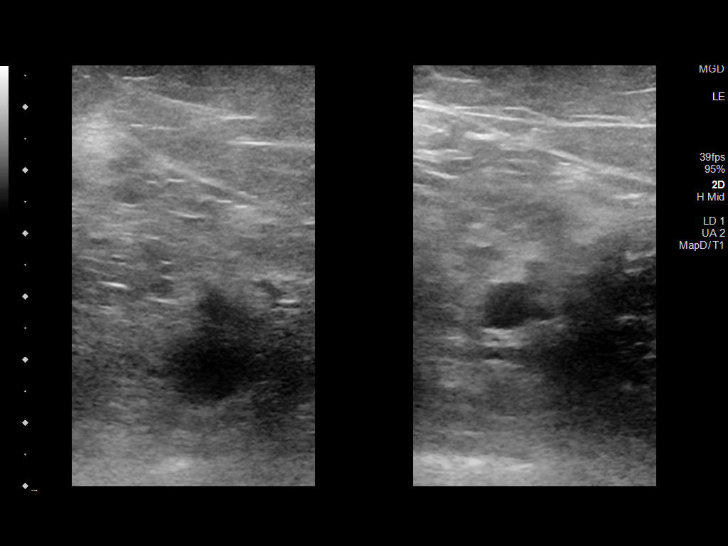
[im 35/45]
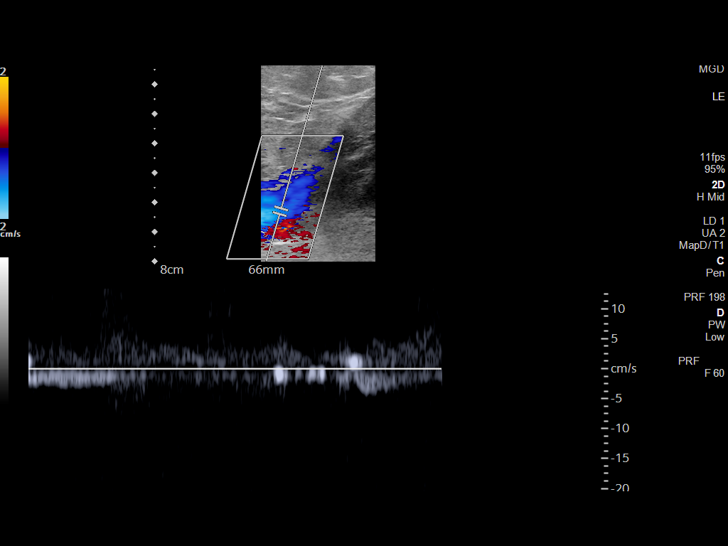
[im 39/45]
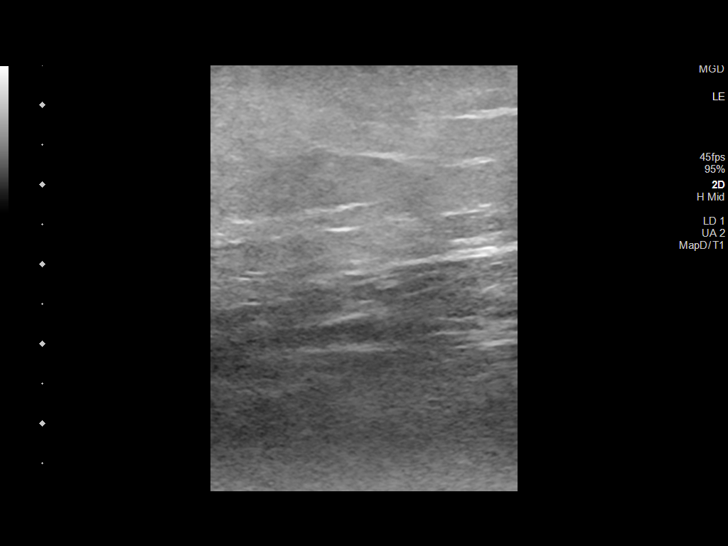
[im 43/45]
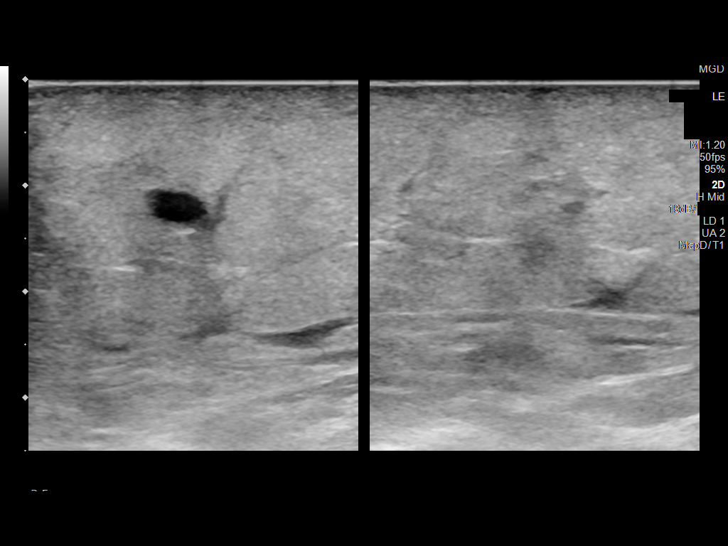

[Series 2: us extrem low venous*right* · 1 of 1 slices shown (2 of 2)]
[im 1/1]
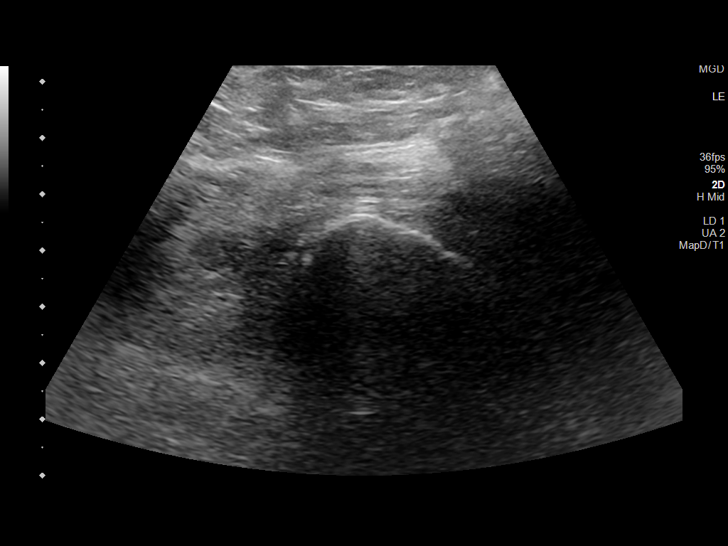

[13 of 24 positions shown; findings below may reference images not displayed]

FINDINGS: Contralateral Common Femoral Vein: Respiratory phasicity is normal
and symmetric with the symptomatic side. No evidence of thrombus.
Normal compressibility.

Common Femoral Vein: No evidence of thrombus. Normal
compressibility, respiratory phasicity.

Saphenofemoral Junction: No evidence of thrombus. Normal
compressibility and flow on color Doppler imaging.

Profunda Femoral Vein: No evidence of thrombus. Normal
compressibility and flow on color Doppler imaging.

Femoral Vein: Proximal superficial femoral vein with persistent
synechaie from prior deep venous thrombosis noted. Otherwise no
evidence of thrombus. Normal compressibility, respiratory phasicity.

Popliteal Vein: Persistent synechaie from prior deep venous
thrombosis noted. No evidence of thrombus. Normal compressibility,
respiratory phasicity.

Calf Veins: No evidence of thrombus. Normal compressibility and flow
on color Doppler imaging.

Superficial Great Saphenous Vein: No evidence of thrombus. Normal
compressibility.

Venous Reflux:  None.

Other Findings:  None.
IMPRESSION: 1. No evidence of acute deep venous thrombosis of the right lower
extremity.
2. Persistent synechaie from prior deep venous thrombosis within the
superficial femoral vein proximally and the popliteal vein.

## 2022-10-12 ENCOUNTER — Ambulatory Visit: Payer: 59 | Attending: Obstetrics

## 2022-10-12 ENCOUNTER — Ambulatory Visit: Payer: 59 | Admitting: *Deleted

## 2022-10-12 ENCOUNTER — Other Ambulatory Visit: Payer: Self-pay | Admitting: *Deleted

## 2022-10-12 VITALS — BP 128/73 | HR 73

## 2022-10-12 DIAGNOSIS — O09522 Supervision of elderly multigravida, second trimester: Secondary | ICD-10-CM | POA: Insufficient documentation

## 2022-10-12 DIAGNOSIS — O99212 Obesity complicating pregnancy, second trimester: Secondary | ICD-10-CM | POA: Diagnosis present

## 2022-10-12 DIAGNOSIS — O09212 Supervision of pregnancy with history of pre-term labor, second trimester: Secondary | ICD-10-CM

## 2022-10-12 DIAGNOSIS — O280 Abnormal hematological finding on antenatal screening of mother: Secondary | ICD-10-CM | POA: Diagnosis not present

## 2022-10-12 DIAGNOSIS — O358XX0 Maternal care for other (suspected) fetal abnormality and damage, not applicable or unspecified: Secondary | ICD-10-CM | POA: Diagnosis not present

## 2022-10-12 DIAGNOSIS — O10912 Unspecified pre-existing hypertension complicating pregnancy, second trimester: Secondary | ICD-10-CM

## 2022-10-12 DIAGNOSIS — O2232 Deep phlebothrombosis in pregnancy, second trimester: Secondary | ICD-10-CM | POA: Insufficient documentation

## 2022-10-12 DIAGNOSIS — O34219 Maternal care for unspecified type scar from previous cesarean delivery: Secondary | ICD-10-CM

## 2022-10-12 DIAGNOSIS — O99112 Other diseases of the blood and blood-forming organs and certain disorders involving the immune mechanism complicating pregnancy, second trimester: Secondary | ICD-10-CM

## 2022-10-12 DIAGNOSIS — O10012 Pre-existing essential hypertension complicating pregnancy, second trimester: Secondary | ICD-10-CM

## 2022-10-12 DIAGNOSIS — D6861 Antiphospholipid syndrome: Secondary | ICD-10-CM

## 2022-10-12 DIAGNOSIS — Z3A23 23 weeks gestation of pregnancy: Secondary | ICD-10-CM

## 2022-10-12 DIAGNOSIS — O99119 Other diseases of the blood and blood-forming organs and certain disorders involving the immune mechanism complicating pregnancy, unspecified trimester: Secondary | ICD-10-CM

## 2022-10-12 DIAGNOSIS — E669 Obesity, unspecified: Secondary | ICD-10-CM

## 2022-11-15 ENCOUNTER — Other Ambulatory Visit: Payer: Self-pay | Admitting: Family

## 2022-11-15 DIAGNOSIS — I82401 Acute embolism and thrombosis of unspecified deep veins of right lower extremity: Secondary | ICD-10-CM

## 2022-11-15 DIAGNOSIS — Z3491 Encounter for supervision of normal pregnancy, unspecified, first trimester: Secondary | ICD-10-CM

## 2022-11-15 DIAGNOSIS — D6859 Other primary thrombophilia: Secondary | ICD-10-CM

## 2022-11-16 ENCOUNTER — Ambulatory Visit: Payer: 59

## 2022-12-14 ENCOUNTER — Ambulatory Visit: Payer: 59

## 2023-01-04 ENCOUNTER — Encounter (HOSPITAL_COMMUNITY): Payer: Self-pay

## 2023-01-12 NOTE — Patient Instructions (Signed)
Nancy Rivera  01/12/2023   Your procedure is scheduled on:  01/17/2023  Arrive at 0530 at Entrance C on [employer] [address] at [facility]  and [employer]. You are invited to use the FREE valet parking or use the Visitor's parking deck.  Pick up the phone at the desk and dial [phone].  Call this number if you have problems the morning of surgery: [phone]  Remember:   Do not eat food:(4 Hours before arrival) 4 horas ante llegada.  Do not drink clear liquids: (4 Hours before arrival) 4 horas ante llegada.You may drink clear liquids until arrival at __0530___.  Clear liquids means a liquid you can see thru.  It can have color such as Cola or Kool aid.  Tea is OK and coffee as long as no milk or creamer of any kind.  Take these medicines the morning of surgery with A SIP OF WATER:  Take labetalol as prescribed. NO HEPARIN ON 10/22 OR 10/23   Do not wear jewelry, make-up or nail polish.  Do not wear lotions, powders, or perfumes. Do not wear deodorant.  Do not shave 48 hours prior to surgery.  Do not bring valuables to the hospital.  [facility] is not   responsible for any belongings or valuables brought to the hospital.  Contacts, dentures or bridgework may not be worn into surgery.  Leave suitcase in the car. After surgery it may be brought to your room.  For patients admitted to the hospital, checkout time is 11:00 AM the day of              discharge.      Please read over the following fact sheets that you were given:     Preparing for Surgery

## 2023-01-15 ENCOUNTER — Encounter (HOSPITAL_COMMUNITY)
Admission: RE | Admit: 2023-01-15 | Discharge: 2023-01-15 | Disposition: A | Discharge status: Home / Self Care | Payer: 59 | Source: Ambulatory Visit | Attending: Obstetrics & Gynecology | Admitting: Obstetrics & Gynecology

## 2023-01-15 DIAGNOSIS — Z3A00 Weeks of gestation of pregnancy not specified: Secondary | ICD-10-CM | POA: Insufficient documentation

## 2023-01-15 DIAGNOSIS — Z01812 Encounter for preprocedural laboratory examination: Secondary | ICD-10-CM | POA: Insufficient documentation

## 2023-01-15 DIAGNOSIS — O34219 Maternal care for unspecified type scar from previous cesarean delivery: Secondary | ICD-10-CM | POA: Insufficient documentation

## 2023-01-15 HISTORY — DX: Personal history of other complications of pregnancy, childbirth and the puerperium: Z87.59

## 2023-01-15 LAB — COMPREHENSIVE METABOLIC PANEL
ALT: 17 U/L (ref 0–44)
AST: 18 U/L (ref 15–41)
Albumin: 2.7 g/dL — ABNORMAL LOW (ref 3.5–5.0)
Alkaline Phosphatase: 95 U/L (ref 38–126)
Anion gap: 8 (ref 5–15)
BUN: 12 mg/dL (ref 6–20)
CO2: 20 mmol/L — ABNORMAL LOW (ref 22–32)
Calcium: 8.5 mg/dL — ABNORMAL LOW (ref 8.9–10.3)
Chloride: 110 mmol/L (ref 98–111)
Creatinine, Ser: 1.17 mg/dL — ABNORMAL HIGH (ref 0.44–1.00)
GFR, Estimated: >60 mL/min (ref >60)
Glucose, Bld: 83 mg/dL (ref 70–99)
Potassium: 4.1 mmol/L (ref 3.5–5.1)
Sodium: 138 mmol/L (ref 135–145)
Total Bilirubin: 0.5 mg/dL (ref 0.3–1.2)
Total Protein: 6.2 g/dL — ABNORMAL LOW (ref 6.5–8.1)

## 2023-01-15 LAB — CBC
HCT: 36.2 % (ref 36.0–46.0)
Hemoglobin: 11.6 g/dL — ABNORMAL LOW (ref 12.0–15.0)
MCH: 27.2 pg (ref 26.0–34.0)
MCHC: 32 g/dL (ref 30.0–36.0)
MCV: 85 fL (ref 80.0–100.0)
Platelets: 153 10*3/uL (ref 150–400)
RBC: 4.26 MIL/uL (ref 3.87–5.11)
RDW: 14.6 % (ref 11.5–15.5)
WBC: 10.7 10*3/uL — ABNORMAL HIGH (ref 4.0–10.5)
nRBC: 0 % (ref 0.0–0.2)

## 2023-01-15 LAB — TYPE AND SCREEN
ABO/RH(D): A POS
Antibody Screen: NEGATIVE

## 2023-01-15 NOTE — H&P (Signed)
Nancy Rivera is a 37 y.o. female G3P1102 at 37 weeks presenting for repeat C/S with BTL.    Patient has complicated PMH for the following:   History of a RLE DVT that occurred 2 months after the birth of her son in 2022.  She was taking estrogen-based birth control at the time. She was treated with Heparin IV before transitioning to Lovenox and going home on Xarelto. Vascular specialist felt appropriate to discontinue Xarelto and bridge with Lovenox prior to starting Coumadin. In September 2022 she had a very high INR on Coumadin and was changed to Eliquis. Thrombophilia testing showed she was lupus anticoagulant positive and was heterozygous for the prothrombin gene mutation. In January of 2023 she went to urgent care with leg pain/swelling and was diagnosed with a new DVT despite being on Eliquis.  She was then transition to Lovenox 140 mg SQ twice daily. In April for 2023 she had  repeat US of the right leg that showed no evidence of DVT but she did have persistent synechiae from the previous DVT within the superficial femoral vein proximally and the popliteal vein. Unfortunately while on Lovenox she devolved a left retroperitoneal bleed and was life-flown to [facility] back in November 2023. Prior to the event she had been having back pain and then had what sounds like a syncopal episode while straining with constipation prior to diagnosis.  At this time she was then transitioned to Coumadin which she stopped on with positive pregnancy test.   Patient hast taken therapeutic Lovenox during this pregnancy and has been managed by Dr. Ottis Stain of hematology.  She transitioned to unfractionated heparin about 3 weeks ago.  She was instructed to hold heparin 24 hours prior to surgery.  She has also daily daily low dose ASA.    With early warfarin exposure, patient had normal fetal ECHO and no identified structural abnormalities on u/s.    Patient also has CHTN which has been well controlled on labetalol 50 mg BID.   Last growth u/s was 10/15 with EFW 6#5 (56%), AFI 11.4 cm and BPP 8/8.    Patient has anxiety which has been well controlled on sertraline.  Patient has obesity.  H/O PTD at 35 weeks for [facility] with NRFHT.  AMA with inconclusive NIPS results secondary to insufficient fetal DNA x 3 attempts.     OB History     Gravida  3   Para  2   Term  1   Preterm  1   AB      Living  2      SAB      IAB      Ectopic      Multiple  0   Live Births  2          Past Medical History:  Diagnosis Date   Acute deep vein thrombosis (DVT) (HCC) 09/02/2020   Anxiety    Cesarean delivery delivered 07/05/2020   Depression 09/02/2020   DVT (deep venous thrombosis) (HCC)    History of pre-eclampsia    HTN (hypertension) 09/02/2020   Hypertension    Obesity    Oligomenorrhea    PCOS (polycystic ovarian syndrome)    Polycystic ovary syndrome 06/13/2022   Metformin D/Cd at 13 weeks   Preeclampsia 07/11/2013   Traumatic retroperitoneal hematoma 03/02/2022   Uterine cancer (HCC)    Vaginal delivery 07/05/2020   2015/ IOL, PIH, 38wga, F, 6#, [place]     Past Surgical History:  Procedure Laterality  Date   abdominal embolization     Fall 2023 bleeding around L kidney   Benign cyst removed from eyebrow as an infant     CESAREAN SECTION N/A 07/05/2020   Procedure: CESAREAN SECTION;  Surgeon: Harold Hedge, MD;  Location: [facility] LD ORS;  Service: Obstetrics;  Laterality: N/A;   REMOVAL OF CYST  1985   FROM UNDER LEFT EYEBROW   Family History: family history includes Cancer in her maternal grandfather and paternal grandfather. Social History:  reports that she quit smoking about 3 years ago. Her smoking use included cigarettes. She started smoking about 18 years ago. She has a 7.5 pack-year smoking history. She has never used smokeless tobacco. She reports that she does not drink alcohol and does not use drugs.     Maternal Diabetes: No Genetic Screening: Abnormal:  Results: Other:  insufficient fetal fraction Maternal Ultrasounds/Referrals: Normal Fetal Ultrasounds or other Referrals:  Fetal echo, Referred to Materal Fetal Medicine  Maternal Substance Abuse:  No Significant Maternal Medications:  Meds include: Other: sertraline, labetalol, heparin Significant Maternal Lab Results:  None Number of Prenatal Visits:greater than 3 verified prenatal visits Maternal Vaccinations:TDap Other Comments:  None  Review of Systems Maternal Medical History:  Prenatal complications: PIH and thrombophilia.   Prenatal Complications - Diabetes: none.     Last menstrual period 05/02/2022, unknown if currently breastfeeding. Maternal Exam:  Abdomen: Patient reports no abdominal tenderness. Surgical scars: low transverse.   Fundal height is c/w dates.   Estimated fetal weight is 7#.     Physical Exam Constitutional:      Appearance: Normal appearance.  HENT:     Head: Normocephalic and atraumatic.  Pulmonary:     Effort: Pulmonary effort is normal.  Abdominal:     Palpations: Abdomen is soft.  Musculoskeletal:        General: Normal range of motion.     Cervical back: Normal range of motion.  Skin:    General: Skin is warm and dry.  Neurological:     Mental Status: She is alert and oriented to person, place, and time.  Psychiatric:        Mood and Affect: Mood normal.        Behavior: Behavior normal.     Prenatal labs: ABO, Rh:  A pos Antibody:  Negative Rubella:  Immune RPR:   NR HBsAg:   Negative HIV:   NR GBS:   unknown  Assessment/Plan: 37 yo [id number] for repeat C/S with BTL for CHTN, hypercoagulability, desire for sterility Patient was instructed to hold heparin 24 h prior to C/S PTT to be collected on admission Plan repeat C/S with BTL.  Patient is counseled re: risk of bleeding, infection, scarring and damage to surrounding structures.  She is informed of BTL with risk of failure, regret and ectopic.  She is informed of steps of procedure as well as  postop expectations and limitations.  All questions were answered and patient wishes to proceed. Plan to restart therapeutic LMWH 24 h following procedure  [name] 01/15/2023, 8:11 AM

## 2023-01-16 LAB — RPR: RPR Ser Ql: NONREACTIVE

## 2023-01-17 ENCOUNTER — Inpatient Hospital Stay (HOSPITAL_COMMUNITY)
Admission: AD | Admit: 2023-01-17 | Discharge: 2023-01-19 | DRG: 784 | Disposition: A | Discharge status: Home / Self Care | Payer: 59 | Attending: Obstetrics & Gynecology | Admitting: Obstetrics & Gynecology

## 2023-01-17 ENCOUNTER — Inpatient Hospital Stay (HOSPITAL_COMMUNITY): Payer: 59 | Admitting: Anesthesiology

## 2023-01-17 ENCOUNTER — Encounter (HOSPITAL_COMMUNITY)
Admission: AD | Disposition: A | Discharge status: Home / Self Care | Payer: Self-pay | Source: Home / Self Care | Attending: Obstetrics & Gynecology

## 2023-01-17 ENCOUNTER — Other Ambulatory Visit: Payer: Self-pay

## 2023-01-17 ENCOUNTER — Encounter (HOSPITAL_COMMUNITY): Payer: Self-pay | Admitting: Obstetrics & Gynecology

## 2023-01-17 DIAGNOSIS — O99214 Obesity complicating childbirth: Secondary | ICD-10-CM | POA: Diagnosis present

## 2023-01-17 DIAGNOSIS — Z86718 Personal history of other venous thrombosis and embolism: Secondary | ICD-10-CM

## 2023-01-17 DIAGNOSIS — O99354 Diseases of the nervous system complicating childbirth: Secondary | ICD-10-CM | POA: Diagnosis present

## 2023-01-17 DIAGNOSIS — O9912 Other diseases of the blood and blood-forming organs and certain disorders involving the immune mechanism complicating childbirth: Secondary | ICD-10-CM | POA: Diagnosis present

## 2023-01-17 DIAGNOSIS — O9902 Anemia complicating childbirth: Secondary | ICD-10-CM | POA: Diagnosis present

## 2023-01-17 DIAGNOSIS — Z3A37 37 weeks gestation of pregnancy: Secondary | ICD-10-CM

## 2023-01-17 DIAGNOSIS — Z8542 Personal history of malignant neoplasm of other parts of uterus: Secondary | ICD-10-CM

## 2023-01-17 DIAGNOSIS — Z98891 History of uterine scar from previous surgery: Secondary | ICD-10-CM

## 2023-01-17 DIAGNOSIS — D6862 Lupus anticoagulant syndrome: Secondary | ICD-10-CM | POA: Diagnosis present

## 2023-01-17 DIAGNOSIS — O1092 Unspecified pre-existing hypertension complicating childbirth: Secondary | ICD-10-CM | POA: Diagnosis present

## 2023-01-17 DIAGNOSIS — G4730 Sleep apnea, unspecified: Secondary | ICD-10-CM | POA: Diagnosis present

## 2023-01-17 DIAGNOSIS — F419 Anxiety disorder, unspecified: Secondary | ICD-10-CM | POA: Diagnosis present

## 2023-01-17 DIAGNOSIS — Z87891 Personal history of nicotine dependence: Secondary | ICD-10-CM

## 2023-01-17 DIAGNOSIS — Z7901 Long term (current) use of anticoagulants: Secondary | ICD-10-CM | POA: Diagnosis not present

## 2023-01-17 DIAGNOSIS — O34211 Maternal care for low transverse scar from previous cesarean delivery: Secondary | ICD-10-CM | POA: Diagnosis present

## 2023-01-17 DIAGNOSIS — O99344 Other mental disorders complicating childbirth: Secondary | ICD-10-CM | POA: Diagnosis present

## 2023-01-17 DIAGNOSIS — Z302 Encounter for sterilization: Secondary | ICD-10-CM | POA: Diagnosis not present

## 2023-01-17 DIAGNOSIS — O34219 Maternal care for unspecified type scar from previous cesarean delivery: Principal | ICD-10-CM

## 2023-01-17 LAB — CBC
HCT: 34.2 % — ABNORMAL LOW (ref 36.0–46.0)
Hemoglobin: 11.4 g/dL — ABNORMAL LOW (ref 12.0–15.0)
MCH: 27.2 pg (ref 26.0–34.0)
MCHC: 33.3 g/dL (ref 30.0–36.0)
MCV: 81.6 fL (ref 80.0–100.0)
Platelets: 131 10*3/uL — ABNORMAL LOW (ref 150–400)
RBC: 4.19 MIL/uL (ref 3.87–5.11)
RDW: 14.4 % (ref 11.5–15.5)
WBC: 13.1 10*3/uL — ABNORMAL HIGH (ref 4.0–10.5)
nRBC: 0 % (ref 0.0–0.2)

## 2023-01-17 LAB — CREATININE, SERUM
Creatinine, Ser: 0.92 mg/dL (ref 0.44–1.00)
GFR, Estimated: >60 mL/min (ref >60)

## 2023-01-17 SURGERY — Surgical Case
Anesthesia: Spinal | Laterality: Bilateral

## 2023-01-17 MED ORDER — SODIUM CHLORIDE 0.9 % IV SOLN: 12.5000 mg | INTRAVENOUS | Status: DC | PRN

## 2023-01-17 MED ORDER — SODIUM CHLORIDE 0.9% FLUSH
3.0000 mL | INTRAVENOUS | Status: DC | PRN
Start: 2023-01-17 — End: 2023-01-19

## 2023-01-17 MED ORDER — COCONUT OIL OIL: 1.0000 | TOPICAL_OIL | Status: DC | PRN

## 2023-01-17 MED ORDER — CEFAZOLIN IN SODIUM CHLORIDE 3-0.9 GM/100ML-% IV SOLN
3.0000 g | INTRAVENOUS | Status: AC
  Administered 2023-01-17: 3 g via INTRAVENOUS

## 2023-01-17 MED ORDER — ENOXAPARIN SODIUM 150 MG/ML IJ SOSY
1.0000 mg/kg | PREFILLED_SYRINGE | Freq: Two times a day (BID) | INTRAMUSCULAR | Status: DC
  Filled 2023-01-17: qty 1

## 2023-01-17 MED ORDER — ACETAMINOPHEN 500 MG PO TABS
1000.0000 mg | ORAL_TABLET | Freq: Four times a day (QID) | ORAL | Status: DC
  Administered 2023-01-17 – 2023-01-19 (×8): 1000 mg via ORAL
  Filled 2023-01-17 (×8): qty 2

## 2023-01-17 MED ORDER — ONDANSETRON HCL 4 MG/2ML IJ SOLN: 4.0000 mg | Freq: Three times a day (TID) | INTRAMUSCULAR | Status: DC | PRN

## 2023-01-17 MED ORDER — IBUPROFEN 600 MG PO TABS
600.0000 mg | ORAL_TABLET | Freq: Four times a day (QID) | ORAL | Status: DC
  Administered 2023-01-17 – 2023-01-19 (×7): 600 mg via ORAL
  Filled 2023-01-17 (×7): qty 1

## 2023-01-17 MED ORDER — PHENYLEPHRINE HCL-NACL 20-0.9 MG/250ML-% IV SOLN
INTRAVENOUS | Status: DC | PRN
  Administered 2023-01-17: 60 ug/min via INTRAVENOUS

## 2023-01-17 MED ORDER — MORPHINE SULFATE (PF) 0.5 MG/ML IJ SOLN
INTRAMUSCULAR | Status: DC | PRN
  Administered 2023-01-17: 150 ug via INTRATHECAL

## 2023-01-17 MED ORDER — NALOXONE HCL 0.4 MG/ML IJ SOLN: 0.4000 mg | INTRAMUSCULAR | Status: DC | PRN

## 2023-01-17 MED ORDER — FENTANYL CITRATE (PF) 100 MCG/2ML IJ SOLN
INTRAMUSCULAR | Status: AC
  Filled 2023-01-17: qty 2

## 2023-01-17 MED ORDER — OXYTOCIN-SODIUM CHLORIDE 30-0.9 UT/500ML-% IV SOLN
2.5000 [IU]/h | INTRAVENOUS | Status: AC
  Administered 2023-01-17: 2.5 [IU]/h via INTRAVENOUS
  Filled 2023-01-17: qty 500

## 2023-01-17 MED ORDER — DEXAMETHASONE SODIUM PHOSPHATE 4 MG/ML IJ SOLN
INTRAMUSCULAR | Status: AC
  Filled 2023-01-17: qty 1

## 2023-01-17 MED ORDER — DEXAMETHASONE SODIUM PHOSPHATE 10 MG/ML IJ SOLN
INTRAMUSCULAR | Status: DC | PRN
  Administered 2023-01-17: 4 mg via INTRAVENOUS

## 2023-01-17 MED ORDER — SIMETHICONE 80 MG PO CHEW
80.0000 mg | CHEWABLE_TABLET | Freq: Three times a day (TID) | ORAL | Status: DC
  Administered 2023-01-17 – 2023-01-19 (×6): 80 mg via ORAL
  Filled 2023-01-17 (×6): qty 1

## 2023-01-17 MED ORDER — LACTATED RINGERS IV SOLN: INTRAVENOUS | Status: DC

## 2023-01-17 MED ORDER — MEPERIDINE HCL 25 MG/ML IJ SOLN: 6.2500 mg | INTRAMUSCULAR | Status: DC | PRN

## 2023-01-17 MED ORDER — CEFAZOLIN IN SODIUM CHLORIDE 3-0.9 GM/100ML-% IV SOLN
INTRAVENOUS | Status: AC
  Filled 2023-01-17: qty 100

## 2023-01-17 MED ORDER — FENTANYL CITRATE (PF) 100 MCG/2ML IJ SOLN
INTRAMUSCULAR | Status: DC | PRN
  Administered 2023-01-17: 15 ug via INTRATHECAL

## 2023-01-17 MED ORDER — DIPHENHYDRAMINE HCL 25 MG PO CAPS: 25.0000 mg | ORAL_CAPSULE | Freq: Four times a day (QID) | ORAL | Status: DC | PRN

## 2023-01-17 MED ORDER — OXYCODONE HCL 5 MG PO TABS: 5.0000 mg | ORAL_TABLET | ORAL | Status: DC | PRN

## 2023-01-17 MED ORDER — SERTRALINE HCL 50 MG PO TABS
50.0000 mg | ORAL_TABLET | Freq: Every day | ORAL | Status: DC
  Administered 2023-01-17 – 2023-01-18 (×2): 50 mg via ORAL
  Filled 2023-01-17 (×2): qty 1

## 2023-01-17 MED ORDER — OXYCODONE HCL 5 MG PO TABS: 5.0000 mg | ORAL_TABLET | Freq: Once | ORAL | Status: DC | PRN

## 2023-01-17 MED ORDER — SCOPOLAMINE 1 MG/3DAYS TD PT72: 1.0000 | MEDICATED_PATCH | Freq: Once | TRANSDERMAL | Status: DC

## 2023-01-17 MED ORDER — FENTANYL CITRATE (PF) 100 MCG/2ML IJ SOLN: 25.0000 ug | INTRAMUSCULAR | Status: DC | PRN

## 2023-01-17 MED ORDER — SENNOSIDES-DOCUSATE SODIUM 8.6-50 MG PO TABS
2.0000 | ORAL_TABLET | Freq: Every day | ORAL | Status: DC
  Administered 2023-01-19: 2 via ORAL
  Filled 2023-01-17 (×2): qty 2

## 2023-01-17 MED ORDER — ONDANSETRON HCL 4 MG/2ML IJ SOLN
INTRAMUSCULAR | Status: AC
  Filled 2023-01-17: qty 2

## 2023-01-17 MED ORDER — NALOXONE HCL 4 MG/10ML IJ SOLN: 1.0000 ug/kg/h | INTRAVENOUS | Status: DC | PRN

## 2023-01-17 MED ORDER — KETOROLAC TROMETHAMINE 30 MG/ML IJ SOLN
30.0000 mg | Freq: Four times a day (QID) | INTRAMUSCULAR | Status: AC | PRN
Start: 2023-01-17 — End: 2023-01-18

## 2023-01-17 MED ORDER — LABETALOL HCL 100 MG PO TABS
50.0000 mg | ORAL_TABLET | Freq: Two times a day (BID) | ORAL | Status: DC
  Administered 2023-01-17 – 2023-01-18 (×3): 50 mg via ORAL
  Filled 2023-01-17 (×4): qty 1

## 2023-01-17 MED ORDER — ONDANSETRON HCL 4 MG/2ML IJ SOLN
INTRAMUSCULAR | Status: DC | PRN
  Administered 2023-01-17: 4 mg via INTRAVENOUS

## 2023-01-17 MED ORDER — BUPIVACAINE IN DEXTROSE 0.75-8.25 % IT SOLN
INTRATHECAL | Status: DC | PRN
  Administered 2023-01-17: 1.6 mL via INTRATHECAL

## 2023-01-17 MED ORDER — OXYTOCIN-SODIUM CHLORIDE 30-0.9 UT/500ML-% IV SOLN
INTRAVENOUS | Status: DC | PRN
  Administered 2023-01-17: 300 mL via INTRAVENOUS

## 2023-01-17 MED ORDER — DIPHENHYDRAMINE HCL 50 MG/ML IJ SOLN
12.5000 mg | INTRAMUSCULAR | Status: DC | PRN
  Filled 2023-01-17: qty 1

## 2023-01-17 MED ORDER — ACETAMINOPHEN 10 MG/ML IV SOLN
INTRAVENOUS | Status: DC | PRN
  Administered 2023-01-17: 1000 mg via INTRAVENOUS

## 2023-01-17 MED ORDER — OXYCODONE HCL 5 MG/5ML PO SOLN: 5.0000 mg | Freq: Once | ORAL | Status: DC | PRN

## 2023-01-17 MED ORDER — POVIDONE-IODINE 10 % EX SWAB
2.0000 | Freq: Once | CUTANEOUS | Status: AC
  Administered 2023-01-17: 2 via TOPICAL

## 2023-01-17 MED ORDER — MENTHOL 3 MG MT LOZG: 1.0000 | LOZENGE | OROMUCOSAL | Status: DC | PRN

## 2023-01-17 MED ORDER — FERROUS SULFATE 325 (65 FE) MG PO TABS
325.0000 mg | ORAL_TABLET | ORAL | Status: DC
  Administered 2023-01-17 – 2023-01-19 (×2): 325 mg via ORAL
  Filled 2023-01-17 (×2): qty 1

## 2023-01-17 MED ORDER — OXYTOCIN-SODIUM CHLORIDE 30-0.9 UT/500ML-% IV SOLN
INTRAVENOUS | Status: AC
  Filled 2023-01-17: qty 500

## 2023-01-17 MED ORDER — KETOROLAC TROMETHAMINE 30 MG/ML IJ SOLN
30.0000 mg | Freq: Four times a day (QID) | INTRAMUSCULAR | Status: AC | PRN
Start: 2023-01-17 — End: 2023-01-18
  Administered 2023-01-17: 30 mg via INTRAVENOUS
  Filled 2023-01-17: qty 1

## 2023-01-17 MED ORDER — ZOLPIDEM TARTRATE 5 MG PO TABS: 5.0000 mg | ORAL_TABLET | Freq: Every evening | ORAL | Status: DC | PRN

## 2023-01-17 MED ORDER — MORPHINE SULFATE (PF) 0.5 MG/ML IJ SOLN
INTRAMUSCULAR | Status: AC
  Filled 2023-01-17: qty 10

## 2023-01-17 MED ORDER — WITCH HAZEL-GLYCERIN EX PADS: 1.0000 | MEDICATED_PAD | CUTANEOUS | Status: DC | PRN

## 2023-01-17 MED ORDER — ENOXAPARIN SODIUM 150 MG/ML IJ SOSY
1.0000 mg/kg | PREFILLED_SYRINGE | Freq: Two times a day (BID) | INTRAMUSCULAR | Status: DC
  Administered 2023-01-17 – 2023-01-19 (×4): 150 mg via SUBCUTANEOUS
  Filled 2023-01-17 (×5): qty 1

## 2023-01-17 MED ORDER — PHENYLEPHRINE HCL-NACL 20-0.9 MG/250ML-% IV SOLN
INTRAVENOUS | Status: AC
  Filled 2023-01-17: qty 250

## 2023-01-17 MED ORDER — ACETAMINOPHEN 10 MG/ML IV SOLN
INTRAVENOUS | Status: AC
Start: 2023-01-17 — End: ?
  Filled 2023-01-17: qty 100

## 2023-01-17 MED ORDER — PRENATAL MULTIVITAMIN CH
1.0000 | ORAL_TABLET | Freq: Every day | ORAL | Status: DC
  Administered 2023-01-17 – 2023-01-19 (×3): 1 via ORAL
  Filled 2023-01-17 (×3): qty 1

## 2023-01-17 MED ORDER — DIPHENHYDRAMINE HCL 25 MG PO CAPS: 25.0000 mg | ORAL_CAPSULE | ORAL | Status: DC | PRN

## 2023-01-17 MED ORDER — AMISULPRIDE (ANTIEMETIC) 5 MG/2ML IV SOLN: 10.0000 mg | Freq: Once | INTRAVENOUS | Status: DC | PRN

## 2023-01-17 MED ORDER — SIMETHICONE 80 MG PO CHEW
80.0000 mg | CHEWABLE_TABLET | ORAL | Status: DC | PRN
Start: 2023-01-17 — End: 2023-01-19

## 2023-01-17 MED ORDER — DIBUCAINE (PERIANAL) 1 % EX OINT: 1.0000 | TOPICAL_OINTMENT | CUTANEOUS | Status: DC | PRN

## 2023-01-17 SURGICAL SUPPLY — 34 items
ADH SKN CLS APL DERMABOND .7 (GAUZE/BANDAGES/DRESSINGS)
APL PRP STRL LF DISP 70% ISPRP (MISCELLANEOUS) ×2
APL SKNCLS STERI-STRIP NONHPOA (GAUZE/BANDAGES/DRESSINGS) ×1
BENZOIN TINCTURE PRP APPL 2/3 (GAUZE/BANDAGES/DRESSINGS) ×2 IMPLANT
CHLORAPREP W/TINT 26 (MISCELLANEOUS) ×4 IMPLANT
CLAMP UMBILICAL CORD (MISCELLANEOUS) ×2 IMPLANT
CLOTH BEACON ORANGE TIMEOUT ST (SAFETY) ×2 IMPLANT
DERMABOND ADVANCED .7 DNX12 (GAUZE/BANDAGES/DRESSINGS) IMPLANT
DRSG OPSITE POSTOP 4X10 (GAUZE/BANDAGES/DRESSINGS) ×2 IMPLANT
ELECT REM PT RETURN 9FT ADLT (ELECTROSURGICAL) ×1
ELECTRODE REM PT RTRN 9FT ADLT (ELECTROSURGICAL) ×2 IMPLANT
EXTRACTOR VACUUM KIWI (MISCELLANEOUS) IMPLANT
GLOVE BIO SURGEON STRL SZ 6 (GLOVE) ×2 IMPLANT
GLOVE BIOGEL PI IND STRL 6 (GLOVE) ×4 IMPLANT
GLOVE BIOGEL PI IND STRL 7.0 (GLOVE) ×2 IMPLANT
GOWN STRL REUS W/TWL LRG LVL3 (GOWN DISPOSABLE) ×4 IMPLANT
KIT ABG SYR 3ML LUER SLIP (SYRINGE) ×2 IMPLANT
MAT PREVALON FULL STRYKER (MISCELLANEOUS) IMPLANT
NDL HYPO 25X5/8 SAFETYGLIDE (NEEDLE) ×2 IMPLANT
NEEDLE HYPO 25X5/8 SAFETYGLIDE (NEEDLE) ×1 IMPLANT
NS IRRIG 1000ML POUR BTL (IV SOLUTION) ×2 IMPLANT
PACK C SECTION WH (CUSTOM PROCEDURE TRAY) ×2 IMPLANT
PAD OB MATERNITY 4.3X12.25 (PERSONAL CARE ITEMS) ×2 IMPLANT
RETRACTOR TRAXI PANNICULUS (MISCELLANEOUS) IMPLANT
STRIP CLOSURE SKIN 1/2X4 (GAUZE/BANDAGES/DRESSINGS) IMPLANT
SUT CHROMIC 0 CTX 36 (SUTURE) ×6 IMPLANT
SUT MON AB 2-0 CT1 27 (SUTURE) ×2 IMPLANT
SUT PDS AB 0 CT1 27 (SUTURE) IMPLANT
SUT PLAIN 0 NONE (SUTURE) IMPLANT
SUT VIC AB 0 CT1 36 (SUTURE) IMPLANT
SUT VIC AB 4-0 KS 27 (SUTURE) IMPLANT
TOWEL OR 17X24 6PK STRL BLUE (TOWEL DISPOSABLE) ×2 IMPLANT
TRAY FOLEY W/BAG SLVR 14FR LF (SET/KITS/TRAYS/PACK) IMPLANT
WATER STERILE IRR 1000ML POUR (IV SOLUTION) ×2 IMPLANT

## 2023-01-17 NOTE — Anesthesia Preprocedure Evaluation (Addendum)
Anesthesia Evaluation  Patient identified by MRN, date of birth, ID band Patient awake    Reviewed: Allergy & Precautions, NPO status , Patient's Chart, lab work & pertinent test results, reviewed documented beta blocker date and time   History of Anesthesia Complications Negative for: history of anesthetic complications  Airway Mallampati: II   Neck ROM: Full    Dental  (+) Teeth Intact   Pulmonary former smoker   Pulmonary exam normal        Cardiovascular hypertension, Pt. on medications and Pt. on home beta blockers + DVT  Normal cardiovascular exam     Neuro/Psych  PSYCHIATRIC DISORDERS Anxiety Depression    negative neurological ROS     GI/Hepatic negative GI ROS, Neg liver ROS,,,  Endo/Other    Morbid obesity  Renal/GU negative Renal ROS     Musculoskeletal negative musculoskeletal ROS (+)    Abdominal  (+) + obese  Peds  Hematology  (+) Blood dyscrasia, anemia  Plt 153k    Anesthesia Other Findings Antiphospholipid syndrome  Reproductive/Obstetrics (+) Pregnancy  PCOS Hx Pre-E                              Anesthesia Physical Anesthesia Plan  ASA: 3  Anesthesia Plan: Spinal   Post-op Pain Management: Tylenol PO (pre-op)*   Induction:   PONV Risk Score and Plan: 2 and Treatment may vary due to age or medical condition, Ondansetron and Scopolamine patch - Pre-op  Airway Management Planned: Natural Airway  Additional Equipment: None  Intra-op Plan:   Post-operative Plan:   Informed Consent: I have reviewed the patients History and Physical, chart, labs and discussed the procedure including the risks, benefits and alternatives for the proposed anesthesia with the patient or authorized representative who has indicated his/her understanding and acceptance.       Plan Discussed with: CRNA and Anesthesiologist  Anesthesia Plan Comments: (Labs reviewed, platelets  acceptable. Discussed risks and benefits of spinal, including spinal/epidural hematoma, infection, failed block, and PDPH. Patient expressed understanding and wished to proceed. )        Anesthesia Quick Evaluation

## 2023-01-17 NOTE — Anesthesia Procedure Notes (Signed)
Spinal  Patient location during procedure: OR Start time: 01/17/2023 7:35 AM End time: 01/17/2023 7:38 AM Reason for block: surgical anesthesia Staffing Performed: anesthesiologist  Anesthesiologist: Beryle Lathe, MD Performed by: Beryle Lathe, MD Authorized by: Beryle Lathe, MD   Preanesthetic Checklist Completed: patient identified, IV checked, risks and benefits discussed, surgical consent, monitors and equipment checked, pre-op evaluation and timeout performed Spinal Block Patient position: sitting Prep: DuraPrep Patient monitoring: heart rate, cardiac monitor, continuous pulse ox and blood pressure Approach: midline Location: L2-3 Injection technique: single-shot Needle Needle type: Pencan  Needle gauge: 24 G Additional Notes Consent was obtained prior to the procedure with all questions answered and concerns addressed. Risks including, but not limited to, bleeding, infection, nerve damage, paralysis, failed block, inadequate analgesia, allergic reaction, high spinal, itching, and headache were discussed and the patient wished to proceed. Functioning IV was confirmed and monitors were applied. Sterile prep and drape, including hand hygiene, mask, and sterile gloves were used. The patient was positioned and the spine was prepped. The skin was anesthetized with lidocaine. Free flow of clear CSF was obtained prior to injecting local anesthetic into the CSF. The spinal needle aspirated freely following injection. The needle was carefully withdrawn. The patient tolerated the procedure well.   Leslye Peer, MD

## 2023-01-17 NOTE — Anesthesia Postprocedure Evaluation (Signed)
Anesthesia Post Note  Patient: MARAI MAROS  Procedure(s) Performed: CESAREAN SECTION  WITH BILATERAL TUBAL LIGATION REPEAT X1 (Bilateral)     Patient location during evaluation: PACU Anesthesia Type: Spinal Level of consciousness: awake and alert Pain management: pain level controlled Vital Signs Assessment: post-procedure vital signs reviewed and stable Respiratory status: spontaneous breathing and respiratory function stable Cardiovascular status: blood pressure returned to baseline and stable Postop Assessment: spinal receding and no apparent nausea or vomiting Anesthetic complications: no   No notable events documented.  Last Vitals:  Vitals:   01/17/23 1018 01/17/23 1142  BP: 119/72 129/80  Pulse: (!) 51 (!) 57  Resp: 14 18  Temp: 36.6 C 36.9 C  SpO2: 100% 98%    Last Pain:  Vitals:   01/17/23 1142  TempSrc: Oral  PainSc: 0-No pain   Pain Goal: Patients Stated Pain Goal: 7 (01/17/23 1000)                 Beryle Lathe

## 2023-01-17 NOTE — Lactation Note (Signed)
This note was copied from a baby's chart. Lactation Consultation Note  Patient Name: Nancy Rivera [id number] Date: 01/17/2023 Age:39 hours Reason for consult: Initial assessment;Early term 37-38.6wks  P3- MOB stated that she is going to formula feed infant only. MOB breast fed her older two children and states that it became "too much", so for her mental health she decided to not try this time around.  LC reviewed engorgement/breast care and milk suppression. LC encouraged MOB to call for any assistance needed.  Feeding Mother's Current Feeding Choice: Formula  Consult Status Consult Status: Complete (mother declined follow up) Date: 01/17/23    Dema Severin BS, IBCLC 01/17/2023, 7:49 PM

## 2023-01-17 NOTE — Progress Notes (Signed)
No change in H&P. Last heparin dose Monday 1830.  [name], DO

## 2023-01-17 NOTE — Transfer of Care (Signed)
Immediate Anesthesia Transfer of Care Note  Patient: Nancy Rivera  Procedure(s) Performed: CESAREAN SECTION  WITH BILATERAL TUBAL LIGATION REPEAT X1 (Bilateral)  Patient Location: PACU  Anesthesia Type:Spinal  Level of Consciousness: awake  Airway & Oxygen Therapy: Patient Spontanous Breathing  Post-op Assessment: Report given to RN and Post -op Vital signs reviewed and stable  Post vital signs: Reviewed and stable  Last Vitals:  Vitals Value Taken Time  BP 113/73 01/17/23 0912  Temp    Pulse 68 01/17/23 0913  Resp 9 01/17/23 0913  SpO2 96 % 01/17/23 0913  Vitals shown include unfiled device data.  Last Pain:  Vitals:   01/17/23 0558  TempSrc: Oral  PainSc: 0-No pain         Complications: No notable events documented.

## 2023-01-17 NOTE — Op Note (Signed)
Nancy Rivera PROCEDURE DATE: 01/17/2023  PREOPERATIVE DIAGNOSIS: Intrauterine pregnancy at  [redacted]w[redacted]d weeks gestation, CHTN, lupus anticoagulant, PT gene mutation heterozygote, history of DVT, desire for sterility  POSTOPERATIVE DIAGNOSIS: The same  PROCEDURE:  Repeat Low Transverse Cesarean Section with Bilateral Tubal Ligation  SURGEON:  Dr. [name]  INDICATIONS: Nancy Rivera is a 37 y.o. [id number] at [redacted]w[redacted]d scheduled for cesarean section secondary to above.  The risks of cesarean section discussed with the patient included but were not limited to: bleeding which may require transfusion or reoperation; infection which may require antibiotics; injury to bowel, bladder, ureters or other surrounding organs; injury to the fetus; need for additional procedures including hysterectomy in the event of a life-threatening hemorrhage; placental abnormalities wth subsequent pregnancies, incisional problems, thromboembolic phenomenon and other postoperative/anesthesia complications. The patient concurred with the proposed plan, giving informed written consent for the procedure.    FINDINGS:  Viable female infant in cephalic presentation, APGARs 9,9: weight pending  Clear amniotic fluid.  Intact placenta, three vessel cord.  Grossly normal uterus, ovaries and fallopian tubes. .   ANESTHESIA:  Spinal ESTIMATED BLOOD LOSS: 196 ml SPECIMENS: Placenta sent to L&D, bilateral tubal segments sent to pathology COMPLICATIONS: None immediate  PROCEDURE IN DETAIL:  The patient received intravenous antibiotics and had sequential compression devices applied to her lower extremities while in the preoperative area.  She was then taken to the operating room where spinal anesthesia was administered and was found to be adequate. She was then placed in a dorsal supine position with a leftward tilt, and prepped and draped in a sterile manner.  A foley catheter was placed into her bladder and attached to constant gravity.   After an adequate timeout was performed, a Pfannenstiel skin incision was made with scalpel and carried through to the underlying layer of fascia. The fascia was incised in the midline and this incision was extended bilaterally using the Mayo scissors. Kocher clamps were applied to the superior aspect of the fascial incision and the underlying rectus muscles were dissected off bluntly. A similar process was carried out on the inferior aspect of the facial incision. The rectus muscles were separated in the midline bluntly and the peritoneum was entered bluntly.   A transverse hysterotomy was made with a scalpel and extended bilaterally bluntly. The bladder blade was then removed. The infant was successfully delivered, and cord was clamped and cut and infant was handed over to awaiting neonatology team. Uterine massage was then administered and the placenta delivered intact with three-vessel cord. The uterus was cleared of clot and debris.  The hysterotomy was closed with 0 chromic.  A second imbricating suture of 0-chromic was used to reinforce the incision and aid in hemostasis.  Attention was turned to the fallopian tubes.  The right fallopian tube was elevated and doubly tied with plain gut.  The tubal knuckle was excised with excellent hemostasis noted.  The contralateral tube was treated in the same fashion.  The peritoneum and rectus muscles were noted to be hemostatic and were reapproximated using 2-0 monocryl in a running fashion.  The fascia was closed with 0-PDS in a running fashion with good restoration of anatomy.  The subcutaneus tissue was copiously irrigated.  The skin was closed with 4-0 vicryl in a subcuticular fashion.  Benzoin, steri-strips and honeycomb dressing applied.  Pt tolerated the procedure will.  All counts were correct x2.  Pt went to the recovery room in stable condition.

## 2023-01-17 NOTE — Progress Notes (Signed)
Removed patient from etco2 and continuous pulse ox per her request. Patient will use her own cpap during the night. Central monitoring notified.

## 2023-01-18 ENCOUNTER — Encounter (HOSPITAL_COMMUNITY): Payer: Self-pay | Admitting: Obstetrics & Gynecology

## 2023-01-18 LAB — CBC
HCT: 30.8 % — ABNORMAL LOW (ref 36.0–46.0)
Hemoglobin: 9.9 g/dL — ABNORMAL LOW (ref 12.0–15.0)
MCH: 26.8 pg (ref 26.0–34.0)
MCHC: 32.1 g/dL (ref 30.0–36.0)
MCV: 83.5 fL (ref 80.0–100.0)
Platelets: 121 10*3/uL — ABNORMAL LOW (ref 150–400)
RBC: 3.69 MIL/uL — ABNORMAL LOW (ref 3.87–5.11)
RDW: 14.6 % (ref 11.5–15.5)
WBC: 11.9 10*3/uL — ABNORMAL HIGH (ref 4.0–10.5)
nRBC: 0 % (ref 0.0–0.2)

## 2023-01-18 NOTE — Progress Notes (Signed)
Subjective: Postpartum Day 1: Cesarean Delivery Patient reports tolerating PO, + flatus, and no problems voiding.   No leg pain, no SOB, no CT Objective: Vital signs in last 24 hours: Temp:  [97.8 F (36.6 C)-98.6 F (37 C)] 98.1 F (36.7 C) (10/24 0602) Pulse Rate:  [51-80] 64 (10/24 0635) Resp:  [0-21] 16 (10/24 0602) BP: (113-140)/(61-88) 133/75 (10/24 0635) SpO2:  [95 %-100 %] 97 % (10/24 0602)  Physical Exam:  General: alert, cooperative, and no distress Lochia: appropriate Uterine Fundus: firm Incision: healing well DVT Evaluation: No evidence of DVT seen on physical exam.  Recent Labs    01/17/23 1130 01/18/23 0440  HGB 11.4* 9.9*  HCT 34.2* 30.8*    Assessment/Plan: Status post Cesarean section. Doing well postoperatively.  Continue current care. -LAC+/PT mutation heterozygous/Hx of DVT x 2  Lovenox 150 mg q 12 hours started last pm -CHTN-labetalol 50mg  BID -anxiety-sertraline -sleep apnea-CPAP D/W circumcision when cleared by peds. Risks discussed and all questions answered.  Roselle Locus II, MD 01/18/2023, 8:11 AM

## 2023-01-18 NOTE — Progress Notes (Signed)
MOB was referred for history of anxiety.  * Referral screened out by Clinical Social Worker because none of the following criteria appear to apply:  ~ History of anxiety during this pregnancy, or of post-partum depression following prior delivery.  ~ Diagnosis of anxiety within last 3 years  OR  * MOB's symptoms currently being treated with medication and/or therapy.  Per OB notes, MOB is prescribed Zoloft 50mg  for support her symptoms.  Please contact the Clinical Social Worker if needs arise, by [facility] request, or if MOB scores greater than 9/yes to question 10 on Edinburgh Postpartum Depression Screen.   Enos Fling, Theresia Majors Clinical Social Worker [phone]

## 2023-01-19 MED ORDER — LABETALOL HCL 200 MG PO TABS: 200.0000 mg | ORAL_TABLET | Freq: Two times a day (BID) | ORAL | 0 refills | Status: AC

## 2023-01-19 MED ORDER — OXYCODONE HCL 5 MG PO TABS: 5.0000 mg | ORAL_TABLET | ORAL | 0 refills | Status: AC | PRN

## 2023-01-19 MED ORDER — IBUPROFEN 600 MG PO TABS: 600.0000 mg | ORAL_TABLET | Freq: Four times a day (QID) | ORAL | 0 refills | Status: AC

## 2023-01-19 MED ORDER — LABETALOL HCL 200 MG PO TABS
200.0000 mg | ORAL_TABLET | Freq: Two times a day (BID) | ORAL | Status: DC
  Administered 2023-01-19: 200 mg via ORAL
  Filled 2023-01-19: qty 1

## 2023-01-19 MED ORDER — FERROUS SULFATE 325 (65 FE) MG PO TABS: 325.0000 mg | ORAL_TABLET | Freq: Every day | ORAL | 0 refills | Status: AC

## 2023-01-19 MED ORDER — ENOXAPARIN SODIUM 150 MG/ML IJ SOSY: 1.0000 mg/kg | PREFILLED_SYRINGE | Freq: Two times a day (BID) | INTRAMUSCULAR | 0 refills | Status: AC

## 2023-01-19 MED ORDER — DOCUSATE SODIUM 100 MG PO CAPS: 100.0000 mg | ORAL_CAPSULE | Freq: Every day | ORAL | 0 refills | Status: AC

## 2023-01-19 MED ORDER — ACETAMINOPHEN 325 MG PO TABS: 650.0000 mg | ORAL_TABLET | Freq: Four times a day (QID) | ORAL | 0 refills | Status: AC | PRN

## 2023-01-19 NOTE — Discharge Summary (Signed)
Obstetric Discharge Summary  Nancy Rivera is a 37 y.o. female that presented on 01/17/2023 for scheduled repeat C section.  She has a relevant history of DVT following delivery in 2022 2 months postpartum on estrogen containing birth control.  She was positive for lupus anticoagulant and heterozygous for prothrombin gene mutation. She had been on anticoagulation with coumadin which was stopped with positive pregnancy tets for current pregnancy.  She has been on therapeutic Lovenox during current pregnancy and transitioned to heparin near term.    Her delivery was uncomplicated and she restarted therapeutic lovenox on POD#1. She delivered a viable female infant.  She also has a history of chronic hypertension on labetalol.   Her postpartum course was uncomplicated and on PPD#2, she reported well controlled pain, spontaneous voiding, ambulating without difficulty, and tolerating PO.  She was stable for discharge home on 01/19/2023 with plans for in-office follow up for a 1 week blood pressure check and incision check.   Hemoglobin  Date Value Ref Range Status  01/18/2023 9.9 (L) 12.0 - 15.0 g/dL Final  16/12/9602 54.0 12.0 - 15.0 g/dL Final   HCT  Date Value Ref Range Status  01/18/2023 30.8 (L) 36.0 - 46.0 % Final    Physical Exam:  General: alert and no distress Lochia: appropriate Uterine Fundus: firm Incision: healing well DVT Evaluation: No evidence of DVT seen on physical exam.  Discharge Diagnoses: Term Pregnancy-delivered, Hx of DVT, chTN  Discharge Information: Date: 01/19/2023 Activity: Pelvic rest, as tolerated Diet: routine Medications: Tylenol, motrin, oxycodone Condition: stable Instructions: Refer to practice specific booklet.  Discussed prior to discharge.  Discharge to: Home  Follow-up Information     [place] Follow up.   Why: Please follow up for a 1 week blood pressure and incision check. Contact information: [address] [place] [address] [phone]                 Newborn Data: Live born female  Birth Weight: 6 lb 11.2 oz (3040 g) APGAR: 9, 9  Newborn Delivery   Birth date/time: 01/17/2023 08:10:00 Delivery type: C-Section, Low Transverse Trial of labor: No C-section categorization: Repeat     Home with mother.  Lyn Henri 01/19/2023, 8:40 PM

## 2023-01-19 NOTE — Progress Notes (Signed)
Postpartum Progress Note  Postpartum Day 2 s/p repeat Cesarean section.  Subjective:  Patient reports no overnight events.  She reports well controlled pain, ambulating without difficulty, voiding spontaneously, tolerating PO.  She reports Positive flatus, Positive BM.  Vaginal bleeding is minimal.  Objective: Blood pressure (!) 144/80, pulse 85, temperature 98.1 F (36.7 C), temperature source Oral, resp. rate 16, height 5\' 9"  (1.753 m), weight (!) 150.3 kg, last menstrual period 05/02/2022, SpO2 98%, unknown if currently breastfeeding.  Physical Exam:  General: alert and no distress Lochia: appropriate Abdomen: soft, ATTP Uterine Fundus: firm Incision: dressing in place DVT Evaluation: No evidence of DVT seen on physical exam.  Recent Labs    01/17/23 1130 01/18/23 0440  HGB 11.4* 9.9*  HCT 34.2* 30.8*    Assessment/Plan: Postpartum Day 2, s/p C-section Hx of DVT and hypercoagulable state Continue Lovenox 150 mg q12h, plan follow up with hematology cHTN - currently on low dose of 50 mg labetalol BID, will increase given slightly elevated BP.  Begin 200 mg labetalol BID Continue sertraline for anxiety Baby boy s/p circ Lactation following Discussed bleeding and DVT precautions.  Doing well, continue routine postpartum care. Anticipate discharge home today   LOS: 2 days   Nancy Rivera 01/19/2023, 7:55 AM

## 2023-01-20 ENCOUNTER — Inpatient Hospital Stay (HOSPITAL_COMMUNITY)
Admission: AD | Admit: 2023-01-20 | Discharge: 2023-01-20 | Disposition: A | Discharge status: Home / Self Care | Payer: 59 | Attending: Obstetrics and Gynecology | Admitting: Obstetrics and Gynecology

## 2023-01-20 ENCOUNTER — Other Ambulatory Visit: Payer: Self-pay

## 2023-01-20 ENCOUNTER — Encounter (HOSPITAL_COMMUNITY): Payer: Self-pay | Admitting: Obstetrics and Gynecology

## 2023-01-20 DIAGNOSIS — O902 Hematoma of obstetric wound: Secondary | ICD-10-CM | POA: Insufficient documentation

## 2023-01-20 NOTE — MAU Provider Note (Signed)
Event Date/Time   First Provider Initiated Contact with Patient 01/20/23 1634     S Ms. Nancy Rivera is a 37 y.o. [phone] female on POD#3 from a rCS who presents to MAU today with complaint of bleeding incision site - had not noted much drainage at all on her honeycomb dressing. She had been sitting on the couch, got up and noted her entire dressing was soaked in blood - had just checked it about an hour prior. Denies excessive cramping, discharge with foul odor or excessive pain. No other physical complaints.   Receives care at [facility]. Prenatal records reviewed.  Pertinent items noted in HPI and remainder of comprehensive ROS otherwise negative.   O BP 134/80 (BP Location: Right Arm)   Pulse 88   Temp 98.6 F (37 C) (Oral)   Resp 16   SpO2 99%  Physical Exam Vitals and nursing note reviewed.  Constitutional:      General: She is not in acute distress.    Appearance: Normal appearance. She is not ill-appearing.  HENT:     Head: Normocephalic.  Cardiovascular:     Rate and Rhythm: Normal rate and regular rhythm.  Pulmonary:     Effort: Pulmonary effort is normal.  Abdominal:     General: There is no distension.     Palpations: Abdomen is soft.     Tenderness: There is no abdominal tenderness. There is no guarding.    Musculoskeletal:        General: Normal range of motion.  Skin:    General: Skin is warm and dry.     Capillary Refill: Capillary refill takes less than 2 seconds.  Neurological:     Mental Status: She is alert and oriented to person, place, and time.  Psychiatric:        Mood and Affect: Mood normal.        Behavior: Behavior normal.    MDM: Low MAU Course: Soiled dressing and steri strips removed, area cleaned with sterile water and gauze pads. No active bleeding noted but evidence of a hematoma present just behind part of the incision which was overall well approximated and closed. Replaced steri strips and applied an ABD  pad. Reassurance given.  A 1. Wound hematoma following cesarean section, postpartum - Discharge patient  P Discharge from MAU in stable condition with post-op precautions Follow up at [facility] as scheduled for ongoing prenatal care  Allergies as of 01/20/2023   No Known Allergies      Medication List     TAKE these medications    acetaminophen 325 MG tablet Commonly known as: Tylenol Take 2 tablets (650 mg total) by mouth every 6 (six) hours as needed.   docusate sodium 100 MG capsule Commonly known as: Colace Take 1 capsule (100 mg total) by mouth daily.   enoxaparin 150 MG/ML injection Commonly known as: LOVENOX Inject 1 mL (150 mg total) into the skin every 12 (twelve) hours. What changed:  how much to take additional instructions   ferrous sulfate 325 (65 FE) MG tablet Take 1 tablet (325 mg total) by mouth daily with breakfast.   ibuprofen 600 MG tablet Commonly known as: ADVIL Take 1 tablet (600 mg total) by mouth every 6 (six) hours.   labetalol 200 MG tablet Commonly known as: NORMODYNE Take 1 tablet (200 mg total) by mouth 2 (two) times daily.   oxyCODONE 5 MG immediate release tablet Commonly known as: Oxy IR/ROXICODONE Take  1 tablet (5 mg total) by mouth every 4 (four) hours as needed for up to 5 days for moderate pain (pain score 4-6).   prenatal multivitamin Tabs tablet Take 1 tablet by mouth daily.   sertraline 50 MG tablet Commonly known as: ZOLOFT Take 50 mg by mouth at bedtime.       Edd Arbour, CNM, MSN, IBCLC Certified Nurse Midwife, [facility]

## 2023-01-20 NOTE — MAU Note (Signed)
Nancy Rivera is a 37 y.o. at Unknown here in MAU reporting: bloody drainage that has saturated her initial post-op honeycomb dressing. Patient denies any increase in abdominal pain. Patient reports vaginal bleeding is minimal.   Onset of complaint: 1400 today Pain score: 1 Vitals:   01/20/23 1532 01/20/23 1537  BP:  (!) 141/80  Pulse:  86  Resp:  18  Temp: 98.6 F (37 C)   SpO2:  100%

## 2023-01-22 LAB — SURGICAL PATHOLOGY

## 2023-02-06 ENCOUNTER — Telehealth (HOSPITAL_COMMUNITY): Payer: Self-pay | Admitting: *Deleted

## 2023-02-06 NOTE — Telephone Encounter (Signed)
02/06/2023  Name: Nancy Rivera MRN: [id number] DOB: Jan 13, 1986  Reason for Call:  Transition of Care Hospital Discharge Call  Contact Status: Patient Contact Status: Message  Language assistant needed:          Follow-Up Questions:    Nancy Rivera Postnatal Depression Scale:  In the Past 7 Days:    PHQ2-9 Depression Scale:     Discharge Follow-up:    Post-discharge interventions: NA  Nancy Acklin,RN  02/06/2023 1716

## 2023-07-26 ENCOUNTER — Other Ambulatory Visit (HOSPITAL_BASED_OUTPATIENT_CLINIC_OR_DEPARTMENT_OTHER): Payer: Self-pay
